# Patient Record
Sex: Male | Born: 1940 | Race: White | Hispanic: No | Marital: Married | Smoking: Former smoker
Health system: Southern US, Community
[De-identification: ages and names within clinical notes are randomized; demographics above are authoritative.]

## PROBLEM LIST (undated history)

## (undated) DIAGNOSIS — I10 Essential (primary) hypertension: Secondary | ICD-10-CM

## (undated) DIAGNOSIS — I2510 Atherosclerotic heart disease of native coronary artery without angina pectoris: Secondary | ICD-10-CM

## (undated) DIAGNOSIS — D649 Anemia, unspecified: Secondary | ICD-10-CM

## (undated) DIAGNOSIS — E039 Hypothyroidism, unspecified: Secondary | ICD-10-CM

## (undated) DIAGNOSIS — D689 Coagulation defect, unspecified: Secondary | ICD-10-CM

## (undated) DIAGNOSIS — G4733 Obstructive sleep apnea (adult) (pediatric): Secondary | ICD-10-CM

## (undated) DIAGNOSIS — E785 Hyperlipidemia, unspecified: Secondary | ICD-10-CM

## (undated) DIAGNOSIS — Z800 Family history of malignant neoplasm of digestive organs: Secondary | ICD-10-CM

## (undated) DIAGNOSIS — L409 Psoriasis, unspecified: Secondary | ICD-10-CM

## (undated) DIAGNOSIS — T7840XA Allergy, unspecified, initial encounter: Secondary | ICD-10-CM

## (undated) DIAGNOSIS — H269 Unspecified cataract: Secondary | ICD-10-CM

## (undated) DIAGNOSIS — K635 Polyp of colon: Secondary | ICD-10-CM

## (undated) DIAGNOSIS — H6120 Impacted cerumen, unspecified ear: Secondary | ICD-10-CM

## (undated) DIAGNOSIS — I219 Acute myocardial infarction, unspecified: Secondary | ICD-10-CM

## (undated) DIAGNOSIS — M1990 Unspecified osteoarthritis, unspecified site: Secondary | ICD-10-CM

## (undated) DIAGNOSIS — K5790 Diverticulosis of intestine, part unspecified, without perforation or abscess without bleeding: Secondary | ICD-10-CM

## (undated) DIAGNOSIS — G4730 Sleep apnea, unspecified: Secondary | ICD-10-CM

## (undated) HISTORY — DX: Anemia, unspecified: D64.9

## (undated) HISTORY — PX: POLYPECTOMY: SHX149

## (undated) HISTORY — DX: Unspecified cataract: H26.9

## (undated) HISTORY — DX: Coagulation defect, unspecified: D68.9

## (undated) HISTORY — DX: Sleep apnea, unspecified: G47.30

## (undated) HISTORY — DX: Impacted cerumen, unspecified ear: H61.20

## (undated) HISTORY — DX: Family history of malignant neoplasm of digestive organs: Z80.0

## (undated) HISTORY — DX: Hypothyroidism, unspecified: E03.9

## (undated) HISTORY — DX: Psoriasis, unspecified: L40.9

## (undated) HISTORY — PX: COLONOSCOPY: SHX174

## (undated) HISTORY — DX: Acute myocardial infarction, unspecified: I21.9

## (undated) HISTORY — PX: TONSILLECTOMY: SUR1361

## (undated) HISTORY — DX: Diverticulosis of intestine, part unspecified, without perforation or abscess without bleeding: K57.90

## (undated) HISTORY — DX: Unspecified osteoarthritis, unspecified site: M19.90

## (undated) HISTORY — PX: PTCA: SHX146

## (undated) HISTORY — DX: Obstructive sleep apnea (adult) (pediatric): G47.33

## (undated) HISTORY — DX: Atherosclerotic heart disease of native coronary artery without angina pectoris: I25.10

## (undated) HISTORY — DX: Polyp of colon: K63.5

## (undated) HISTORY — DX: Allergy, unspecified, initial encounter: T78.40XA

## (undated) HISTORY — DX: Hyperlipidemia, unspecified: E78.5

## (undated) HISTORY — DX: Essential (primary) hypertension: I10

---

## 1991-04-27 DIAGNOSIS — I219 Acute myocardial infarction, unspecified: Secondary | ICD-10-CM

## 1991-04-27 HISTORY — PX: CARDIAC CATHETERIZATION: SHX172

## 1991-04-27 HISTORY — DX: Acute myocardial infarction, unspecified: I21.9

## 2000-07-05 ENCOUNTER — Ambulatory Visit (HOSPITAL_BASED_OUTPATIENT_CLINIC_OR_DEPARTMENT_OTHER): Admission: RE | Admit: 2000-07-05 | Discharge: 2000-07-05 | Payer: Self-pay | Admitting: Pulmonary Disease

## 2001-02-27 ENCOUNTER — Ambulatory Visit (HOSPITAL_COMMUNITY): Admission: RE | Admit: 2001-02-27 | Discharge: 2001-02-27 | Payer: Self-pay | Admitting: Internal Medicine

## 2001-02-27 ENCOUNTER — Encounter: Payer: Self-pay | Admitting: Internal Medicine

## 2004-03-30 ENCOUNTER — Ambulatory Visit: Payer: Self-pay | Admitting: Internal Medicine

## 2004-04-07 ENCOUNTER — Ambulatory Visit: Payer: Self-pay | Admitting: Internal Medicine

## 2005-02-09 ENCOUNTER — Ambulatory Visit: Payer: Self-pay

## 2005-03-30 ENCOUNTER — Ambulatory Visit: Payer: Self-pay | Admitting: Internal Medicine

## 2005-04-08 ENCOUNTER — Ambulatory Visit: Payer: Self-pay | Admitting: Internal Medicine

## 2005-04-14 ENCOUNTER — Ambulatory Visit: Payer: Self-pay | Admitting: Cardiology

## 2005-04-16 ENCOUNTER — Ambulatory Visit (HOSPITAL_COMMUNITY): Admission: RE | Admit: 2005-04-16 | Discharge: 2005-04-16 | Payer: Self-pay | Admitting: Internal Medicine

## 2005-07-26 ENCOUNTER — Ambulatory Visit: Payer: Self-pay | Admitting: Gastroenterology

## 2005-08-11 ENCOUNTER — Ambulatory Visit: Payer: Self-pay | Admitting: Gastroenterology

## 2005-09-28 ENCOUNTER — Ambulatory Visit: Payer: Self-pay | Admitting: Cardiology

## 2005-09-28 ENCOUNTER — Inpatient Hospital Stay (HOSPITAL_COMMUNITY): Admission: EM | Admit: 2005-09-28 | Discharge: 2005-09-30 | Payer: Self-pay | Admitting: Emergency Medicine

## 2005-10-15 ENCOUNTER — Ambulatory Visit: Payer: Self-pay | Admitting: Cardiology

## 2005-10-20 ENCOUNTER — Ambulatory Visit: Payer: Self-pay | Admitting: Cardiology

## 2005-10-21 ENCOUNTER — Encounter (HOSPITAL_COMMUNITY): Admission: RE | Admit: 2005-10-21 | Discharge: 2006-01-19 | Payer: Self-pay | Admitting: Cardiology

## 2005-12-09 ENCOUNTER — Ambulatory Visit: Payer: Self-pay | Admitting: Cardiology

## 2005-12-15 ENCOUNTER — Ambulatory Visit: Payer: Self-pay | Admitting: Cardiology

## 2006-01-20 ENCOUNTER — Encounter (HOSPITAL_COMMUNITY): Admission: RE | Admit: 2006-01-20 | Discharge: 2006-01-24 | Payer: Self-pay | Admitting: Cardiology

## 2006-01-25 ENCOUNTER — Encounter (HOSPITAL_COMMUNITY): Admission: RE | Admit: 2006-01-25 | Discharge: 2006-04-25 | Payer: Self-pay | Admitting: Cardiology

## 2006-02-03 ENCOUNTER — Ambulatory Visit: Payer: Self-pay | Admitting: Cardiology

## 2006-02-14 ENCOUNTER — Ambulatory Visit: Payer: Self-pay | Admitting: Internal Medicine

## 2006-02-14 LAB — CONVERTED CEMR LAB
ALT: 31 units/L (ref 0–40)
AST: 30 units/L (ref 0–37)
Albumin: 3.8 g/dL (ref 3.5–5.2)
Alkaline Phosphatase: 48 units/L (ref 39–117)
BUN: 8 mg/dL (ref 6–23)
Basophils Absolute: 0 10*3/uL (ref 0.0–0.1)
Basophils Relative: 0.7 % (ref 0.0–1.0)
Bilirubin Urine: NEGATIVE
CO2: 31 meq/L (ref 19–32)
Calcium: 9.4 mg/dL (ref 8.4–10.5)
Chloride: 108 meq/L (ref 96–112)
Chol/HDL Ratio, serum: 3.1
Cholesterol: 102 mg/dL (ref 0–200)
Creatinine, Ser: 1.1 mg/dL (ref 0.4–1.5)
Eosinophil percent: 2 % (ref 0.0–5.0)
GFR calc non Af Amer: 72 mL/min
Glomerular Filtration Rate, Af Am: 87 mL/min/{1.73_m2}
Glucose, Bld: 91 mg/dL (ref 70–99)
HCT: 43.2 % (ref 39.0–52.0)
HDL: 32.8 mg/dL — ABNORMAL LOW (ref >39.0)
Hemoglobin: 14.3 g/dL (ref 13.0–17.0)
Ketones, ur: NEGATIVE mg/dL
LDL Cholesterol: 55 mg/dL (ref 0–99)
Leukocytes, UA: NEGATIVE
Lymphocytes Relative: 32 % (ref 12.0–46.0)
MCHC: 33 g/dL (ref 30.0–36.0)
MCV: 88.2 fL (ref 78.0–100.0)
Monocytes Absolute: 0.5 10*3/uL (ref 0.2–0.7)
Monocytes Relative: 9.8 % (ref 3.0–11.0)
Neutro Abs: 2.7 10*3/uL (ref 1.4–7.7)
Neutrophils Relative %: 55.5 % (ref 43.0–77.0)
Nitrite: NEGATIVE
PSA: 0.57 ng/mL (ref 0.10–4.00)
Platelets: 231 10*3/uL (ref 150–400)
Potassium: 4.7 meq/L (ref 3.5–5.1)
RBC: 4.9 M/uL (ref 4.22–5.81)
RDW: 13 % (ref 11.5–14.6)
Sodium: 143 meq/L (ref 135–145)
Specific Gravity, Urine: 1.015 (ref 1.000–1.03)
TSH: 0.35 microintl units/mL (ref 0.35–5.50)
Total Bilirubin: 1 mg/dL (ref 0.3–1.2)
Total Protein: 6.5 g/dL (ref 6.0–8.3)
Triglyceride fasting, serum: 72 mg/dL (ref 0–149)
Urine Glucose: NEGATIVE mg/dL
Urobilinogen, UA: 0.2 (ref 0.0–1.0)
VLDL: 14 mg/dL (ref 0–40)
WBC: 4.8 10*3/uL (ref 4.5–10.5)
pH: 7 (ref 5.0–8.0)

## 2006-02-16 ENCOUNTER — Ambulatory Visit: Payer: Self-pay | Admitting: Internal Medicine

## 2006-03-21 ENCOUNTER — Ambulatory Visit: Payer: Self-pay | Admitting: Cardiology

## 2006-03-21 LAB — CONVERTED CEMR LAB
ALT: 31 units/L (ref 0–40)
AST: 30 units/L (ref 0–37)
Albumin: 4 g/dL (ref 3.5–5.2)
Alkaline Phosphatase: 48 units/L (ref 39–117)
Bilirubin, Direct: 0.3 mg/dL (ref 0.0–0.3)
Chol/HDL Ratio, serum: 2.8
Cholesterol: 104 mg/dL (ref 0–200)
HDL: 37.4 mg/dL — ABNORMAL LOW (ref >39.0)
LDL Cholesterol: 56 mg/dL (ref 0–99)
Total Bilirubin: 1.4 mg/dL — ABNORMAL HIGH (ref 0.3–1.2)
Total Protein: 6.7 g/dL (ref 6.0–8.3)
Triglyceride fasting, serum: 52 mg/dL (ref 0–149)
VLDL: 10 mg/dL (ref 0–40)

## 2006-03-24 ENCOUNTER — Ambulatory Visit: Payer: Self-pay | Admitting: Cardiology

## 2006-04-13 ENCOUNTER — Ambulatory Visit: Payer: Self-pay

## 2006-04-14 ENCOUNTER — Ambulatory Visit: Payer: Self-pay | Admitting: Cardiology

## 2006-04-26 ENCOUNTER — Encounter (HOSPITAL_COMMUNITY): Admission: RE | Admit: 2006-04-26 | Discharge: 2006-07-25 | Payer: Self-pay | Admitting: Cardiology

## 2006-05-03 ENCOUNTER — Ambulatory Visit (HOSPITAL_COMMUNITY): Admission: RE | Admit: 2006-05-03 | Discharge: 2006-05-03 | Payer: Self-pay | Admitting: Cardiology

## 2006-05-03 ENCOUNTER — Ambulatory Visit: Payer: Self-pay | Admitting: Cardiology

## 2006-05-13 ENCOUNTER — Ambulatory Visit: Payer: Self-pay | Admitting: Cardiology

## 2006-06-16 ENCOUNTER — Ambulatory Visit: Payer: Self-pay | Admitting: Cardiology

## 2006-06-16 LAB — CONVERTED CEMR LAB
ALT: 30 units/L (ref 0–40)
AST: 29 units/L (ref 0–37)
Albumin: 4 g/dL (ref 3.5–5.2)
Alkaline Phosphatase: 52 units/L (ref 39–117)
Bilirubin, Direct: 0.2 mg/dL (ref 0.0–0.3)
Cholesterol: 106 mg/dL (ref 0–200)
HDL: 37.9 mg/dL — ABNORMAL LOW (ref >39.0)
LDL Cholesterol: 51 mg/dL (ref 0–99)
Total Bilirubin: 1.1 mg/dL (ref 0.3–1.2)
Total CHOL/HDL Ratio: 2.8
Total Protein: 7.1 g/dL (ref 6.0–8.3)
Triglycerides: 88 mg/dL (ref 0–149)
VLDL: 18 mg/dL (ref 0–40)

## 2006-06-23 ENCOUNTER — Ambulatory Visit: Payer: Self-pay | Admitting: Cardiology

## 2006-07-26 ENCOUNTER — Encounter (HOSPITAL_COMMUNITY): Admission: RE | Admit: 2006-07-26 | Discharge: 2006-10-24 | Payer: Self-pay | Admitting: Cardiology

## 2006-08-25 ENCOUNTER — Ambulatory Visit: Payer: Self-pay | Admitting: Cardiology

## 2006-08-25 LAB — CONVERTED CEMR LAB
ALT: 37 units/L (ref 0–40)
AST: 34 units/L (ref 0–37)
Albumin: 3.8 g/dL (ref 3.5–5.2)
Alkaline Phosphatase: 51 units/L (ref 39–117)
Bilirubin, Direct: 0.2 mg/dL (ref 0.0–0.3)
Cholesterol: 113 mg/dL (ref 0–200)
HDL: 40.2 mg/dL (ref >39.0)
LDL Cholesterol: 56 mg/dL (ref 0–99)
Total Bilirubin: 1.1 mg/dL (ref 0.3–1.2)
Total CHOL/HDL Ratio: 2.8
Total Protein: 6.5 g/dL (ref 6.0–8.3)
Triglycerides: 84 mg/dL (ref 0–149)
VLDL: 17 mg/dL (ref 0–40)

## 2006-09-22 ENCOUNTER — Ambulatory Visit: Payer: Self-pay | Admitting: Cardiology

## 2006-10-25 ENCOUNTER — Encounter (HOSPITAL_COMMUNITY): Admission: RE | Admit: 2006-10-25 | Discharge: 2006-11-24 | Payer: Self-pay | Admitting: Cardiology

## 2006-11-25 ENCOUNTER — Ambulatory Visit: Payer: Self-pay | Admitting: Cardiology

## 2006-11-25 LAB — CONVERTED CEMR LAB
ALT: 34 units/L (ref 0–53)
AST: 36 units/L (ref 0–37)
Albumin: 3.9 g/dL (ref 3.5–5.2)
Alkaline Phosphatase: 42 units/L (ref 39–117)
Bilirubin, Direct: 0.2 mg/dL (ref 0.0–0.3)
Cholesterol: 103 mg/dL (ref 0–200)
HDL: 37.5 mg/dL — ABNORMAL LOW (ref >39.0)
LDL Cholesterol: 52 mg/dL (ref 0–99)
Total Bilirubin: 1.7 mg/dL — ABNORMAL HIGH (ref 0.3–1.2)
Total CHOL/HDL Ratio: 2.7
Total Protein: 6.6 g/dL (ref 6.0–8.3)
Triglycerides: 66 mg/dL (ref 0–149)
VLDL: 13 mg/dL (ref 0–40)

## 2006-12-01 ENCOUNTER — Ambulatory Visit: Payer: Self-pay | Admitting: Internal Medicine

## 2007-02-02 ENCOUNTER — Encounter: Payer: Self-pay | Admitting: Internal Medicine

## 2007-02-02 DIAGNOSIS — I252 Old myocardial infarction: Secondary | ICD-10-CM

## 2007-02-02 DIAGNOSIS — I2510 Atherosclerotic heart disease of native coronary artery without angina pectoris: Secondary | ICD-10-CM

## 2007-02-20 ENCOUNTER — Ambulatory Visit: Payer: Self-pay | Admitting: Cardiology

## 2007-02-20 LAB — CONVERTED CEMR LAB
ALT: 28 units/L (ref 0–53)
Alkaline Phosphatase: 42 units/L (ref 39–117)
Cholesterol: 143 mg/dL (ref 0–200)
LDL Cholesterol: 81 mg/dL (ref 0–99)
Total Protein: 6.9 g/dL (ref 6.0–8.3)
VLDL: 17 mg/dL (ref 0–40)

## 2007-03-02 ENCOUNTER — Ambulatory Visit: Payer: Self-pay | Admitting: Cardiology

## 2007-03-09 ENCOUNTER — Ambulatory Visit: Payer: Self-pay

## 2007-03-28 ENCOUNTER — Ambulatory Visit: Payer: Self-pay | Admitting: Cardiology

## 2007-04-07 ENCOUNTER — Ambulatory Visit: Payer: Self-pay | Admitting: Internal Medicine

## 2007-04-09 LAB — CONVERTED CEMR LAB
ALT: 25 units/L (ref 0–53)
Albumin: 4 g/dL (ref 3.5–5.2)
Alkaline Phosphatase: 46 units/L (ref 39–117)
BUN: 8 mg/dL (ref 6–23)
Basophils Absolute: 0 10*3/uL (ref 0.0–0.1)
Basophils Relative: 0.1 % (ref 0.0–1.0)
Bilirubin Urine: NEGATIVE
CO2: 33 meq/L — ABNORMAL HIGH (ref 19–32)
Calcium: 9.5 mg/dL (ref 8.4–10.5)
Creatinine, Ser: 0.9 mg/dL (ref 0.4–1.5)
GFR calc Af Amer: 109 mL/min
Ketones, ur: NEGATIVE mg/dL
LDL Cholesterol: 55 mg/dL (ref 0–99)
Monocytes Relative: 8.1 % (ref 3.0–11.0)
Platelets: 193 10*3/uL (ref 150–400)
RDW: 12.6 % (ref 11.5–14.6)
Specific Gravity, Urine: 1.015 (ref 1.000–1.03)
Total Bilirubin: 1.2 mg/dL (ref 0.3–1.2)
Total CHOL/HDL Ratio: 2.6
Total Protein, Urine: NEGATIVE mg/dL
Total Protein: 6.7 g/dL (ref 6.0–8.3)
Triglycerides: 46 mg/dL (ref 0–149)
Urine Glucose: NEGATIVE mg/dL
VLDL: 9 mg/dL (ref 0–40)
pH: 6.5 (ref 5.0–8.0)

## 2007-04-14 ENCOUNTER — Ambulatory Visit: Payer: Self-pay | Admitting: Internal Medicine

## 2007-04-14 DIAGNOSIS — R361 Hematospermia: Secondary | ICD-10-CM

## 2007-04-14 DIAGNOSIS — E039 Hypothyroidism, unspecified: Secondary | ICD-10-CM | POA: Insufficient documentation

## 2007-04-14 DIAGNOSIS — J019 Acute sinusitis, unspecified: Secondary | ICD-10-CM

## 2007-04-14 DIAGNOSIS — E785 Hyperlipidemia, unspecified: Secondary | ICD-10-CM

## 2007-06-06 ENCOUNTER — Ambulatory Visit: Payer: Self-pay | Admitting: Internal Medicine

## 2007-06-06 LAB — CONVERTED CEMR LAB
Alkaline Phosphatase: 44 units/L (ref 39–117)
Bilirubin, Direct: 0.2 mg/dL (ref 0.0–0.3)
Cholesterol: 136 mg/dL (ref 0–200)
LDL Cholesterol: 74 mg/dL (ref 0–99)
Total CHOL/HDL Ratio: 2.9
Total Protein: 7 g/dL (ref 6.0–8.3)
Triglycerides: 74 mg/dL (ref 0–149)

## 2007-06-08 ENCOUNTER — Ambulatory Visit: Payer: Self-pay | Admitting: Cardiology

## 2007-08-18 ENCOUNTER — Telehealth: Payer: Self-pay | Admitting: Internal Medicine

## 2007-08-23 ENCOUNTER — Telehealth: Payer: Self-pay | Admitting: Internal Medicine

## 2007-10-03 ENCOUNTER — Ambulatory Visit: Payer: Self-pay | Admitting: Cardiology

## 2007-10-03 LAB — CONVERTED CEMR LAB
ALT: 27 units/L (ref 0–53)
AST: 26 units/L (ref 0–37)
Albumin: 3.9 g/dL (ref 3.5–5.2)
Alkaline Phosphatase: 44 units/L (ref 39–117)
Cholesterol: 128 mg/dL (ref 0–200)
HDL: 41.3 mg/dL (ref >39.0)
Total CHOL/HDL Ratio: 3.1
Total Protein: 6.8 g/dL (ref 6.0–8.3)
Triglycerides: 91 mg/dL (ref 0–149)

## 2007-10-05 ENCOUNTER — Ambulatory Visit: Payer: Self-pay | Admitting: Cardiology

## 2007-12-08 ENCOUNTER — Ambulatory Visit: Payer: Self-pay | Admitting: Internal Medicine

## 2007-12-08 DIAGNOSIS — L84 Corns and callosities: Secondary | ICD-10-CM

## 2007-12-08 DIAGNOSIS — J309 Allergic rhinitis, unspecified: Secondary | ICD-10-CM | POA: Insufficient documentation

## 2007-12-13 ENCOUNTER — Encounter (INDEPENDENT_AMBULATORY_CARE_PROVIDER_SITE_OTHER): Payer: Self-pay | Admitting: *Deleted

## 2007-12-20 ENCOUNTER — Encounter: Payer: Self-pay | Admitting: Internal Medicine

## 2008-03-25 ENCOUNTER — Ambulatory Visit: Payer: Self-pay | Admitting: Cardiology

## 2008-03-25 LAB — CONVERTED CEMR LAB
ALT: 33 units/L (ref 0–53)
AST: 33 units/L (ref 0–37)
Alkaline Phosphatase: 44 units/L (ref 39–117)
Bilirubin, Direct: 0.2 mg/dL (ref 0.0–0.3)
Cholesterol: 124 mg/dL (ref 0–200)
Total Bilirubin: 1 mg/dL (ref 0.3–1.2)
Total Protein: 6.9 g/dL (ref 6.0–8.3)

## 2008-03-28 ENCOUNTER — Ambulatory Visit: Payer: Self-pay | Admitting: Internal Medicine

## 2008-04-05 ENCOUNTER — Ambulatory Visit: Payer: Self-pay

## 2008-04-11 ENCOUNTER — Ambulatory Visit: Payer: Self-pay | Admitting: Cardiology

## 2008-05-28 ENCOUNTER — Ambulatory Visit: Payer: Self-pay | Admitting: Internal Medicine

## 2008-05-28 DIAGNOSIS — L408 Other psoriasis: Secondary | ICD-10-CM

## 2008-05-28 DIAGNOSIS — R7309 Other abnormal glucose: Secondary | ICD-10-CM

## 2008-05-28 DIAGNOSIS — K921 Melena: Secondary | ICD-10-CM | POA: Insufficient documentation

## 2008-06-07 ENCOUNTER — Telehealth: Payer: Self-pay | Admitting: Internal Medicine

## 2008-06-14 ENCOUNTER — Ambulatory Visit: Payer: Self-pay | Admitting: Internal Medicine

## 2008-06-17 LAB — CONVERTED CEMR LAB
BUN: 12 mg/dL (ref 6–23)
Bilirubin Urine: NEGATIVE
Calcium: 9.5 mg/dL (ref 8.4–10.5)
Eosinophils Absolute: 0.2 10*3/uL (ref 0.0–0.7)
Eosinophils Relative: 2.9 % (ref 0.0–5.0)
GFR calc Af Amer: 124 mL/min
GFR calc non Af Amer: 102 mL/min
Glucose, Bld: 109 mg/dL — ABNORMAL HIGH (ref 70–99)
HCT: 43.3 % (ref 39.0–52.0)
Hemoglobin, Urine: NEGATIVE
Hemoglobin: 14.9 g/dL (ref 13.0–17.0)
MCV: 91.6 fL (ref 78.0–100.0)
Monocytes Absolute: 0.6 10*3/uL (ref 0.1–1.0)
Monocytes Relative: 9.5 % (ref 3.0–12.0)
Neutro Abs: 3.7 10*3/uL (ref 1.4–7.7)
Nitrite: NEGATIVE
Platelets: 177 10*3/uL (ref 150–400)
Potassium: 4 meq/L (ref 3.5–5.1)
RDW: 12.6 % (ref 11.5–14.6)
Sodium: 142 meq/L (ref 135–145)
Total Protein, Urine: NEGATIVE mg/dL
Urine Glucose: NEGATIVE mg/dL
pH: 6.5 (ref 5.0–8.0)

## 2008-06-22 ENCOUNTER — Ambulatory Visit: Payer: Self-pay | Admitting: Family Medicine

## 2008-08-16 ENCOUNTER — Telehealth: Payer: Self-pay | Admitting: Internal Medicine

## 2008-09-25 ENCOUNTER — Ambulatory Visit: Payer: Self-pay | Admitting: Cardiology

## 2008-09-25 LAB — CONVERTED CEMR LAB
Albumin: 3.8 g/dL (ref 3.5–5.2)
HDL: 42.8 mg/dL (ref >39.00)
LDL Cholesterol: 65 mg/dL (ref 0–99)
Total Bilirubin: 1.2 mg/dL (ref 0.3–1.2)
Total CHOL/HDL Ratio: 3
Triglycerides: 98 mg/dL (ref 0.0–149.0)

## 2008-09-30 ENCOUNTER — Ambulatory Visit: Payer: Self-pay | Admitting: Cardiovascular Disease

## 2008-10-30 ENCOUNTER — Encounter: Payer: Self-pay | Admitting: Cardiology

## 2008-11-04 ENCOUNTER — Ambulatory Visit: Payer: Self-pay | Admitting: Internal Medicine

## 2009-01-22 ENCOUNTER — Encounter (INDEPENDENT_AMBULATORY_CARE_PROVIDER_SITE_OTHER): Payer: Self-pay | Admitting: *Deleted

## 2009-03-14 ENCOUNTER — Telehealth: Payer: Self-pay | Admitting: Cardiology

## 2009-04-01 ENCOUNTER — Ambulatory Visit: Payer: Self-pay | Admitting: Cardiology

## 2009-04-04 LAB — CONVERTED CEMR LAB
ALT: 31 units/L (ref 0–53)
Albumin: 3.9 g/dL (ref 3.5–5.2)
Bilirubin, Direct: 0.1 mg/dL (ref 0.0–0.3)
Cholesterol: 123 mg/dL (ref 0–200)
HDL: 41.1 mg/dL (ref >39.00)
LDL Cholesterol: 59 mg/dL (ref 0–99)
Total Protein: 6.9 g/dL (ref 6.0–8.3)
Triglycerides: 114 mg/dL (ref 0.0–149.0)
VLDL: 22.8 mg/dL (ref 0.0–40.0)

## 2009-04-07 ENCOUNTER — Ambulatory Visit: Payer: Self-pay | Admitting: Cardiovascular Disease

## 2009-05-02 ENCOUNTER — Telehealth: Payer: Self-pay | Admitting: Internal Medicine

## 2009-06-04 ENCOUNTER — Ambulatory Visit: Payer: Self-pay | Admitting: Internal Medicine

## 2009-06-04 DIAGNOSIS — Z87891 Personal history of nicotine dependence: Secondary | ICD-10-CM

## 2009-06-04 DIAGNOSIS — H6120 Impacted cerumen, unspecified ear: Secondary | ICD-10-CM

## 2009-06-04 DIAGNOSIS — H60399 Other infective otitis externa, unspecified ear: Secondary | ICD-10-CM | POA: Insufficient documentation

## 2009-06-04 HISTORY — DX: Impacted cerumen, unspecified ear: H61.20

## 2009-06-05 LAB — CONVERTED CEMR LAB
Albumin: 4.1 g/dL (ref 3.5–5.2)
BUN: 13 mg/dL (ref 6–23)
Basophils Relative: 0.3 % (ref 0.0–3.0)
Bilirubin, Direct: 0.2 mg/dL (ref 0.0–0.3)
Eosinophils Relative: 4.1 % (ref 0.0–5.0)
GFR calc non Af Amer: 78.94 mL/min (ref >60)
HCT: 45.6 % (ref 39.0–52.0)
HDL: 51.1 mg/dL (ref >39.00)
Monocytes Relative: 8.1 % (ref 3.0–12.0)
Platelets: 225 10*3/uL (ref 150.0–400.0)
Potassium: 3.9 meq/L (ref 3.5–5.1)
RBC: 4.92 M/uL (ref 4.22–5.81)
Sodium: 140 meq/L (ref 135–145)
Specific Gravity, Urine: 1.015 (ref 1.000–1.030)
Total CHOL/HDL Ratio: 3
Total Protein, Urine: NEGATIVE mg/dL
Total Protein: 7.7 g/dL (ref 6.0–8.3)
Triglycerides: 110 mg/dL (ref 0.0–149.0)
Urine Glucose: NEGATIVE mg/dL
WBC: 8.9 10*3/uL (ref 4.5–10.5)

## 2009-09-24 ENCOUNTER — Telehealth: Payer: Self-pay | Admitting: Internal Medicine

## 2009-10-06 ENCOUNTER — Telehealth: Payer: Self-pay | Admitting: Internal Medicine

## 2009-10-07 ENCOUNTER — Telehealth: Payer: Self-pay | Admitting: Cardiology

## 2010-01-19 ENCOUNTER — Telehealth: Payer: Self-pay | Admitting: Cardiology

## 2010-02-10 ENCOUNTER — Ambulatory Visit: Payer: Self-pay | Admitting: Cardiology

## 2010-02-27 ENCOUNTER — Ambulatory Visit: Payer: Self-pay | Admitting: Cardiology

## 2010-02-27 ENCOUNTER — Encounter (INDEPENDENT_AMBULATORY_CARE_PROVIDER_SITE_OTHER): Payer: Self-pay | Admitting: *Deleted

## 2010-02-27 ENCOUNTER — Ambulatory Visit: Payer: Self-pay

## 2010-02-27 DIAGNOSIS — I10 Essential (primary) hypertension: Secondary | ICD-10-CM

## 2010-03-02 ENCOUNTER — Telehealth: Payer: Self-pay | Admitting: Cardiology

## 2010-03-02 ENCOUNTER — Encounter: Payer: Self-pay | Admitting: Cardiology

## 2010-03-06 ENCOUNTER — Ambulatory Visit: Payer: Self-pay | Admitting: Cardiology

## 2010-03-06 LAB — CONVERTED CEMR LAB
BUN: 12 mg/dL (ref 6–23)
Basophils Relative: 0.4 % (ref 0.0–3.0)
Calcium: 9.6 mg/dL (ref 8.4–10.5)
Creatinine, Ser: 0.9 mg/dL (ref 0.4–1.5)
Eosinophils Absolute: 0.5 10*3/uL (ref 0.0–0.7)
Eosinophils Relative: 7 % — ABNORMAL HIGH (ref 0.0–5.0)
INR: 1 (ref 0.8–1.0)
Lymphocytes Relative: 28.1 % (ref 12.0–46.0)
Neutrophils Relative %: 55.1 % (ref 43.0–77.0)
Platelets: 192 10*3/uL (ref 150.0–400.0)
Prothrombin Time: 10.6 s (ref 9.7–11.8)
RBC: 4.73 M/uL (ref 4.22–5.81)
WBC: 7.5 10*3/uL (ref 4.5–10.5)
aPTT: 26.3 s (ref 21.7–28.8)

## 2010-03-10 ENCOUNTER — Ambulatory Visit: Payer: Self-pay | Admitting: Cardiology

## 2010-03-10 ENCOUNTER — Inpatient Hospital Stay (HOSPITAL_BASED_OUTPATIENT_CLINIC_OR_DEPARTMENT_OTHER): Admission: RE | Admit: 2010-03-10 | Discharge: 2010-03-10 | Payer: Self-pay | Admitting: Cardiology

## 2010-03-27 ENCOUNTER — Ambulatory Visit: Payer: Self-pay | Admitting: Cardiology

## 2010-05-01 ENCOUNTER — Telehealth: Payer: Self-pay | Admitting: Cardiology

## 2010-05-28 NOTE — Assessment & Plan Note (Signed)
Summary: rov   Visit Type:  Follow-up Primary Sheranda Seabrooks:  Tresa Garter MD   History of Present Illness: Mr. Kearse is 70 years old and return for management of CAD. He has long-standing chronic total occlusion of the right coronary artery. Several years ago he had cutting balloon and plasty of the diagonal branch LAD which later occluded. His last catheterization was in December of 2010 at which time he had 70-80% stenosis in the LAD total occlusion of diagonal branch of the LAD and total occlusion the right coronary. We considered surgery but elected to continue medical therapy.  He has done very well since that time. He said no chest pain and no palpitations. He's working out on a treadmill regularly for 30-45 minutes walking and jogging.  His other problems include hypertension and hyperlipidemia. His HDL is good his LDL is down to 60 on Simcor.  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  He knows Dr. Eden Emms and we will plan to transfer his care to Dr. Eden Emms when he follows up next year.  Current Medications (verified): 1)  Synthroid 100 Mcg  Tabs (Levothyroxine Sodium) .... Once Daily 2)  Toprol Xl 25 Mg  Tb24 (Metoprolol Succinate) .... Once Daily 3)  Centrum Silver   Tabs (Multiple Vitamins-Minerals) .... Once Daily 4)  Simcor 1000-20 Mg  Tb24 (Niacin-Simvastatin) .... 2 By Mouth Qhs 5)  Aspir-Low 81 Mg Tbec (Aspirin) .[name].. 1 Once Daily After Meal 6)  Plavix 75 Mg Tabs (Clopidogrel Bisulfate) .... Take 1 Tab By Mouth Daily 7)  Fish Oil   Oil (Fish Oil) .... 2 By Mouth Qd 8)  Elocon 0.1 %  Crea (Mometasone Furoate) .... Use Bid 9)  Vitamin D3 1000 Unit  Tabs (Cholecalciferol) .[name].. 1 By Mouth Daily 10)  Mometasone Furoate 0.1 % Crea (Mometasone Furoate) .... Use Bid 11)  Halobetasol Propionate 0.05 % Crea (Halobetasol Propionate) .... Use Two Times A Day Prn  Allergies (verified): No Known Drug Allergies  Past  History:  Past Medical History: Reviewed history from 05/28/2008 and no changes required. Coronary artery disease - 70% LAD, 100% RCA Dr Juanda Chance Myocardial infarction, hx of Hyperlipidemia Hypothyroidism Allergic rhinitis ? OSA Psoriasis Dr Deirdre Evener h/o colon ca Dr Jarold Motto  Review of Systems       ROS is negative except as outlined in HPI.   Vital Signs:  Patient profile:   70 year old male Height:      73 inches Weight:      216 pounds BMI:     28.60 Pulse rate:   58 / minute BP sitting:   140 / 74  (left arm) Cuff size:   large  Vitals Entered By: Burnett Kanaris, CNA (February 10, 2010 9:58 AM)  Physical Exam  Additional Exam:  Gen. Well-nourished, in no distress   Neck: No JVD, thyroid not enlarged, no carotid bruits Lungs: No tachypnea, clear without rales, rhonchi or wheezes Cardiovascular: Rhythm regular, PMI not displaced,  heart sounds  normal, no murmurs or gallops, no peripheral edema, pulses normal in all 4 extremities. Abdomen: BS normal, abdomen soft and non-tender without masses or organomegaly, no hepatosplenomegaly. MS: No deformities, no cyanosis or clubbing   Neuro:  No focal sns   Skin:  no lesions    Impression & Recommendations:  Problem # 1:  CORONARY ARTERY DISEASE (ICD-414.00) He has CAD as described in the history of present illness. He has not  had any symptoms and appears stable. However he may have silent ischemia and we will plan to about him with a stress test in the next few weeks. His updated medication list for this problem includes:    Toprol Xl 25 Mg Tb24 (Metoprolol succinate) ..... Once daily    Aspir-low 81 Mg Tbec (Aspirin) .[name]... 1 once daily after meal    Plavix 75 Mg Tabs (Clopidogrel bisulfate) .[name]... Take 1 tab by mouth daily  Orders: EKG w/ Interpretation (93000) Treadmill (Treadmill)  Problem # 2:  HYPERLIPIDEMIA (ICD-272.4) His last lipid panel was very good with an LDL of 60. He is scheduled to have this repeated with  his primary care physician in the near future. His updated medication list for this problem includes:    Simcor 1000-20 Mg Tb24 (Niacin-simvastatin) .[name]... 2 by mouth qhs  Patient Instructions: 1)  Your physician has requested that you have an exercise tolerance test.  For further information please visit [email or web address]  Please also follow instruction sheet, as given. 2)  Your physician recommends that you continue on your current medications as directed. Please refer to the Current Medication list given to you today.

## 2010-05-28 NOTE — Progress Notes (Signed)
Summary: Med refill  Phone Note Refill Request Message from:  Fax from Pharmacy on September 24, 2009 2:20 PM  Refills Requested: Medication #1:  SYNTHROID 100 MCG  TABS once daily  Medication #2:  TOPROL XL 25 MG  TB24 once daily Initial call taken by: Lucious Groves,  September 24, 2009 2:20 PM    Prescriptions: SYNTHROID 100 MCG  TABS (LEVOTHYROXINE SODIUM) once daily  #90 x 3   Entered by:   Lucious Groves   Authorized by:   Tresa Garter MD   Signed by:   Lucious Groves on 09/24/2009   Method used:   Faxed to ...       [facility] (mail-order)             ,          Ph: [phone]       Fax: [phone]   RxID:   [id number]

## 2010-05-28 NOTE — Cardiovascular Report (Signed)
Summary: Pre Cath Orders   Pre Cath Orders   Imported By: Roderic Ovens 03/16/2010 16:40:02  _____________________________________________________________________  External Attachment:    Type:   Image     Comment:   External Document

## 2010-05-28 NOTE — Letter (Signed)
Summary: Cardiac Catheterization Instructions- [facility]  [facility], Main Office  [address]   [place], [place] [address]   Phone: [phone]  Fax: [phone]     02/27/2010 MRN: [id number]  JIOVANY SCHEFFEL [address] [place], [place]  [address]  Dear Mr. JORGENSEN,   You are scheduled for a Cardiac Catheterization on _________ with Dr. Juanda Chance  Please arrive to the 1st floor of the Heart and Vascular Center at [facility] at _____ am / pm on the day of your procedure. Please do not arrive before 6:30 a.m. Call the Heart and Vascular Center at [phone] if you are unable to make your appointmnet. The Code to get into the parking garage under the building is 2000. Take the elevators to the 1st floor. You must have someone to drive you home. Someone must be with you for the first 24 hours after you arrive home. Please wear clothes that are easy to get on and off and wear slip-on shoes. Do not eat or drink after midnight except water with your medications that morning. Bring all your medications and current insurance cards with you.  ___ DO NOT take these medications before your procedure: ________________________________________________________________  _x__ Make sure you take your aspirin.  _x__ You may take ALL of your medications with water that morning. ________________________________________________________________________________________________________________________________  Bonita Quin will need to have labwork and a chest x-ray done prior to your heart cath. Labwork will need to be between 03/03/10- 03/06/10. You may have this done at our office or the [place] office on [facility]. We are no longer doing chest x-rays in our office. You have been given an order for this and may have this done at the [place] office on [facility]. or at [facility].  The usual length of stay after your procedure is 2 to 3 hours. This can vary.  If you have any  questions, please call the office at the number listed above.   Sherri Rad, RN, BSN

## 2010-05-28 NOTE — Progress Notes (Signed)
Summary: rx refill  Phone Note Refill Request Message from:  Patient on May 01, 2010 10:27 AM  Refills Requested: Medication #1:  SIMCOR 1000-20 MG  TB24 2 by mouth qhs [facility]# [phone]    Method Requested: Telephone to Pharmacy Initial call taken by: Roe Coombs,  May 01, 2010 10:28 AM    Prescriptions: [facility] 1000-20 MG  TB24 (NIACIN-SIMVASTATIN) 2 by mouth qhs  #180 x 3   Entered by:   Burnett Kanaris, CNA   Authorized by:   Colon Branch, MD, [facility]   Signed by:   Burnett Kanaris, CNA on 05/01/2010   Method used:   Faxed to ...       [facility] (mail-order)             , [place]         Ph: [phone]       Fax: [phone]   RxID:   [id number]

## 2010-05-28 NOTE — Progress Notes (Signed)
Summary: PLAVIX DONE DAJ  Phone Note Refill Request Call back at Home Phone [phone] Message from:  Patient on October 07, 2009 4:58 PM  Refills Requested: Medication #1:  PLAVIX 75 MG TABS Take 1 tab by mouth daily FAX TO [facility] @1 [phone] REQ 3 MOS AT A TIME FOR 1 YEAR   Method Requested: Fax to Local Pharmacy Initial call taken by: Glynda Jaeger,  October 07, 2009 4:59 PM Caller: Patient    Prescriptions: PLAVIX 75 MG TABS (CLOPIDOGREL BISULFATE) Take 1 tab by mouth daily  #90 x 3   Entered by:   Burnett Kanaris, CNA   Authorized by:   Lenoria Farrier, MD, [facility]   Signed by:   Burnett Kanaris, CNA on 10/08/2009   Method used:   Faxed to ...       [facility] (mail-order)             , [place]         Ph: [phone]       Fax: [phone]   RxID:   [id number]

## 2010-05-28 NOTE — Progress Notes (Signed)
  Phone Note Refill Request Message from:  Fax from Pharmacy on October 06, 2009 10:11 AM  Refills Requested: Medication #1:  TOPROL XL 25 MG  TB24 once daily Initial call taken by: Ami Bullins CMA,  October 06, 2009 10:11 AM    Prescriptions: TOPROL XL 25 MG  TB24 (METOPROLOL SUCCINATE) once daily  #90 x 3   Entered by:   Ami Bullins CMA   Authorized by:   Tresa Garter MD   Signed by:   Bill Salinas CMA on 10/06/2009   Method used:   Electronically to        [facility]* (retail)             ,          Ph: [phone]       Fax: [phone]   RxID:   [id number]

## 2010-05-28 NOTE — Assessment & Plan Note (Signed)
Summary: [facility]   Primary Provider:  Georgina Quint Plotnikov MD   History of Present Illness: [name] [name] is 29 years and returned to her visit and treadmill today. He has documented coronary disease and has chronic occlusion of the right coronary had cutting balloon and passed to the diagonal branch of the LAD. At last catheterization in 2008 he had total right coronary artery, total occlusion and abdomen to be intubated sent during in the LAD. We consulted surgery but he would try medical therapy. He's been very on the treadmill and taken to protect himself. He's had no chest pain.  On treadmill testing today he did fairly well and completed one minute of stage IV [name] protocol but there was significant ST depression of 2 mm in the anterolateral leads which was slightly worse than his previous tracing.  Allergies: No Known Drug Allergies  Past History:  Past Medical History: Reviewed history from 05/28/2008 and no changes required. Coronary artery disease - 70% LAD, 100% RCA Dr Juanda Chance Myocardial infarction, hx of Hyperlipidemia Hypothyroidism Allergic rhinitis ? OSA Psoriasis Dr Deirdre Evener h/o colon ca Dr Jarold Motto  Review of Systems       ROS is negative except as outlined in HPI.   Physical Exam  Additional Exam:  Gen. Well-nourished, in no distress   Neck: No JVD, thyroid not enlarged, no carotid bruits Lungs: No tachypnea, clear without rales, rhonchi or wheezes Cardiovascular: Rhythm regular, PMI not displaced,  heart sounds  normal, no murmurs or gallops, no peripheral edema, pulses normal in all 4 extremities. Abdomen: BS normal, abdomen soft and non-tender without masses or organomegaly, no hepatosplenomegaly. MS: No deformities, no cyanosis or clubbing   Neuro:  No focal sns   Skin:  no lesions    Impression & Recommendations:  Problem # 1:  CORONARY ARTERY DISEASE (ICD-414.00) He has CAD as described in history of present illness. His exercise test  shows some white matter ST depression than his exercise test 2 years ago at which time he had a scan. He has a rash back and then I'm concerned about the risk of sudden cardiac event. I think the best way to stratify him as was a repeat cath and we'll plan to set this up in the JVD. I likely options would be continued medical therapy or bypass surgery. His updated medication list for this problem includes:    Toprol Xl 25 Mg Tb24 (Metoprolol succinate) ..... Once daily    Aspir-low 81 Mg Tbec (Aspirin) .[name]... 1 once daily after meal    Plavix 75 Mg Tabs (Clopidogrel bisulfate) .[name]... Take 1 tab by mouth daily  Problem # 2:  HYPERLIPIDEMIA (ICD-272.4) This is managed with syncope His updated medication list for this problem includes:    Simcor 1000-20 Mg Tb24 (Niacin-simvastatin) .[name]... 2 by mouth qhs  Problem # 3:  HYPERTENSION, BENIGN (ICD-401.1) This is well controlled on current medications. His updated medication list for this problem includes:    Toprol Xl 25 Mg Tb24 (Metoprolol succinate) ..... Once daily    Aspir-low 81 Mg Tbec (Aspirin) .[name]... 1 once daily after meal

## 2010-05-28 NOTE — Progress Notes (Signed)
Summary: halobetasol  Phone Note Other Incoming Call back at fax   Caller: [facility] Details for Reason: refill on halobetasol Details of Action Taken: ok #90  with no refills/vg Initial call taken by: Tora Perches,  May 02, 2009 11:52 AM    Prescriptions: HALOBETASOL PROPIONATE 0.05 % CREA (HALOBETASOL PROPIONATE) use two times a day prn  #45 g x 0   Entered by:   Tora Perches   Authorized by:   Tresa Garter MD   Signed by:   Tora Perches on 05/02/2009   Method used:   Faxed to ...       [facility] (mail-order)             ,          Ph: [phone]       Fax: [phone]   RxID:   [id number]

## 2010-05-28 NOTE — Assessment & Plan Note (Signed)
Summary: f53m   Visit Type:  1 mo f/u Primary Provider:  Tresa Garter MD  CC:  pt offers no cardiac complaints today.  History of Present Illness: [name] [name] is 70 years and returned to her visit after his recent catheter procedure.. He has documented coronary disease and has chronic occlusion of the right coronary had cutting balloon and passed to the diagonal branch of the LAD. We recently did a treadmill test which showed slightly more ST depression so he brought him in for catheterization. At catheterization his anatomy had not changed over the past several years. A total occlusion of the right coronary artery and a 70-70% heavily calcified lesion LAD. We elected to continue medical therapy.  He is not a great job of taking care of himself. He exercises regularly and he can follow him at the clinic and has his lipids at target.  Current Medications (verified): 1)  Synthroid 100 Mcg  Tabs (Levothyroxine Sodium) .... Once Daily 2)  Toprol Xl 25 Mg  Tb24 (Metoprolol Succinate) .... Once Daily 3)  Centrum Silver   Tabs (Multiple Vitamins-Minerals) .... Once Daily 4)  Simcor 1000-20 Mg  Tb24 (Niacin-Simvastatin) .... 2 By Mouth Qhs 5)  Aspir-Low 81 Mg Tbec (Aspirin) .[name].. 1 Once Daily After Meal 6)  Plavix 75 Mg Tabs (Clopidogrel Bisulfate) .... Take 1 Tab By Mouth Daily 7)  Fish Oil 1200 Mg Caps (Omega-3 Fatty Acids) .[name].. 1 Cap Two Times A Day 8)  Elocon 0.1 %  Crea (Mometasone Furoate) .... Use Two Times A Day As Needed 9)  Vitamin D3 1000 Unit  Tabs (Cholecalciferol) .[name].. 1 By Mouth Daily  Allergies (verified): No Known Drug Allergies  Past History:  Past Medical History: Reviewed history from 05/28/2008 and no changes required. Coronary artery disease - 70% LAD, 100% RCA Dr Juanda Chance Myocardial infarction, hx of Hyperlipidemia Hypothyroidism Allergic rhinitis ? OSA Psoriasis Dr Deirdre Evener h/o colon ca Dr Jarold Motto  Review of Systems       ROS is negative except as  outlined in HPI.   Vital Signs:  Patient profile:   70 year old male Height:      73 inches Weight:      220.50 pounds BMI:     29.20 Pulse rate:   60 / minute Pulse rhythm:   regular BP sitting:   134 / 66  (left arm) Cuff size:   large  Vitals Entered By: Danielle Rankin, CMA (March 27, 2010 9:59 AM)  Physical Exam  Additional Exam:  Gen. Well-nourished, in no distress   Neck: No JVD, thyroid not enlarged, no carotid bruits Lungs: No tachypnea, clear without rales, rhonchi or wheezes Cardiovascular: Rhythm regular, PMI not displaced,  heart sounds  normal, no murmurs or gallops, no peripheral edema, pulses normal in all 4 extremities. Abdomen: BS normal, abdomen soft and non-tender without masses or organomegaly, no hepatosplenomegaly. MS: No deformities, no cyanosis or clubbing   Neuro:  No focal sns   Skin:  no lesions    Impression & Recommendations:  Problem # 1:  CORONARY ARTERY DISEASE (ICD-414.00) His recent catheterization showed no progression of disease. Has a total right and a 70-80% proximal to mid LAD. His ejection fraction is 60%. He's not had any anginal symptoms. We'll plan to continue medical therapy. He's been a great job with his therapy with exercising regularly and his lipids have been in target.  We will plan followup with Dr. Eden Emms in one year.  His updated medication  list for this problem includes:    Toprol Xl 25 Mg Tb24 (Metoprolol succinate) ..... Once daily    Aspir-low 81 Mg Tbec (Aspirin) .[name]... 1 once daily after meal    Plavix 75 Mg Tabs (Clopidogrel bisulfate) .[name]... Take 1 tab by mouth daily  Problem # 2:  HYPERLIPIDEMIA (ICD-272.4) His last lipid profile was at target. He is scheduled for repeat lipid profile with his primary care physician in January. His updated medication list for this problem includes:    Simcor 1000-20 Mg Tb24 (Niacin-simvastatin) .[name]... 2 by mouth qhs  Patient Instructions: 1)  Your physician recommends that you  continue on your current medications as directed. Please refer to the Current Medication list given to you today. 2)  Your physician wants you to follow-up in: 1 year with Dr. Eden Emms.  You will receive a reminder letter in the mail two months in advance. If you don't receive a letter, please call our office to schedule the follow-up appointment.

## 2010-05-28 NOTE — Progress Notes (Signed)
Summary: WOULD LIKE TO SCHEDULE STRESS TEST   Phone Note Call from Patient Call back at Work Phone [phone]   Caller: PT Reason for Call: Talk to Nurse Summary of Call: PT WOULD LIKE TO GET HIS STRESS TEST DONE THAT WAS ORDERED FOR 03/2009. Initial call taken by: Faythe Ghee,  January 19, 2010 1:50 PM  Follow-up for Phone Call        I left Charmaine a message to check on precert. Sherri Rad, RN, BSN  January 19, 2010 2:27 PM  Per Suan Halter- no precert required, but it may be that Medicare will still not pay the total cost without an office visit or without the pt. being seen first. I have left a message for the pt to call. Sherri Rad, RN, BSN  January 20, 2010 10:16 AM   I spoke with the pt and made him aware of the above. He will come  in for an office visit with Dr. Juanda Chance first. He is scheduled for an office visit on 02/10/10. Follow-up by: Sherri Rad, RN, BSN,  January 20, 2010 12:22 PM

## 2010-05-28 NOTE — Progress Notes (Signed)
Summary: Cath date & time   Phone Note Outgoing Call   Call placed by: Sherri Rad, RN, BSN,  March 02, 2010 9:41 AM Call placed to: Patient Summary of Call: [facility] regarding cath date and time. Sherri Rad, RN, BSN  March 02, 2010 9:42 AM  I spoke with the pt. Initial call taken by: Sherri Rad, RN, BSN,  March 02, 2010 3:32 PM

## 2010-05-28 NOTE — Assessment & Plan Note (Signed)
Summary: YEARLY FU/  MEDICARE / LABS SAME DAY /NWS #   Vital Signs:  Patient profile:   70 year old male Weight:      222 pounds Temp:     98.2 degrees F oral Pulse rate:   62 / minute BP sitting:   126 / 76  (left arm)  Vitals Entered By: Tora Perches (June 04, 2009 8:24 AM) CC: cpx Is Patient Diabetic? No   Primary Care Provider:  Tresa Garter MD  CC:  cpx.  History of Present Illness: C/o sinus congestion x 1 wk - took Ceftin 10 d - now L cheek and ear pain and cough The patient presents for a wellness examination   Preventive Screening-Counseling & Management  Alcohol-Tobacco     Smoking Status: quit  Current Medications (verified): 1)  Synthroid 100 Mcg  Tabs (Levothyroxine Sodium) .... Once Daily 2)  Toprol Xl 25 Mg  Tb24 (Metoprolol Succinate) .... Once Daily 3)  Centrum Silver   Tabs (Multiple Vitamins-Minerals) .... Once Daily 4)  Simcor 1000-20 Mg  Tb24 (Niacin-Simvastatin) .... 2 By Mouth Qhs 5)  Aspir-Low 81 Mg Tbec (Aspirin) .[name].. 1 Once Daily After Meal 6)  Plavix 75 Mg Tabs (Clopidogrel Bisulfate) .... Take 1 Tab By Mouth Daily 7)  Fish Oil   Oil (Fish Oil) .... 2 By Mouth Qd 8)  Elocon 0.1 %  Crea (Mometasone Furoate) .... Use Bid 9)  Vitamin D3 1000 Unit  Tabs (Cholecalciferol) .[name].. 1 By Mouth Daily 10)  Mometasone Furoate 0.1 % Crea (Mometasone Furoate) .... Use Bid 11)  Halobetasol Propionate 0.05 % Crea (Halobetasol Propionate) .... Use Two Times A Day Prn  Allergies (verified): No Known Drug Allergies  Past History:  Past Medical History: Last updated: 05/28/2008 Coronary artery disease - 70% LAD, 100% RCA Dr Juanda Chance Myocardial infarction, hx of Hyperlipidemia Hypothyroidism Allergic rhinitis ? 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         ST  Physical Exam  General:  alert, well-developed, well-nourished, well-hydrated, cooperative to examination, and good hygiene.   Head:  Normocephalic and atraumatic without obvious abnormalities. No apparent alopecia or balding. Eyes:  No corneal or conjunctival inflammation noted. EOMI. Perrla Ears:  wax L>>R swelling in L ear canal  Nose:  WNL Mouth:  WNL Neck:  No deformities, masses, or tenderness noted. Lungs:  Normal respiratory effort, chest expands symmetrically. Lungs are clear to auscultation, no crackles or wheezes. Heart:  Normal rate and regular rhythm. S1 and S2 normal without gallop, murmur, click, rub or other extra sounds. Abdomen:  Bowel sounds positive,abdomen soft and non-tender without masses, organomegaly or hernias noted. Rectal:  No external abnormalities noted. Normal sphincter tone. No rectal masses or tenderness. Genitalia:  Testes bilaterally descended without nodularity, tenderness or masses. No scrotal masses or lesions. No penis lesions or urethral discharge. Prostate:  1+ enlarged.   Msk:  No deformity or scoliosis noted of thoracic or lumbar spine.   Pulses:  R and L carotid,radial,femoral,dorsalis  pedis and posterior tibial pulses are full and equal bilaterally Extremities:  No clubbing, cyanosis, edema, or deformity noted with normal full range of motion of all joints.   Neurologic:  No cranial nerve deficits noted. Station and gait are normal. Plantar reflexes are down-going bilaterally. DTRs are symmetrical throughout. Sensory, motor and coordinative functions appear intact. Skin:  R shin Cervical Nodes:  No lymphadenopathy noted Inguinal Nodes:  No significant adenopathy Psych:  Cognition and judgment appear intact. Alert and cooperative with normal attention span and concentration. No apparent delusions, illusions, hallucinations   Impression & Recommendations:  Problem # 1:  WELL ADULT EXAM (ICD-V70.0) Assessment New The labs were reviewed with the patient.  Health and age related issues were discussed. Available screening tests and vaccinations were discussed as well. Healthy life style including good diet and execise was discussed.  Orders: Gastroenterology Referral (GI) TLB-BMP (Basic Metabolic Panel-BMET) (80048-METABOL) TLB-CBC Platelet - w/Differential (85025-CBCD) TLB-Hepatic/Liver Function Pnl (80076-HEPATIC) TLB-Lipid Panel (80061-LIPID) TLB-TSH (Thyroid Stimulating Hormone) (84443-TSH) TLB-PSA (Prostate Specific Antigen) (84153-PSA) TLB-Udip ONLY (81003-UDIP)  Problem # 2:  CERUMEN IMPACTION (ICD-380.4) L>>R Assessment: Deteriorated removed  Problem # 3:  OTITIS EXTERNA (ICD-380.10) L Assessment: New  His updated medication list for this problem includes:    Cortisporin 3.5-10000-1 Soln (Neomycin-polymyxin-hc) .Marland KitchenMarland KitchenMarland KitchenMarland Kitchen 3 gtt in l ear tid  Problem # 4:  PSORIASIS NEC (ICD-696.1) Assessment: New Rx given  Complete Medication List: 1)  Synthroid 100 Mcg Tabs (Levothyroxine sodium) .... Once daily 2)  Toprol Xl 25 Mg Tb24 (Metoprolol succinate) .... Once daily 3)  Centrum Silver Tabs (Multiple vitamins-minerals) .... Once daily 4)  Simcor 1000-20 Mg Tb24  (Niacin-simvastatin) .... 2 by mouth qhs 5)  Aspir-low 81 Mg Tbec (Aspirin) .[name].. 1 once daily after meal 6)  Plavix 75 Mg Tabs (Clopidogrel bisulfate) .... Take 1 tab by mouth daily 7)  Fish Oil Oil (Fish oil) .... 2 by mouth qd 8)  Elocon 0.1 % Crea (Mometasone furoate) .... Use bid 9)  Vitamin D3 1000 Unit Tabs (Cholecalciferol) .[name].. 1 by mouth daily 10)  Mometasone Furoate 0.1 % Crea (Mometasone furoate) .... Use bid 11)  Halobetasol Propionate 0.05 % Crea (Halobetasol propionate) .... Use two times a day prn  Other Orders: Pneumococcal Vaccine (16109) Admin 1st Vaccine (60454) Admin 1st Vaccine [facility]) [phone])  Patient Instructions: 1)  Please schedule a follow-up appointment in 1 year well w/labs. Prescriptions: CORTISPORIN 3.5-10000-1 SOLN (NEOMYCIN-POLYMYXIN-HC) 3 gtt in L ear tid  #1 x 1   Entered and Authorized by:   Tresa Garter MD   Signed by:   Tresa Garter MD on 06/04/2009   Method used:   Electronically to        [id number] (retail)       [address]       [place], [place]  [address]       Ph: [phone] or [phone]       Fax: [phone]   RxID:   [id number]    Pneumovax Vaccine    Vaccine Type: Pneumovax    Site: left deltoid    Mfr: Merck    Dose: 0.5 ml    Route: IM    Given by: Tora Perches  Exp. Date: 04/23/2011    Lot #: [id number]    VIS given: 11/22/95 version given June 04, 2009.

## 2010-06-05 ENCOUNTER — Encounter: Payer: Self-pay | Admitting: Internal Medicine

## 2010-06-05 ENCOUNTER — Encounter (INDEPENDENT_AMBULATORY_CARE_PROVIDER_SITE_OTHER): Payer: Medicare Other | Admitting: Internal Medicine

## 2010-06-05 DIAGNOSIS — Z0000 Encounter for general adult medical examination without abnormal findings: Secondary | ICD-10-CM

## 2010-06-05 DIAGNOSIS — Z2911 Encounter for prophylactic immunotherapy for respiratory syncytial virus (RSV): Secondary | ICD-10-CM

## 2010-06-05 DIAGNOSIS — Z23 Encounter for immunization: Secondary | ICD-10-CM

## 2010-06-08 ENCOUNTER — Other Ambulatory Visit: Payer: Self-pay | Admitting: Internal Medicine

## 2010-06-08 ENCOUNTER — Encounter (INDEPENDENT_AMBULATORY_CARE_PROVIDER_SITE_OTHER): Payer: Self-pay | Admitting: *Deleted

## 2010-06-08 ENCOUNTER — Other Ambulatory Visit: Payer: Medicare Other

## 2010-06-08 DIAGNOSIS — Z0389 Encounter for observation for other suspected diseases and conditions ruled out: Secondary | ICD-10-CM

## 2010-06-08 DIAGNOSIS — R7309 Other abnormal glucose: Secondary | ICD-10-CM

## 2010-06-08 DIAGNOSIS — Z0000 Encounter for general adult medical examination without abnormal findings: Secondary | ICD-10-CM

## 2010-06-08 LAB — URINALYSIS
Bilirubin Urine: NEGATIVE
Hgb urine dipstick: NEGATIVE
Ketones, ur: NEGATIVE
Leukocytes, UA: NEGATIVE
pH: 7 (ref 5.0–8.0)

## 2010-06-08 LAB — CBC WITH DIFFERENTIAL/PLATELET
Basophils Absolute: 0 10*3/uL (ref 0.0–0.1)
HCT: 42.1 % (ref 39.0–52.0)
Hemoglobin: 14.6 g/dL (ref 13.0–17.0)
Lymphs Abs: 1.8 10*3/uL (ref 0.7–4.0)
MCHC: 34.6 g/dL (ref 30.0–36.0)
MCV: 91.3 fl (ref 78.0–100.0)
Monocytes Absolute: 0.6 10*3/uL (ref 0.1–1.0)
Monocytes Relative: 9.7 % (ref 3.0–12.0)
Neutro Abs: 3.3 10*3/uL (ref 1.4–7.7)
Platelets: 179 10*3/uL (ref 150.0–400.0)
RDW: 14 % (ref 11.5–14.6)

## 2010-06-08 LAB — LIPID PANEL
Cholesterol: 123 mg/dL (ref 0–200)
HDL: 36.9 mg/dL — ABNORMAL LOW (ref >39.00)
Triglycerides: 89 mg/dL (ref 0.0–149.0)
VLDL: 17.8 mg/dL (ref 0.0–40.0)

## 2010-06-08 LAB — HEPATIC FUNCTION PANEL
ALT: 31 U/L (ref 0–53)
Bilirubin, Direct: 0.2 mg/dL (ref 0.0–0.3)
Total Bilirubin: 0.9 mg/dL (ref 0.3–1.2)

## 2010-06-08 LAB — BASIC METABOLIC PANEL
BUN: 13 mg/dL (ref 6–23)
Chloride: 99 mEq/L (ref 96–112)
Creatinine, Ser: 0.9 mg/dL (ref 0.4–1.5)
GFR: 86.66 mL/min (ref >60.00)

## 2010-06-08 LAB — TSH: TSH: 4.28 u[IU]/mL (ref 0.35–5.50)

## 2010-06-17 NOTE — Assessment & Plan Note (Signed)
Summary: YEARLY MEDICARE AETNA/#/CD   Vital Signs:  Patient profile:   70 year old male Height:      73 inches Weight:      219 pounds BMI:     29.00 Temp:     98.4 degrees F oral Pulse rate:   80 / minute Pulse rhythm:   regular Resp:     16 per minute BP sitting:   120 / 60  (left arm) Cuff size:   regular  Vitals Entered By: Lanier Prude, Beverly Gust) (June 05, 2010 2:06 PM) CC: MWV Is Patient Diabetic? No   Primary Care Provider:  Tresa Garter MD  CC:  MWV.  History of Present Illness: The patient presents for a preventive health  Patient past medical history, social history, and family history reviewed in detail no significant changes.  Patient is physically active. Depression is negative and mood is good. Hearing is normal, and able to perform activities of daily living. Risk of falling is negligible and home safety has been reviewed and is appropriate. Patient has normal height, he is a little overweight, and visual acuity is good. Patient has been counseled on age-appropriate routine health concerns for screening and prevention. Education, counseling done. Cognition is nl  Preventive Screening-Counseling & Management  Alcohol-Tobacco     Alcohol drinks/day: 1-2     Alcohol type: beer     Smoking Status: quit     Year Quit: 1980  Caffeine-Diet-Exercise     Caffeine use/day: 5     Does Patient Exercise: yes     Type of exercise: walking, stationary bike     Times/week: 3-4  Hep-HIV-STD-Contraception     Dental Visit-last 6 months yes     TSE monthly: no     Sun Exposure-Excessive: no  Safety-Violence-Falls     Seat Belt Use: yes     Helmet Use: n/a     Firearms in the Home: firearms in the home     Smoke Detectors: yes     Violence in the Home: no risk noted     Sexual Abuse: no     Fall Risk: none  Current Medications (verified): 1)  Synthroid 100 Mcg  Tabs (Levothyroxine Sodium) .... Once Daily 2)  Toprol Xl 25 Mg  Tb24 (Metoprolol  Succinate) .... Once Daily 3)  Centrum Silver   Tabs (Multiple Vitamins-Minerals) .... Once Daily 4)  Simcor 1000-20 Mg  Tb24 (Niacin-Simvastatin) .... 2 By Mouth Qhs 5)  Aspir-Low 81 Mg Tbec (Aspirin) .[name].. 1 Once Daily After Meal 6)  Plavix 75 Mg Tabs (Clopidogrel Bisulfate) .... Take 1 Tab By Mouth Daily 7)  Fish Oil 1200 Mg Caps (Omega-3 Fatty Acids) .[name].. 1 Cap Two Times A Day 8)  Elocon 0.1 %  Crea (Mometasone Furoate) .... Use Two Times A Day As Needed 9)  Vitamin D3 1000 Unit  Tabs (Cholecalciferol) .[name].. 1 By Mouth Daily 10)  Halobetasol Propionate 0.05 % Crea (Halobetasol Propionate) .... As Directed  Allergies (verified): No Known Drug Allergies  Past History:  Past Medical History: Last updated: 05/28/2008 Coronary artery disease - 70% LAD, 100% RCA Dr Juanda Chance Myocardial infarction, hx of Hyperlipidemia Hypothyroidism Allergic rhinitis ? 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, Associate Professor  Married Former Smoker 2 sons Alcohol use-yes  Past Surgical History: s/p PTCA to LAD Tonsillectomy Cath 2011 Colon due 2012 Dr Jarold Motto  Social History: Caffeine use/day:  5 Does Patient Exercise:  yes Dental Care w/in 6 mos.:  yes Sun Exposure-Excessive:  no Seat Belt Use:  yes Fall Risk:  none  Review of Systems  The patient denies anorexia, fever, weight loss, weight gain, vision loss, decreased hearing, hoarseness, chest pain, syncope, dyspnea on exertion, peripheral edema, prolonged cough, headaches, hemoptysis, abdominal pain, melena, hematochezia, severe indigestion/heartburn, hematuria, incontinence, genital sores, muscle weakness, suspicious skin lesions, transient blindness, difficulty  walking, depression, unusual weight change, abnormal bleeding, enlarged lymph nodes, angioedema, and testicular masses.    Physical Exam  General:  alert, well-developed, well-nourished, well-hydrated, cooperative to examination, and good hygiene.   Head:  Normocephalic and atraumatic without obvious abnormalities. No apparent alopecia or balding. Eyes:  No corneal or conjunctival inflammation noted. EOMI. Perrla Ears:  wax L>>R swelling in L ear canal  Nose:  WNL Mouth:  WNL Neck:  No deformities, masses, or tenderness noted. Lungs:  Normal respiratory effort, chest expands symmetrically. Lungs are clear to auscultation, no crackles or wheezes. Heart:  Normal rate and regular rhythm. S1 and S2 normal without gallop, murmur, click, rub or other extra sounds. Abdomen:  Bowel sounds positive,abdomen soft and non-tender without masses, organomegaly or hernias noted. Rectal:  per GI Prostate:  per GI Msk:  No deformity or scoliosis noted of thoracic or lumbar spine.   Pulses:  R and L carotid,radial,femoral,dorsalis pedis and posterior tibial pulses are full and equal bilaterally Neurologic:  No cranial nerve deficits noted. Station and gait are normal. Plantar reflexes are down-going bilaterally. DTRs are symmetrical throughout. Sensory, motor and coordinative functions appear intact. Skin:  R shin Cervical Nodes:  No lymphadenopathy noted Psych:  Cognition and judgment appear intact. Alert and cooperative with normal attention span and concentration. No apparent delusions, illusions, hallucinations   Impression & Recommendations:  Problem # 1:  HEALTH MAINTENANCE EXAM (ICD-V70.0) Overall doing well, age appropriate education and counseling updated and referral for appropriate preventive services done unless declined, immunizations up to date or declined, diet counseling done if overweight, urged to quit smoking if smokes, most recent labs reviewed and current ordered if appropriate, ecg  reviewed or declined (interpretation per ECG scanned in the EMR if done); information regarding Medicare Preventation requirements given if appropriate.  The labs were ordered Colon is pending   Orders: Gastroenterology Referral (GI) Medicare -1st Annual Wellness Visit [phone])  Problem # 2:  NEOPLASM, MALIGNANT, COLON, FAMILY HX, SIBLING (ICD-V16.0) Assessment: Comment Only Colon is pending   Problem # 3:  ABNORMAL GLUCOSE NEC (ICD-790.29) Assessment: Comment Only Watching Labs Reviewed: Creat: 0.9 (03/06/2010)     Problem # 4:  HYPERTENSION, BENIGN (ICD-401.1) Assessment: Improved  His updated medication list for this problem includes:    Toprol Xl 25 Mg Tb24 (Metoprolol succinate) ..... Once daily  BP today: 120/60 Prior BP: 134/66 (03/27/2010)  Prior 10 Yr Risk Heart Disease: N/A (02/27/2010)  Labs Reviewed: K+: 4.1 (03/06/2010) Creat: : 0.9 (03/06/2010)   Chol: 133 (06/04/2009)   HDL: 51.10 (06/04/2009)   LDL: 60 (06/04/2009)   TG: 110.0 (06/04/2009)  Problem # 5:  CORONARY ARTERY DISEASE (ICD-414.00) Assessment: Unchanged S/p recent work up His updated medication list for this problem includes:    Toprol Xl 25 Mg Tb24 (Metoprolol succinate) ..... Once daily    Aspir-low 81 Mg Tbec (Aspirin) .[name]... 1 once  daily after meal    Plavix 75 Mg Tabs (Clopidogrel bisulfate) .[name]... Take 1 tab by mouth daily  Complete Medication List: 1)  Synthroid 100 Mcg Tabs (Levothyroxine sodium) .... Once daily 2)  Toprol Xl 25 Mg Tb24 (Metoprolol succinate) .... Once daily 3)  Centrum Silver Tabs (Multiple vitamins-minerals) .... Once daily 4)  Simcor 1000-20 Mg Tb24 (Niacin-simvastatin) .... 2 by mouth qhs 5)  Aspir-low 81 Mg Tbec (Aspirin) .[name].. 1 once daily after meal 6)  Plavix 75 Mg Tabs (Clopidogrel bisulfate) .... Take 1 tab by mouth daily 7)  Fish Oil 1200 Mg Caps (Omega-3 fatty acids) .[name].. 1 cap two times a day 8)  Elocon 0.1 % Crea (Mometasone furoate) .... Use two times a day  as needed 9)  Vitamin D3 1000 Unit Tabs (Cholecalciferol) .[name].. 1 by mouth daily 10)  Halobetasol Propionate 0.05 % Crea (Halobetasol propionate) .... As directed  Other Orders: Zoster (Shingles) Vaccine Live [phone]) Admin 1st Vaccine (98119) Administration Flu vaccine - MCR (J4782)  Patient Instructions: 1)  Labs next wk 2)  BMP prior to visit, ICD-9:v70.0 3)  Hepatic Panel prior to visit, ICD-9: 4)  Lipid Panel prior to visit, ICD-9: 5)  TSH prior to visit, ICD-9: 6)  CBC w/ Diff prior to visit, ICD-9: 7)  Urine-dip prior to visit, ICD-9: 8)  PSA prior to visit, ICD-9: 9)  HbgA1C prior to visit, ICD-9:790.29 10)  Please schedule a follow-up appointment in 1 year well w/labs.   Orders Added: 1)  Gastroenterology Referral [GI] 2)  Zoster (Shingles) Vaccine Live [90736] 3)  Admin 1st Vaccine [90471] 4)  Administration Flu vaccine - MCR [G0008] 5)  Medicare -1st Annual Wellness Visit [G0438]   Immunizations Administered:  Influenza Vaccine:    Vaccine Type: Fluvax MCR    Site: left deltoid    Mfr: Sanofi Pasteur    Dose: 0.5 ml    Route: IM    Given by: Lanier Prude, CMA(AAMA)    Exp. Date: 10/24/2010    Lot #: [id number]    VIS given: 11/18/09 version given June 05, 2010.  Zostavax # 1:    Vaccine Type: Zostavax    Site: right deltoid    Mfr: Merck    Dose: 0.65    Route: [place]    Given by: Lanier Prude, CMA(AAMA)    Exp. Date: 12/12/2010    Lot #: [id number]    VIS given: 02/05/05 given June 05, 2010.   Immunizations Administered:  Influenza Vaccine:    Vaccine Type: Fluvax MCR    Site: left deltoid    Mfr: Sanofi Pasteur    Dose: 0.5 ml    Route: IM    Given by: Lanier Prude, CMA(AAMA)    Exp. Date: 10/24/2010    Lot #: [id number]    VIS given: 11/18/09 version given June 05, 2010.  Zostavax # 1:    Vaccine Type: Zostavax    Site: right deltoid    Mfr: Merck    Dose: 0.65    Route: [place]    Given by: Lanier Prude, CMA(AAMA)    Exp. Date:  12/12/2010    Lot #: [id number]    VIS given: 02/05/05 given June 05, 2010.

## 2010-06-22 ENCOUNTER — Encounter (INDEPENDENT_AMBULATORY_CARE_PROVIDER_SITE_OTHER): Payer: Self-pay | Admitting: *Deleted

## 2010-07-02 NOTE — Letter (Signed)
Summary: New Patient letter  [facility]  [address] [place], [place] [address]   Phone: [phone]  Fax: [phone]       06/22/2010 MRN: [id number]  [name] [name] [address] [place], [place]  [address]  [place]  Dear Mr. HJORT,  Welcome to the Gastroenterology Division at [facility].    You are scheduled to see Dr.  Jarold Motto on 07-28-10 at 2:45P.M. on the 3rd floor at [facility], [address]  We ask that you try to arrive at our office 15 minutes prior to your appointment time to allow for check-in.  We would like you to complete the enclosed self-administered evaluation form prior to your visit and bring it with you on the day of your appointment.  We will review it with you.  Also, please bring a complete list of all your medications or, if you prefer, bring the medication bottles and we will list them.  Please bring your insurance card so that we may make a copy of it.  If your insurance requires a referral to see a specialist, please bring your referral form from your primary care physician.  Co-payments are due at the time of your visit and may be paid by cash, check or credit card.     Your office visit will consist of a consult with your physician (includes a physical exam), any laboratory testing he/she may order, scheduling of any necessary diagnostic testing (e.g. x-ray, ultrasound, CT-scan), and scheduling of a procedure (e.g. Endoscopy, Colonoscopy) if required.  Please allow enough time on your schedule to allow for any/all of these possibilities.    If you cannot keep your appointment, please call [phone] to cancel or reschedule prior to your appointment date.  This allows [place] the opportunity to schedule an appointment for another patient in need of care.  If you do not cancel or reschedule by 5 p.m. the business day prior to your appointment date, you will be charged a $50.00 late cancellation/no-show fee.    Thank you for  choosing [facility] for your medical needs.  We appreciate the opportunity to care for you.  Please visit [place] at our website  to learn more about our practice.                     Sincerely,                                                             The [facility] Division

## 2010-07-28 ENCOUNTER — Encounter: Payer: Self-pay | Admitting: Gastroenterology

## 2010-07-28 ENCOUNTER — Ambulatory Visit (INDEPENDENT_AMBULATORY_CARE_PROVIDER_SITE_OTHER): Payer: Medicare Other | Admitting: Gastroenterology

## 2010-07-28 VITALS — BP 148/66 | HR 72 | Ht 73.0 in | Wt 222.4 lb

## 2010-07-28 DIAGNOSIS — I2510 Atherosclerotic heart disease of native coronary artery without angina pectoris: Secondary | ICD-10-CM

## 2010-07-28 DIAGNOSIS — Z800 Family history of malignant neoplasm of digestive organs: Secondary | ICD-10-CM

## 2010-07-28 MED ORDER — PEG-KCL-NACL-NASULF-NA ASC-C 100 G PO SOLR: 1.0000 | Freq: Once | ORAL | Status: AC

## 2010-07-28 NOTE — Patient Instructions (Signed)
Your procedure has been scheduled for 08/26/2010, please follow the seperate instructions.  Please hold Plavix 5 days prior to your colonoscopy per Dr Jarold Motto. Your prescription(s) have been sent to you pharmacy. ,

## 2010-07-28 NOTE — Assessment & Plan Note (Signed)
He currently is asymptomatic from a cardiovascular standpoint, and I advised him to continue all of his cardiac medications. We'll hold his Plavix 5 days before his colonoscopy was otherwise advised.

## 2010-07-28 NOTE — Progress Notes (Signed)
History of Present Illness:  This is a 70 year old Caucasian male with a family history of colon cancer in his brother at age 89, and two out of three sisters with colon polyps. He had a colonoscopy performed 5 years ago that was unremarkable. He denies any current gastrointestinal symptoms, specifically rectal bleeding, melena, abdominal pain, anorexia or weight loss. Past medical system review complicated by coronary artery disease with a myocardial infarction some 10 years ago with balloon angioplasty. He is followed currently by Dr. Charlton Haws and is on aspirin and Plavix. There is no history of upper gastrointestinal or hepatobiliary complaints. He denies any specific food intolerances.  I have reviewed this patient's present history, medical and surgical past history, allergies and medications.     ROS: The remainder of the 10 point ROS is negative. The patient jogs regularly and denies angina or any other cardiovascular or pulmonary symptomatology. He does have hyperlipidemia, chronic thyroid dysfunction, and is a former smoker.  Past Medical History  Diagnosis Date  . CAD (coronary artery disease)   . MI (myocardial infarction)   . Hyperlipemia   . Hypothyroidism   . Allergic rhinitis   . OSA (obstructive sleep apnea)   . Psoriasis   . Family hx of colon cancer     brother   Past Surgical History  Procedure Date  . Ptca   . Tonsillectomy   . Cardiac catheterization     reports that he has quit smoking. He has never used smokeless tobacco. He reports that he drinks alcohol. He reports that he does not use illicit drugs. family history includes COPD in his father; Colon cancer in his brother, maternal aunts, maternal uncle, paternal aunt, paternal grandfather, and unspecified family member; Colon polyps in his sister; Coronary artery disease in his mother; and Hypertension in an unspecified family member. No Known Allergies      Physical Exam: General well developed well  nourished patient in no acute distress, appearing their stated age Eyes PERRLA, no icterus, fundoscopic exam per opthamologist Skin no lesions noted Neck supple, no adenopathy, no thyroid enlargement, no tenderness Chest clear to percussion and auscultation Heart no significant murmurs, gallops or rubs noted Abdomen no hepatosplenomegaly masses or tenderness, BS normal. . Extremities no acute joint lesions, edema, phlebitis or evidence of cellulitis. Neurologic patient oriented x 3, cranial nerves intact, no focal neurologic deficits noted. Psychological mental status normal and normal affect.  Assessment and plan: 70 year old patient of Plavix and aspirin with a strong family history of colon carcinoma polyposis. Have scheduled him for followup colonoscopy with a balanced electrolyte preparation at all prep. We will hold his Plavix 5 days before this procedure unless otherwise advised by cardiology. Review of his previous exam shows a difficult exam with sedation problems, have scheduled his procedure with propofol sedation. Please copy her primary care physician, referring physician, and pertinent subspecialists.  No diagnosis found.

## 2010-08-04 ENCOUNTER — Other Ambulatory Visit: Payer: Self-pay | Admitting: Internal Medicine

## 2010-08-21 ENCOUNTER — Other Ambulatory Visit: Payer: Self-pay | Admitting: Internal Medicine

## 2010-08-25 ENCOUNTER — Encounter: Payer: Self-pay | Admitting: Gastroenterology

## 2010-08-26 ENCOUNTER — Ambulatory Visit (AMBULATORY_SURGERY_CENTER): Payer: Medicare Other | Admitting: Gastroenterology

## 2010-08-26 ENCOUNTER — Encounter: Payer: Self-pay | Admitting: Gastroenterology

## 2010-08-26 VITALS — BP 153/71 | HR 57 | Temp 97.8°F | Resp 18 | Ht 73.0 in | Wt 215.0 lb

## 2010-08-26 DIAGNOSIS — D126 Benign neoplasm of colon, unspecified: Secondary | ICD-10-CM | POA: Insufficient documentation

## 2010-08-26 DIAGNOSIS — K5730 Diverticulosis of large intestine without perforation or abscess without bleeding: Secondary | ICD-10-CM | POA: Insufficient documentation

## 2010-08-26 DIAGNOSIS — Z1211 Encounter for screening for malignant neoplasm of colon: Secondary | ICD-10-CM | POA: Insufficient documentation

## 2010-08-26 DIAGNOSIS — K921 Melena: Secondary | ICD-10-CM

## 2010-08-26 MED ORDER — SODIUM CHLORIDE 0.9 % IV SOLN: 500.0000 mL | INTRAVENOUS | Status: DC

## 2010-08-26 NOTE — Patient Instructions (Signed)
Findings:  Diverticulosis, Small Polyp  Recommendations:  High Fiber Diet                                   Repeat colonoscopy in five years.

## 2010-08-27 ENCOUNTER — Telehealth: Payer: Self-pay | Admitting: *Deleted

## 2010-08-27 NOTE — Telephone Encounter (Signed)

## 2010-09-08 NOTE — Assessment & Plan Note (Signed)
[facility]                               LIPID CLINIC NOTE   NAME:JOHNSONHassaan, Crite                     MRN:          [id number]  DATE:03/28/2008                            DOB:          1940-06-28    Mr. Mccollum comes in today for followup of his hyperlipidemia therapy,  which includes Simcor 20/1000.  He takes two of those nightly.  He also  takes fish oil 1000 mg twice daily.  He has been compliant with both of  these therapies.  He has been tolerating them just fine except for  slight flushing reaction occasionally.   OTHER MEDICATIONS:  1. Metoprolol.  2. Levothyroxine.  3. Multivitamin.  4. Plavix.  5. Aspirin 81 mg.   PHYSICAL EXAMINATION:  His weight is 216 pounds, blood pressure is  120/65, heart rate 64.   LABORATORY DATA:  Total cholesterol 124, triglycerides 58, HDL 43.9, LDL  69.  Liver function tests are within normal limits.   ASSESSMENT:  Mr. Affeldt numbers look great.  Triglycerides and LDL  are at goal.  HDL is not quite at the goal greater than 45.  He admits  to not exercising much, but he does try to follow heart-healthy diet.  He is pleased to hear about his numbers.   PLAN:  We are going to continue with the same medications for now.  I  encouraged him to increase exercise and get that HDL to goal, but  otherwise do not change what he has been doing.  Followup with him will  be in 6 months with repeat lipid and liver panel.  He was instructed to  call us with any questions or concerns in the meantime.      Charolotte Eke, PharmD  Electronically Signed      Rollene Rotunda, MD, [facility]  Electronically Signed   TP/MedQ  DD: 03/28/2008  DT: 03/29/2008  Job #: [id number]

## 2010-09-08 NOTE — Assessment & Plan Note (Signed)
[facility]                               LIPID CLINIC NOTE   NAME:[name], [name]                     MRN:          [id number]  DATE:10/05/2007                            DOB:          [birth year]    PAST MEDICAL HISTORY:  1. Hyperlipidemia.  2. Coronary artery disease with angioplasty to his LAD.  3. Hypothyroidism.   MEDICATIONS:  1. Metoprolol 25 mg daily.  2. Synthroid 100 mcg daily.  3. Beta carotene daily.  4. Multivitamin daily.  5. Plavix 75 mg daily.  6. Fish oil 1 gram 3x daily.  7. Aspirin 81 mg daily.  8. Symbicort 20 mg/1000 mg 2 at bedtime.   VITAL SIGNS:  Weight 209 pounds.  Blood pressure 122/74.  Heart rate 68.   LABORATORY DATA:  Total cholesterol 128, triglycerides 91, HDL 41, LDL  69.  LFTs within normal limits.   ASSESSMENT:  [name] [name] is a very pleasant, 70 year old gentleman who  is very conscientious of his diet and exercise.  He follows a very low  fat, low carbohydrate diet.  He normally exercises 3 to 4 times a week  for approximately 30 to 45 minutes  However, he is an Airline pilot, and so  during tax season, he does very little exercise and a lot of work.  Then  he takes about a month off to recuperate from his busy time and restarts  his exercise regimen in June, which he has restarted several weeks ago.  He is feeling much better about himself and his health.  His total  cholesterol is at goal of less than 200.  Triglycerides at goal of less  than 150, HDL is slightly still above goal of greater than 40, however,  decreased since last visit, but not surprising given his decrease in  exercise over recent months.  His LDL is at goal of less than 70.   PLAN:  1. Continue current medication regimen.  2. Resume exercise regimen.  3. Continue low fat diet.  4. Followup visit in six months for lipid panel and LFTs.  We will      follow up and make adjustments at that time.      Leota Sauers, PharmD  Electronically Signed      Jesse Sans. Daleen Squibb, MD, [facility]  Electronically Signed   LC/MedQ  DD: 10/05/2007  DT: 10/05/2007  Job #: [id number]

## 2010-09-08 NOTE — Assessment & Plan Note (Signed)
[facility]                            CARDIOLOGY OFFICE NOTE   NAME:[name], [name] MERLOS                     MRN:          [id number]  DATE:04/11/2008                            DOB:          May 22, 1940    PRIMARY CARE PHYSICIAN:  Georgina Quint. Plotnikov, MD   CLINICAL HISTORY:  [name] is 70 years old and returned for a  followup management with his coronary heart disease.  He has had a  chronic total occlusion of the right coronary artery and had previous  cutting balloon angioplasty of the diagonal branch of the LAD, which  subsequently occluded.  He has residual 70-80% narrowing in the mid LAD.  We considered surgery, but finally decided on continued medical therapy.   He has done well since that time.  He has had no angina, shortness of  breath, or chest pain.  He has gained about 20 pounds.  He has been  followed in the Lipid Clinic and his liver profile has had been good on  Symbicort.   PAST MEDICAL HISTORY:  Significant for hypertension and hyperlipidemia.   CURRENT MEDICATIONS:  1. Metoprolol 25 mg daily.  2. Levothyroxine 100 mcg daily.  3. Plavix 75 mg daily.  4. Fish oil.  5. Aspirin 81 mg daily.  6. Symbicort 20/1000 two at bedtime.   SOCIAL HISTORY:  [name] is married and is retired from Audiological [employer].  He still does some part-time accounting work.  He has a son who had  served in the [employer] and has a brain tumor.  He does not smoke.   PHYSICAL EXAMINATION:  VITAL SIGNS:  Blood pressure is 146/78 and the  pulse is 62 and regular.  NECK:  There was no venous distension.  The carotid pulses were full  without bruits.  CHEST:  Clear.  HEART:  Rhythm was regular.  I can hear no murmurs or gallops.  ABDOMEN:  Soft with normal bowel sounds.  There is no  hepatosplenomegaly.  EXTREMITIES:  Peripheral pulses are full and no peripheral edema.   His stress test showed good exercise tolerance.  He went to stage IV  with the  [name] protocol without chest pain.  He had had some inferior  scar with mild periinfarct ischemia and he also had mild ischemia in the  basal anterior wall and disposition of the diagonal branch.   IMPRESSION:  1. Coronary artery disease status post prior percutaneous coronary      intervention with coronary anatomy as described above.  2. Good left ventricular function.  3. Hyperlipidemia.  4. Hypertension   RECOMMENDATIONS:  I think [name] [name] is doing well from a standpoint of his  heart and his liver profile.  His blood pressure is borderline and his  weight is up.  We emphasized the importance of this and see he has  anatomy that is close to requiring bypass surgery.  He plans to join the  gym and will work to get his weight down initially to 200 and then to  185 pounds.  We will plan to see him back in a year.  We will do a  exercise Myoview scan prior to that visit.     [name] Elvera Lennox Juanda Chance, MD, [facility]  Electronically Signed    BRB/MedQ  DD: 04/11/2008  DT: 04/11/2008  Job #: [id number]

## 2010-09-08 NOTE — Assessment & Plan Note (Signed)
[facility]                               LIPID CLINIC NOTE   NAME:[name] [name], [name] [name]                     MRN:          [id number]  DATE:12/01/2006                            DOB:          03-Feb-1941    [name] [name] is seen back in the lipid clinic for further evaluation and  medication titration associated with his hyperlipidemia.  He has been  feeling and doing well since his last visit.  He continues to exercise  and has worked to increase the duration of his exercise activity 5 to 6  days each week.  He has been following a low-fat, low-cholesterol diet  and has been paying attention particularly to snacks throughout the day.   PAST MEDICAL HISTORY:  Pertinent for documented coronary artery disease.  He experienced total occlusion of his RCA in June 2007 and underwent  cutting balloon angioplasty of an osteal lesion to the diagonal branch  of the LAD with residual 7 to 8 cm in the mid LAD.  A follow up scan in  December revealed lateral ischemia, at which time he was found to have a  totally occluded diagonal branch of the LAD at the PTCA site.  Medical  therapy was selected.  The patient has hyperlipidemia, documented  coronary artery disease.   CURRENT MEDICATIONS:  1. Metoprolol 25 mg daily.  2. Vytorin 10/20 one tablet daily.  3. Levothyroxine 100 mcg daily.  4. Betacarotene daily.  5. Multivitamin daily.  6. Niaspan 1500 mg daily at bedtime.  7. Fish oil 1 tablet twice daily.  8. Baby aspirin daily.   REVIEW OF SYSTEMS:  As stated in the HPI and otherwise negative.   PHYSICAL EXAM:  Blood pressure today is 126/68, heart rate is 60,  respirations 16.  Labs reveal LDL of 59.  Other labs have been appended  to the chart.   IMPRESSION:  The patient has documented coronary disease status post  percutaneous coronary intervention of the diagonal branch of the left  anterior descending with subsequent reocclusion and hyperlipidemia.  He  has  been working to increase his exercise activity, which I  congratulated him on.  As we discussed in the past, we would like to  undertake modification of his lipid-lowering therapy regimen and  discontinue Vytorin, and change him to Simcor, which is a combination of  simvastatin and Niaspan at a dose of 20/1000 one tablet daily at  bedtime.  This will improve compliance, as well as reduce tablet burden.  The patient agrees to give this a try.  He will call with muscle aches,  pains, weakness, fatigue, or other problems.  We have discussed that we  may lose some control of LDL in the next several weeks as we discontinue  the Cetamide portion of his combination drug therapy, but I am hopeful  that he will be able to tolerate this with an eventual up-titration to 2  of the 20/1000 tablets, providing a total of 40 mg of Zocor and 2 g  Niaspan therapy.  It is of note that the patient has tolerated his  up-  titration to  1500 mg of niacin without problems.  I have given the patient a  prescription to facilitate compliance.  He will follow up in 6 to 12  weeks.      Shelby Dubin, PharmD, BCPS, CPP  Electronically Signed      Veverly Fells. Excell Seltzer, MD  Electronically Signed   MP/MedQ  DD: 12/01/2006  DT: 12/02/2006  Job #: [id number]   cc:   Everardo Beals. Juanda Chance, MD, [facility]

## 2010-09-08 NOTE — Assessment & Plan Note (Signed)
[facility]                            CARDIOLOGY OFFICE NOTE   NAME:[name] [name], [name] [name]                     MRN:          [id number]  DATE:03/28/2007                            DOB:          [birth year]    PRIMARY CARE PHYSICIAN:  Sonda Primes, MD.   CLINICAL HISTORY:  [name] [name] is a 70 years old and returned for  management of his coronary heart disease.  He has chronic total  occlusion of the right coronary artery and in June of 2007, had a  cutting balloon angioplasty of the diagonal branch in the LAD after his  scan suggested anterolateral ischemia.  He was restudied in January of  2008 and the diagonal branch had occluded and there was 70-80% narrowing  in the mid LAD.  We debated regarding surgery versus medical therapy and  decided on medical therapy.   He has done quite well since then.  He has had no chest pain, shortness  of breath or palpitations.  He is exercising regularly on a treadmill  test.  We did a Myoview scan prior to this visit and he had inferior  ischemia.  It was not read on the scan, but I thought there was some  mild ischemia in the anterolateral area which was what he had on his  previous scans from the diagonal occlusion.  This scan was not felt to  have changed.   PAST MEDICAL HISTORY:  Significant for hyperlipidemia.   CURRENT MEDICATIONS:  1. Metoprolol.  2. Levothyroxine.  3. Plavix.  4. Aspirin.  5. Fish Oil.  6. Simcor 20/1000 two daily.   EXAMINATION:  The blood pressure is 131/76 and pulse 62 and regular.  There was no venous distention.  Carotid pulses were full without  bruits.  CHEST:  Clear.  CARDIAC RHYTHM:  Regular.  I could hear no murmurs or gallops.  ABDOMEN:  Soft, normal bowel sounds.  There is no hepatosplenomegaly.  Peripheral pulses are full, there is no peripheral edema.   IMPRESSION:  1. Coronary artery disease status post prior percutaneous coronary      intervention with coronary  anatomy as described above, now stable.  2. Normal left ventricular function.  3. Hyperlipidemia.   RECOMMENDATIONS:  I think [name] [name] is doing well at present.  He has a lot  of plaque in his arteries and I think aggressive secondary prevention is  very important.  I agree with the simvastatin with the Simcor.  He is  scheduled for followup lipid studies in February.  If his LDL is  not at target, I would be in favor of adding Zetia.  I will plan to see  him back in followup in a year and will plan an exercise rest stress  Myoview scan prior to that visit.     [name] Elvera Lennox Juanda Chance, MD, [facility]  Electronically Signed    BRB/MedQ  DD: 03/28/2007  DT: 03/28/2007  Job #: [id number]

## 2010-09-08 NOTE — Assessment & Plan Note (Signed)
[facility]                               LIPID CLINIC NOTE   NAME:JOHNSONAndrell, Bergeson                     MRN:          [id number]  DATE:03/02/2007                            DOB:          December 09, 1940    Mr. Traweek comes in today for followup of his hyperlipidemia therapy.  He was recently switched from Vytorin to [employer] 500/20 one daily.  He  has been tolerating this just fine with no muscle aches, pains, or  flushing.  He has been compliant, and has not missed any doses.   Other medications have not changed.  They include Synthroid, Toprol XL,  enteric coated aspirin, multivitamin, and beta carotene supplement.   PHYSICAL EXAMINATION:  His weight is 193 pounds.  Blood pressure is  120/70.  Heart rate is 56.   LABORATORY DATA:  Includes total cholesterol 143, triglycerides 83, HDL  45.8, LDL 81.  Liver function tests are within normal limits.   ASSESSMENT:  Mr. Dacy is tolerating Symcor at a low dose.  His LDL,  however, has risen from 65 to 81, and is now not at the goal of less  than 70.  Increase in LDL is not to be unexpected, as the Zetia portion  of Vytorin has been discontinued.  His HDL is much improved from 33 to  45.8, and is not at goal of greater than 40.  He continues with his  heart-healthy diet, and exercises as tolerated.   PLAN:  As discussed with him at the previous visit, we will now increase  his Symcor to 2 tablets at bedtime.  This will be a total of 1 mg of  Niacin and 40 mg of Simvastatin daily.  I encouraged him to continue  with his diet and exercise plans.  We will follow up in 3 months with a  lipid and liver panel.  This patient was seen along with Fredonia Highland, PharmD resident.      Charolotte Eke, PharmD  Electronically Signed      Rollene Rotunda, MD, [facility]  Electronically Signed   TP/MedQ  DD: 03/02/2007  DT: 03/02/2007  Job #: [id number]

## 2010-09-08 NOTE — Assessment & Plan Note (Signed)
[facility]                            CARDIOLOGY OFFICE NOTE   NAME:[name] [name], [name] [name]                     MRN:          [id number]  DATE:09/22/2006                            DOB:          1940/09/13    PRIMARY CARE PHYSICIAN:  Dr. Trinna Post Plotnikov.   CLINICAL HISTORY:  Mr. Fralix is 70 years old and has had a known  chronic total occlusion of the right coronary artery and had a cutting  balloon angioplasty to the diagonal branch of the LAD in 2007.  A  followup scan suggested new ischemia in that region.  He underwent  catheterization and was found to have total occlusion of the diagonal  branch.  We debated on surgery versus PTCA of a borderline lesion in the  LAD versus medical therapy and decided on medical therapy.  He has done  quite well since that time.  He has had no symptoms.  He has been very  faithful in his exercise programs and exercising regularly.  He has also  been on Niaspan, saw Shelby Dubin and increased the dosage.  His lipid  panel improved and he met target on his HDL for the first time.   PAST MEDICAL HISTORY:  Hyperlipidemia.   CURRENT MEDICATIONS:  Metoprolol, Vytorin, levothyroxine, multivitamins,  Plavix, Niaspan, fish oil, aspirin.   EXAMINATION:  Blood pressure is 138/62 and the pulse 53 and regular.  There was no venous distention.  The carotid pulses were full without  bruits.  CHEST:  Clear.  CARDIAC:  Rhythm was regular.  I heard no murmurs or gallops.  ABDOMEN:  Soft, no organomegaly.  Peripheral pulses are full and there is no peripheral edema.   IMPRESSION:  1. Coronary artery disease, status post prior percutaneous coronary      interventions as described above, now stable.  2. Hyperlipidemia with low HDL, now improved.  3. Normal left ventricular function.   RECOMMENDATIONS:  I think Mr. Sanzo is doing well.  We will plan to  continue his medical therapy.  We will plan to see him back in 6 months  and  we will do a rest stress Myoview scan prior to that visit.     [name] Elvera Lennox Juanda Chance, MD, [facility]  Electronically Signed   BRB/MedQ  DD: 09/22/2006  DT: 09/22/2006  Job #: [id number]   cc:   Georgina Quint. Plotnikov, MD

## 2010-09-11 NOTE — H&P (Signed)
NAMEMarland [name]  CADEL, STAIRS NO.:  [id number]   MEDICAL RECORD NO.:  [id number]          PATIENT TYPE:  INP   LOCATION:  1846                         FACILITY:  [facility]   PHYSICIAN:  Jonelle Sidle, M.D. LHCDATE OF BIRTH:  Jan 20, 1941   DATE OF ADMISSION:  09/28/2005  DATE OF DISCHARGE:                                HISTORY & PHYSICAL   PRIMARY CARDIOLOGIST:  Charlies Constable, M.D.   PRIMARY CARE PHYSICIAN:  Georgina Quint. Plotnikov, M.D.   REASON FOR ADMISSION:  Recent onset, chest discomfort, and known history of  coronary artery disease.   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  His most recent ischemic evaluation was  via an exercise Myoview done in October, 2006, which showed no diagnostic  electrocardiographic changes and evidence of a small prior inferior wall  infarct with mild peri-infarct ischemia with an ejection fraction of 72% and  inferior wall hypokinesis.  He presented to the emergency department today,  describing two separate episodes of intense indigestion.  He states that  yesterday, he was involved in a stressful situation, having to let go a  client.  Following this, experienced intense indigestion that lasted for  approximately 45 minutes.  He had no recurrence until today.  Again,  following an episode of emotional upset but also a bowl of tomato soup where  he felt similar symptoms that lasted for approximately two hours and  resolved without any other specific intervention.  An electrocardiogram at  this point in the emergency department shows sinus rhythm with right bundle  branch block and left anterior fascicular block.  This particular time,  there  is no old tracing for comparison.  His point-of-care cardiac markers  are normal, and he is not having any active symptoms.  He states that these  symptoms are reminiscent of prior pain that he had back in the 1990s with  his myocardial infarction.  Otherwise, he is typically active and exercises  by walking on a regular basis.  He has no typical exertional symptoms.   ALLERGIES:  No known drug allergies.   MEDICATIONS:  1.  Aspirin 325 mg p.o. daily.  2.  Synthroid 100 mcg p.o. daily.  3.  Vytorin 10/20 mg p.o. daily.  4.  Toprol XL 12.5 mg p.o. daily.   PAST MEDICAL HISTORY:  1.  Coronary artery disease, as outlined above.  2.  Hyperlipidemia.  3.  Hypothyroidism.   SOCIAL HISTORY:  The patient is married.  Lives in [place].  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  He drinks 1-2  alcoholic beverages a day.  He quit tobacco use back in the 1980s.  He  exercises 2-3 times a week by walking/jogging on a treadmill, up to an hour  at a time.   FAMILY HISTORY:  Significant for myocardial infarction in the patient's  mother, who died at age 16.   REVIEW OF SYSTEMS:  As described in the history of present illness.  He does  not report any typical indigestion symptoms.  He has had no bleeding  problems.  Systems are otherwise negative.   PHYSICAL EXAMINATION:  VITAL SIGNS:  Blood pressure 140/75, heart rate 63,  respirations 20, oxygen saturation 99% on 2 liters nasal cannula.  GENERAL:  This is a well-developed male in no acute distress.  Not in any  active chest pain.  NECK:  No elevated jugular venous pressure or loud carotid bruits.  No  thyromegaly.  LUNGS:  Clear to auscultation bilaterally.  CARDIAC:  Regular rate and rhythm without rub, murmur, or gallops.  No  pericardial rub.  ABDOMEN:  Soft without hepatomegaly or bruits.  EXTREMITIES:  No significant clubbing, cyanosis or edema.   Chest x-ray is reported as showing no active disease  with borderline  cardiomegaly.   INITIAL LABORATORY DATA:  WBC 6.1, hemoglobin 14.4, platelets 235.  Sodium  139, potassium 3.7, BUN 9, creatinine 1, glucose 134.  Initial point-of-care  troponin I level is less than 0.05.  INR is 1.   IMPRESSION:  1.  Recent onset, episodes of intense indigestion following episodes of      emotional upset.  Symptoms are reminiscent of prior presentations with      coronary disease.  He denies any typical indigestion symptoms on any      regular basis.  His electrocardiogram at this point is abnormal, as      outlined.  We await an old tracing for comparison.  His point-of-care      cardiac markers at this point are normal.  He is not having any active      symptoms.  His last noninvasive stress test was in October, as outlined      above.  He has been followed closely by Dr. Juanda Chance.  2.  Hyperlipidemia:  On Vytorin.   PLAN:  1.  I discussed the situation with the patient, his wife, and son.  He will      be admitted to telemetry for observation.  We will plan to cycle cardiac      markers and will keep him n.p.o. after midnight.  His medications will      be continued with the addition of Lovenox.  Dr. Juanda Chance plans to evaluate      him in the morning and can make      further assessment regarding diagnostic cardiac testing.  2.  In the meanwhile, we will obtain another electrocardiogram for      comparison.  3.  Further plans to follow.      Jonelle Sidle, M.D. [facility]  Electronically Signed     SGM/MEDQ  D:  09/28/2005  T:  09/28/2005  Job:  [id number]   cc:   Charlies Constable, M.D. [facility]  [address]  [address]  [place]  [place] [address]   Georgina Quint. Plotnikov, M.D. [facility]  [address]  [place]  [place] [address]

## 2010-09-11 NOTE — Assessment & Plan Note (Signed)
[facility]                              CARDIOLOGY OFFICE NOTE   NAME:[name] [name], [name] [name]                     MRN:          [id number]  DATE:12/15/2005                            DOB:          [birth year]   CLINICAL HISTORY:  [name] is 70 years old and has known coronary  disease with a chronic total occluded right coronary artery.  He was  recently admitted with a non-ST myocardial infarction and underwent cutting  balloon angioplasty of an ostial lesion of a moderately large diagonal  branch with a good result.  He had residual 70-80% narrowing in the LAD.   He has done quite well since that time.  He has been involved in the rehab  program and not had any recurrent chest pain, shortness of breath or  palpitations.   PAST MEDICAL HISTORY:  Significant for hyperlipidemia __________ .   CURRENT MEDICATIONS:  Include metoprolol, Vytorin, levothyroxine, aspirin,  beta carotene, vitamins and Plavix.   PHYSICAL EXAMINATION:  VITAL SIGNS:  The blood pressure is 120/64 and the  pulse 68 and regular.  NECK:  There is no vein distention.  The carotid pulses were full without  bruits.  CHEST:  Clear.  CARDIAC:  Rhythm is regular.  No murmurs or gallops.  ABDOMEN:  Soft without organomegaly.  EXTREMITIES:  Peripheral pulses were full and there was no peripheral edema.   IMPRESSION:  1. Coronary artery disease, status post recent PCI of a diagonal branch at      LAD with residual chronic total occlusion of the right coronary artery      and 70% stenosis of the LAD, now stable.  2. Good left ventricular function.  3. Hyperlipidemia with low HDL.   RECOMMENDATIONS:  I think [name] [name] is doing very well.  His HDL is quite low and  will plan to start him on niacin 500 mg a day and get a lipid profile in one  month and have him seen by [name] [name] in the Lipid Clinic.  Will go ahead  and approve his increased target heart rate at the rehab program.  I will  plan to see him back in December and we will do an exercise stress test  and Myoview scan prior to that visit to assess him for restenosis and also  to assess the severity of the LAD lesions.                               [name] Elvera Lennox Juanda Chance, MD, [facility]   BRB/MedQ  DD:  12/15/2005 DT:  12/16/2005 Job #:  [id number]   cc:   Sonda Primes, MD

## 2010-09-11 NOTE — Discharge Summary (Signed)
[name] [name], [name] [name]              ACCOUNT NO.:  [id number]   MEDICAL RECORD NO.:  [id number]          PATIENT TYPE:  INP   LOCATION:  6525                         FACILITY:  [facility]   PHYSICIAN:  Jonelle Sidle, M.D. LHCDATE OF BIRTH:  1941/03/20   DATE OF ADMISSION:  09/28/2005  DATE OF DISCHARGE:  09/30/2005                           DISCHARGE SUMMARY - REFERRING   DISCHARGE DIAGNOSIS:  1.  Non-ST segment elevated myocardial infarction.  2.  Coronary artery disease.  3.  Status post stenting to the left anterior descending.  4.  Hyperlipidemia.  5.  History as previously.   Prior to discharge cardiac rehabilitation assisted with education and  ambulation.   PROCEDURES PERFORMED:  Cardiac catheterization on September 29, 2005 with PCI to  the left anterior descending.   ADMITTING PHYSICIAN:  Dr. Diona Browner.   DISCHARGING PHYSICIAN:  Dr. Juanda Chance.   SUMMARY OF HISTORY:  [name] [name] is a 70 year old male with known coronary  artery disease.  He presented to the emergency room on the day of admission  describing two episodes of intense indigestion.  This was preceded by a  stressful situation and lasted approximately 45 minutes.  He denies any  further recurrences until the day of admission.  Symptoms lasted  approximately two hours and were also preceded by a stressful situation.  An  EKG did not show any acute changes in the emergency room and he was admitted  for further observation.   His medical history is notable for hyperlipidemia, hypothyroidism, known  coronary artery disease with a total RCA and moderate disease of the LAD in  the early 1990s.  Exercise Myoview in October2006 showed an EF of 72%, small  inferior scar with mild peri-infarct ischemia, remote tobacco use, alcohol  use.   LABORATORY DATA:  Chest x-ray on the 5th showed borderline cardiomegaly and  without acute pulmonary disease.  Admission H&H 14.4 and 42.7, normal  indices, platelets 235, WBCs 6.1.   Subsequent hematology prior to discharge  showed an H&H of 13.3 and 38.2, normal indices, platelets 213, WBCs 7.4.  PT  13.3, INR 1 on admission.  It is noted on review of the chart PT/INR was  drawn on the 6th for uncertain reasons as it was not ordered and the patient  is not on Coumadin and a PT was 43.17 and an INR 4.7 (we will look into this  prior to his discharge).  Admission sodium was 139, potassium 3.7, BUN 9,  creatinine 1, glucose 134.  Normal LFTs.  Prior to discharge sodium was 139,  potassium 4.1, BUN 11, creatinine 1.1, total bilirubin was 1.7 and AST was  38.  CK-MBs x5 were negative for myocardial infarction.  Troponins were  slightly elevated at 0.14, 0.42, 0.32, 0.19, and 0.3.  Fasting lipids showed  a total cholesterol 106, triglycerides 102, HDL 25, LDL of 57.  TSH was  2.178.  EKGs on admission showed normal sinus rhythm, right bundle branch  block, left axis deviation, left anterior fascicular block.  Subsequent EKGs  were essentially the same.   HOSPITAL COURSE:  [name] [name] was admitted to  [facility] and placed  on Lovenox as well as his home medications.  Overnight he did not have any  further chest discomfort but with positive troponins Dr. Juanda Chance felt he had  a non-ST elevated myocardial infarction and it was felt that he should  undergo cardiac catheterization.  He was screened for the cangrelor study  for the research team.  On September 29, 2005 Dr. Juanda Chance performed cardiac  catheterization.  This revealed an ejection fraction of 60%, 70-80% mid LAD,  95% diagonal 1, 50% ramus, 30% proximal circumflex, total RCA.  Dr. Juanda Chance  performed stenting to the diagonal reducing this from 95% to less than 10%  without difficulty.  By June 7 it was felt that he was doing well and Dr.  Juanda Chance felt that he could be discharged home.   DISPOSITION:  Patient is discharged home.  Asked to maintain low salt, fat,  cholesterol diet.  His activity and wound care are per  supplemental  discharge sheet; however, activities are restricted for approximately one  week.  He was asked to bring all medications to all follow-up appointments.  His new medications including Plavix 75 mg daily, nitroglycerin 0.4 as  needed.  He was asked to continue aspirin 325 mg daily, Synthroid 100 mcg  daily, Toprol XL 12.5 mg daily, and Vytorin 10/20 q.h.s.  He will follow up  with Dr. Charlton Haws. on October 20, 2005 at 9:45 as Dr. Juanda Chance does not have  any appointments available.  Just prior to this appointment blood work will  be obtained for fasting lipids and LFTs on October 15, 2005 at 8:30 and will be  reviewed at the time of follow-up.  Discharge time less than 30 minutes.      Joellyn Rued, P.A. [facility]      Jonelle Sidle, M.D. [facility]  Electronically Signed    EW/MEDQ  D:  09/30/2005  T:  09/30/2005  Job:  [id number]   cc:   Georgina Quint. Plotnikov, M.D. [facility]  [address]  [place]  [place] [address]   Charlies Constable, M.D. [facility]  [address]  [address]  [place]  [place] [address]

## 2010-09-11 NOTE — Assessment & Plan Note (Signed)
 [facility]                            CARDIOLOGY OFFICE NOTE   NAME:Wannamaker, JAYLENE SCHROM                     MRN:          [id number]  DATE:04/14/2006                            DOB:          1940-06-07    PRIMARY CARE PHYSICIAN:  Georgina Quint. Plotnikov, MD   CLINICAL HISTORY:  Mr. Pasch is 70 years old and has known coronary  artery disease, and has chronic total occlusion of the right coronary  artery, and had a percutaneous coronary intervention of the LAD in June  2007, at which time he had cutting balloon angioplasty of a tight lesion  in the diagonal branch of the LAD.  There was residual 70% to 80%  stenosis in the mid LAD, which we elected to treat medically.   He has done well since that time, and had a Myoview scan done prior to  this visit.  He had fairly good exercise tolerance and exercised 11  minutes; had a heart rate of 134, but in addition to inferior scarring,  he had new anterior ischemia.  He had no chest pain on the treadmill  test.   PAST MEDICAL HISTORY:  Significant for hyperlipidemia.   CURRENT MEDICATIONS:  Metoprolol, Vytorin, levothyroxine, aspirin,  Plavix, Niaspan and fish oil.   EXAMINATION:  Blood pressure 149/77 with a pulse of 56 and regular.  There was no venous distention.  The carotid pulses were full without  bruits.  CHEST:  Clear.  CARDIAC:  Rhythm was regular.  I could hear no murmurs or gallops.  ABDOMEN:  Soft with normal bowel sounds.  There was no  hepatosplenomegaly.  Peripheral pulses were full.  There was no peripheral edema.   IMPRESSION:  1. Coronary artery disease with chronic total occlusion of the right      coronary artery and percutaneous coronary intervention of a      diagonal branch of the left anterior descending June 2007 with      positive Myoview scan suggesting anterior ischemia.  2. Hyperlipidemia.   RECOMMENDATIONS:  Mr. Penton has new ischemia on Cardiolite in the  region of  his previous intervention.  I am not sure if this is  restenosis in the diagonal branch of the LAD or if this represents  progression of disease in the LAD itself.  I think he should be  evaluated further with angiography.  His clinical situation is quite  stable and he prefers to wait until after Christmas, so we will set this  up for May 03, 2006.  I told him that there is a possibility he  might require bypass surgery if his lesion is not favorable for a  percutaneous coronary intervention.  We will leave his medicines the  same except for cutting back his aspirin to 81 mg.     [name] Elvera Lennox Juanda Chance, MD, [facility]  Electronically Signed    BRB/MedQ  DD: 04/14/2006  DT: 04/14/2006  Job #: [id number]

## 2010-09-11 NOTE — Assessment & Plan Note (Signed)
[facility]                               LIPID CLINIC NOTE   NAME:JOHNSONNichollas, Perusse                     MRN:          [id number]  DATE:03/24/2006                            DOB:          October 25, 1940    First office visit for lipid clinic.   PAST MEDICAL HISTORY:  Hyperlipidemia, coronary artery disease status  post _CABG in 1993 with total occlusion of his RCA with collateral,  status post PCI in June 2007 with angioplasty to his diagonal.  He had  also hypothyroidism.   MEDICATIONS:  1. Metoprolol 25 mg once daily.  2. Vytorin 10/20 mg daily.  3. Synthroid 100 mcg daily.  4. Aspirin 325 mg daily.  5. Betacarotene daily.  6. Multivitamin daily.  7. Plavix 75 mg daily.  8. Niaspan 1 g at bedtime.   LABORATORY DATA:  Total cholesterol 104, triglyceride 52, HDL 37, LDL  56.  LFTs within normal limits.   VITAL SIGNS:  Weight 190 pounds, blood pressure 142/78, heart rate 72.   ASSESSMENT:  Mr. Droke is a pleasant gentleman who comes to the lipid  clinic today, referred by cardiologist, Dr. Juanda Chance, for control of his  hyperlipidemia.  He exercises 4 to 5 times a week.  He goes to cardiac  rehab in the maintenance program 3 times a week, and then the other 2  days he walks or runs on his treadmill from anywhere between 40 minutes  to 60 minutes.  He has adopted a low-fat diet.  He states that, after  his myocardial infarction in 1993 that he was very compliant with low-  fat diet and exercise regimen.  However, in the late 90s, he regressed  slightly, decreasing his exercise and cheating on his low-fat diet.  Again, he has become more diligent with low-fat diet and his exercise  regimen, after his myocardial infarction at the end of 2007.  He is  compliant with current medication regimen.  He has no chest pain, no  shortness of breath, no muscle aches or pains, and is really proud to  state all of the progress he has made on his diet and exercise  program.  He has lost 10 pounds since his last visit in October, and his lipid  panel is at goal, total cholesterol less than 200, triglycerides less  than 150.  LDL less than 70 and HDL although below goal, of greater than  40, significantly increased from 33 up 37 now.   PLAN:  1. Continue current medication regimen.  2. Continue exercise and diet changes.  3. Will follow up visit in 3 months to encourage continued progress      and, at that time, will      possibly go out to every 6 month visits if he maintains his life-      style modification and medication compliance.      Leota Sauers, PharmD  Electronically Signed      Jesse Sans. Daleen Squibb, MD, [facility]  Electronically Signed   LC/MedQ  DD: 03/24/2006  DT: 03/24/2006  Job #: [id number]

## 2010-09-11 NOTE — Assessment & Plan Note (Signed)
[facility]                             PRIMARY CARE OFFICE NOTE   NAME:[name], [name] [name] MEISTER                     MRN:          [id number]  DATE:02/16/2006                            DOB:          1940/05/04    The patient is a 70 year old male who presents for well examination.  Past  medical history, family history, and social history as per April 08, 2005  note.  He had a small myocardial infarction and angioplasty in June 2007.   ALLERGIES:  None.   CURRENT MEDICATIONS:  Reviewed with the patient.   REVIEW OF SYSTEMS:  He is exercising 4 days a week at cardiac rehab.  No  chest pain or shortness of breath.  No indigestion.  No blood in the stool.  Lost weight on a diet.  The rest is negative.   PHYSICAL EXAMINATION:  GENERAL:  He looks well.  VITAL SIGNS:  Blood pressure 126/67, pulse 58, temperature 98.9, weight 200.  HEENT:  With moist mucosa.  NECK:  Supple.  No thyromegaly or bruit.  LUNGS:  Clear.  No wheezes or rales.  HEART:  S1, S2, no murmur, no gallop.  ABDOMEN:  Soft, nontender.  No organomegaly or masses felt.  LOWER EXTREMITIES:  Without edema.  Calves nontender.  NEUROLOGIC:  He is alert, oriented, cooperative.  Denies being depressed.  RECTAL:  Reveals normal prostate.  Stool guaiac negative.  No masses or  nodules.  SKIN:  With aging changes and small angiomas.   LABORATORIES:  On February 14, 2006:  CBC normal.  CMET normal.  Cholesterol  102, HDL 55.  Other tests are normal.  Colonoscopy done in 2007.  EKG today  without acute changes.   ASSESSMENT AND PLAN:  1. Well examination.  Age/health-related issues discussed. Healthy      lifestyle discussed.  He refused flu and pneumonia shot today.  I gave      him information about Zostavax.  He will continue with his current      medications.  I asked him to take fish oil instead of beta carotene.      Repeat exam in 12 months.  2. Coronary artery disease, status post recent  myocardial infarction.  He      has a stress test      scheduled for November 2007.  Follow up with Dr. Juanda [name].  He will      continue with cardiac rehab/lipid clinic.            ______________________________  Georgina Quint. Plotnikov, MD      AVP/MedQ  DD:  02/16/2006  DT:  02/17/2006  Job #:  [id number]   cc:   [name] [name] [name]. [name] Chance, MD, [facility]

## 2010-09-11 NOTE — Assessment & Plan Note (Signed)
[facility]                            CARDIOLOGY OFFICE NOTE   NAME:[name] [name], [name] [name]                     MRN:          [id number]  DATE:05/13/2006                            DOB:          [birth year]    CLINICAL HISTORY:  [name] [name] is 70 years old and has documented  coronary disease of long standing. Total occlusion of the right coronary  artery. In June of last year he had cutting balloon angioplasty of an  ostial lesion in the diagonal branch of the LAD with residual 7-8 cm in  the mid LAD. He had a follow-up scan at 6 months which showed lateral  ischemia and was brought back in for catheterization and found to have  total occlusion in the diagonal branch of the LAD at the PTCA site. His  other anatomy was unchanged. We debated regarding surgery, medical  therapy or PCI of the mid LAD lesion and finally decided on medical  therapy.   He has done well since his procedure. He has not had any chest pain.   PAST MEDICAL HISTORY:  Significant for hyperlipidemia with a low HDL.   CURRENT MEDICATIONS:  Metoprolol, Vytorin, levothyroxine, beta carotene,  Plavix, Niaspan, fish oil and aspirin.   PHYSICAL EXAMINATION:  VITAL SIGNS: Blood pressure was 129/73 and the  pulse 52 and regular. His weight was 185 at home which he thinks  probably is his ideal weight.  He is 6 feet, 2 inches.  HEENT:  No venous distention. The carotid pulses were full without  bruits.  CHEST: Clear.  CARDIAC EXAM: Regular. I could hear no murmurs rule out gallops.  ABDOMEN: Soft, no organomegaly.  EXTREMITIES: Peripheral pulses were full and there was no peripheral  edema.   IMPRESSION:  1.Coronary artery disease status post percutaneous coronary  intervention of the diagonal branch of the LAD with subsequent re-  occlusion and coronary anatomy as described above.  1. Normal left ventricular function.  2. Hyperlipidemia.   RECOMMENDATIONS:  I think [name] [name] is doing well.  He does have a lot of  plaque in his arteries and a lot of disease and we are hopeful we can  still manage him medically. His risk factors are under pretty good  control with the exception of his HDL. He is on a gram of Niaspan and is  scheduled to return to the liver clinic next month for titration of that  in hopes that we can get him up to target on his HDL. I will not make  any other changes in his medications and will see him back in 4 months.    [name] Elvera Lennox Juanda Chance, MD, [facility]  Electronically Signed   BRB/MedQ  DD: 05/13/2006  DT: 05/13/2006  Job #: [id number]

## 2010-09-11 NOTE — Cardiovascular Report (Signed)
NAMEMarland Kitchen  [name] [name], [name] [name]   MEDICAL RECORD NO.:  [id number]          PATIENT TYPE:  INP   LOCATION:  6525                         FACILITY:  [facility]   PHYSICIAN:  Charlies Constable, M.D. [facility] DATE OF BIRTH:  August 18, 1940   DATE OF PROCEDURE:  09/29/2005  DATE OF DISCHARGE:  09/30/2005                              CARDIAC CATHETERIZATION   CLINICAL HISTORY:  [name] [name] is 89 years and has known coronary disease  with a total occlusion in the right coronary artery.  He was recently  admitted with new onset chest pain and had positive troponins consistent  with a non-ST-elevation myocardial infarction.  He is scheduled for  evaluation angiography.   PROCEDURE:  The procedure was performed via the right femoral artery with an  arterial sheath and 6 French preformed coronary catheters.  __________Omnipaque contrast was used.  After completion of the diagnostic  study, we made the decision to proceed with intervention on the lesion in  the diagonal branch of LAD.   The patient was enrolled in the champion trial and was randomized to Plavix  load versus champion study drug.  He had also been started on Angiomax and  was given and an additional bolus of 0.5 mg/kg in the lab prior to the  intervention.  We used Q-4 6-French guiding catheter with side holes and a  Prowater wire.  We crossed the lesion in the proximal portion in the  diagonal branch with the wire with some difficulty.  We initially went in  with a 2.0 x 15-mm Maverick and performed 1 inflation up to 8 atmospheres  for 30 seconds.  We then went in with a 2.25 x 15 mm cutting balloon and  performed 3 inflations up 10 atmospheres for about 30 seconds.  We then  elected to go in with a larger cutting balloon until the 2.5 x 15 mm cutting  balloon and performed 2 inflations up to 8 atmospheres for 30 seconds.  Final diagnostic studies were then performed through the guiding catheter.   We elected to use a  cutting balloon rather than stent because of the concern  about compromising the LAD with placing of the stent in the ostium of the  diagonal branch.  There was some plaque, although it was not severe in the  LAD at that location.   The patient tolerated the procedure well and left the laboratory in  satisfactory condition.   RESULTS:  The left main coronary artery:  The left main coronary artery was  free of significant disease.   The left anterior descending artery:  The left anterior descending artery  gave rise to a large diagonal branch, a large septal perforator and then a  small septal perforator.  The artery was diffusely irregular, and there was  70-80% stenosis in the mid vessel.  There was 95% stenosis right near the  ostium of the diagonal branch with some disease extending to the ostium.   The circumflex artery gave rise to a ramus branch and an A-V branch which  terminated into a marginal branch and a posterolateral branch.  There was  50% stenosis in the ramus branch.  There was 30% stenosis in the proximal  circumflex artery.   The right coronary artery.  The right coronary artery is completely occluded  in its mid portion after a right ventricular branch.  This right coronary  artery consisted of a posterior descending and 3 posterior lateral branches.  These filled via collaterals from the right coronary and the circumflex  artery.   The left ventriculogram.  The left ventriculogram performed in the RAO  projection showed good wall motion with no areas of hypokinesis.  The  estimated ejection fraction was 60%.   Following PTCA of the lesion in the diagonal branch of the LAD, this  stenosis improved from 95% to 10%.   CONCLUSION:  1.  Coronary artery disease with 70-80% stenosis in the mid LAD, 95%      stenosis in the first large diagonal branch, 50% stenosis in the ramus      branch of the circumflex artery and 30% narrowing in the proximal      circumflex  artery and chronic total occlusion of the right coronary      artery with normal left ventricular function.  2.  Percutaneous coronary intervention of the lesion in the diagonal branch      of the left anterior descending using a cutting balloon with improvement      in sentinel narrowing from 95% to 10%.   DISPOSITION:  The patient returned today for further observation.   ADDENDUM:  Compared to the study performed in 1998, the lesion in the mid  LAD was worse, and the diagonal lesion was worse.           ______________________________  Charlies Constable, M.D. [facility]     BB/MEDQ  D:  09/29/2005  T:  09/30/2005  Job:  [id number]   cc:   Georgina Quint. Plotnikov, M.D. [facility]  [address]  [place]  [place] [address]   Cardiopulmonary lab

## 2010-09-11 NOTE — Assessment & Plan Note (Signed)
[facility]                              CARDIOLOGY OFFICE NOTE   NAME:[name] [name], [name] [name]                     MRN:          [id number]  DATE:02/03/2006                            DOB:          1940/12/27    [name] [name] comes in to see [place] here at the [facility] for the first time  today.  He was referred to [place] by Dr. Charlies Constable due to documented coronary  artery disease and low HDL.   PAST MEDICAL HISTORY:  1. Coronary artery disease.  2. Hyperlipidemia.  3. Hypothyroidism.   CURRENT MEDICATIONS:  For cholesterol including,  1. Vytorin 10/20, one daily.  2. Niaspan 500 mg daily.  3. He also takes flax seed oil.  Other medications include:  1. Multivitamin.  2. Synthroid 100 mcg daily.  3. Toprol XL 25 mg daily.  4. Enteric coated aspirin 325 mg daily.  5. Betacarotene supplement.   HE HAS NO KNOWN DRUG ALLERGIES.   SOCIAL HISTORY:  He does not use any tobacco products.  His diet and  exercise seem pretty good.  He has been attending the [facility] program and has been exercising quite regularly and implementing the  dietary changes that were suggested by the nutritionists there.   PHYSICAL EXAMINATION:  VITAL SIGNS:  Weight is 198 pounds, blood pressure is  142/76, heart rate is 72.   LABORATORY DATA:  Total cholesterol 115, triglycerides 87, HDL 33, LDL 65.  Liver function tests are within normal limits.   ASSESSMENT:  [name] [name] is tolerating his Vytorin and Niaspan.  He takes  his Niaspan at bedtime and has not experienced any flushing reactions or any  other problems with it.  His triglycerides and LDL are at goal.  His HDL has  improved from 29 to 33, after starting Niaspan but is not yet at goal of  greater than 40.   PLAN:  Continue with his diet and exercise programs.  Continue the Vytorin  10/20 but we are going to increase the Niaspan to 1 gram daily at bedtime.  We are going to follow up with him in  6  weeks with a lipid and liver panel and make any changes that are  necessary.  He was encouraged to call us if any problems or concerns in the  meantime.      ______________________________  Charolotte Eke, PharmD    ______________________________  Rollene Rotunda, MD, [facility]    TP/MedQ  DD:  02/03/2006  DT:  02/04/2006  Job #:  [id number]

## 2010-09-11 NOTE — Assessment & Plan Note (Signed)
[facility]                               LIPID CLINIC NOTE   NAME:[name] [name]                     MRN:          [id number]  DATE:06/20/2007                            DOB:          1941-02-26    Mr. [name] is seen back in lipid clinic further evaluation medication  titration associated with hyperlipidemia in the setting of documented  coronary disease.  He is a patient Dr. Juanda Chance.  He has a chronic total  occlusion of the RCA and in June 2007 underwent cutting balloon  angioplasty to the diagonal branch of the LAD.  His restaging January  2008 at which time the diagonal branch had occluded 70-80% narrowing in  the mid LAD.  Medical therapy was the determined course of therapy.  Mr.  [name] has been compliant with his medications.  He has been feeling  and doing well overall.  He has had problems following low cholesterol  diet.  He is working to do well.  He is working to improve overall.   PAST MEDICAL HISTORY:  As noted above.  Current medications include  metoprolol 25 mg daily, levothyroxine 100 mcg daily.  Beta-carotene was  discontinued, multivitamin daily, Plavix 75 mg daily, fish oil, omega 3  one twice daily, aspirin a 1 mg daily and Zocor 20/1000 two tablets  daily.   REVIEW OF SYSTEMS:  As stated HPI, otherwise negative.   PHYSICAL EXAM:  Weight today in the office is 198 pounds, blood pressure  is 128/72, heart rate is 60 and regular.   Labs from June 06, 2007 reveal normal LFTs.  Total cholesterol 136,  triglycerides 74, HDL 47.1, LDL 74.   ASSESSMENT:  The patient has primary disease and is seen for secondary  risk reduction.  I am thrilled with his improvement in his overall  cholesterol panel.  He will continue on Simcor 2 tablets daily at  bedtime.  Will call questions or problems in the meantime.  I appreciate  opportunity to participate in this kind gentleman's care.  Will  see  back in 4 months for further follow  up and lab assessment.      Shelby Dubin, PharmD, BCPS, CPP  Electronically Signed      Arturo Morton. Riley Kill, MD, [facility]  Electronically Signed   MP/MedQ  DD: 06/20/2007  DT: 06/20/2007  Job #: [id number]   cc:   Georgina Quint. Plotnikov, MD  Everardo Beals. Juanda Chance, MD, [facility]

## 2010-09-11 NOTE — Assessment & Plan Note (Signed)
[facility]                          CHRONIC HEART FAILURE NOTE   NAME:Godsil, CABOT CROMARTIE                     MRN:          [id number]  DATE:06/23/2006                            DOB:          [birth year]    Mr. Patel is seen back in the lipid clinic for further evaluation and  medication titration associated with his hyperlipidemia in the setting  of documented coronary disease.   The patient is currently prescribed to be taking:  1. Vytorin 10/20 one table tablet.  2. Niaspan 1 gm daily at bedtime.  3. Fish oil twice daily.   The patient has not been compliant with his therapy.  Significantly, he  did not use tobacco products, but does have 1 to 2 alcoholic drinks,  usually beer, each day.  The patient's diet review includes oatmeal and  a piece of toast or bagel for breakfast.  Lunch:  That is sandwich or  soup and whole grain bread with almonds and flax seed, and suppers with  salmon, fish twice a week.  No fried foods.  No vegetables.  Exercise  includes cardiac rehab on Mondays, Tuesdays and Thursdays.  Other days  he exercises at home using his treadmill and rowing for at least 30  minutes.   REVIEW OF SYSTEMS:  Pertinent for those items noted above, otherwise  negative.   PHYSICAL EXAMINATION:  Patient's weight today is 188 pounds.  Blood  pressure is 129/73 in the left arm, and 130/72 in the right arm.  Heart  rate is 52 to 54.  LABS:  Obtained June 03, 2006 revealed total cholesterol of 106,  triglycerides 88, HDL 37.8, LDL 51.  LFTs are within normal limits.   ASSESSMENT:  The patient has a well-controlled diet, is exercising  daily, and his labs at goal.  His HDL is still not at goal with a goal  of 40.   PLAN:  Is for the patient to increase his Niaspan to 1500 mg daily at  bedtime.  He will continue with current diet and continue with current  exercise.  He will followup in 6 months.  We will check labs in 6 weeks  to verify  compliance with this therapy, and no adverse affects.      Shelby Dubin, PharmD, BCPS, CPP  Electronically Signed      Rollene Rotunda, MD, [facility]  Electronically Signed   MP/MedQ  DD: 07/01/2006  DT: 07/02/2006  Job #: [id number]   cc:   Everardo Beals. Juanda Chance, MD, [facility]

## 2010-09-11 NOTE — Cardiovascular Report (Signed)
[name] [name], [name] [name]              ACCOUNT NO.:  [id number]   MEDICAL RECORD NO.:  [id number]          PATIENT TYPE:  OIB   LOCATION:  2899                         FACILITY:  [facility]   PHYSICIAN:  Everardo Beals. Juanda Chance, MD, FACCDATE OF BIRTH:  10/30/1940   DATE OF PROCEDURE:  05/03/2006  DATE OF DISCHARGE:                            CARDIAC CATHETERIZATION   CLINICAL HISTORY:  Mr. Chatterjee is 70 years old and has known coronary  disease with longstanding total occlusion of right coronary.  In June  2007, we performed cutting balloon angioplasty of a near ostial lesion  of a diagonal branch of the LAD.  He had residual 70-80% narrowing in  the mid LAD.  He has done well since that time and recently had a  Myoview scan which showed anterior as well as inferior ischemia which  was a new finding.  For this reason, we brought him in for evaluation  with angiography.   PROCEDURE:  The procedure was performed via the right femoral arteries  and arterial sheath and 6 French preformed coronary catheters.  A front  wall arterial puncture was performed and Omnipaque contrast was used.  Distal aortogram was performed to rule out abdominal aortic aneurysm.  A  subclavian injection was performed to assess the internal mammary artery  for its suitability for bypass grafting.  The right femoral artery was  closed with Angioseal at the end of the procedure.  The patient  tolerated the procedure well and left the laboratory in satisfactory  condition.   RESULTS:  The aortic pressure is 135/67 with mean of 96 and left  ventricle pressure was 135/12.   Left main coronary artery:  The left main coronary artery was free of  significant disease.   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  There was a diagonal branch at the PTA  site which had a flush occlusion and filled via collaterals.  There was  40% narrowing in the proximal LAD near the  takeoff of the diagonal  branch.  There was 70-80% narrowing in the mid LAD.   Circumflex artery:  The circumflex artery gave rise to a ramus branch  and atrial branch and two posterolateral branches.  There was 40%  narrowing in the proximal portion of the ramus branch.  There was 40%  narrowing in the mid circumflex artery.   Right coronary artery:  The right coronary artery was completed occluded  proximally after the right ventricular branch.  Distal right coronary  artery consists of a posterior descending and two posterolateral  branches which filled via collaterals from primarily the circumflex  artery but also from septal perforators of the LAD and from the right  ventricular branch of the proximal right coronary artery.   Left ventriculogram:  The left ventriculogram was performed in the RAO  projection showed good wall motion with no areas of hypokinesis.  Estimated ejection fraction was 60%.   Distal aortogram:  A distal aortogram was performed which showed patent  renal arteries and no significant aortoiliac obstruction.   CONCLUSION:  Coronary  artery disease status post prior percutaneous  coronary intervention as described above with total occlusion of the  diagonal branch of the left anterior descending at the percutaneous  transluminal coronary angioplasty site, 40% proximal and 70-80% stenosis  in the mid left anterior descending, 40% narrowing in the ramus branch  and 40% narrowing in the proximal portion of the circumflex artery and  total occlusion of right femoral was normal left ventricular function.   RECOMMENDATIONS:  The patient has occluded the diagonal branch of the  PTCA site.  He is currently having no symptoms but has ischemia in the  anterior wall.  I would like to review the scan to see if the ischemia  is restricted to the diagonal branch.  There is no mention of the apex  being involved so I suspect this is the case.  Options would include  bypass  surgery, medical therapy or PCI of the mid LAD.  I will get  opinions from my colleagues and review the scan before I making a final  decision.      [name] Elvera Lennox Juanda Chance, MD, [facility]  Electronically Signed     BRB/MEDQ  D:  05/03/2006  T:  05/03/2006  Job:  [id number]   cc:   Cardiopulmonary Lab

## 2010-10-16 ENCOUNTER — Telehealth: Payer: Self-pay | Admitting: *Deleted

## 2010-10-16 ENCOUNTER — Telehealth: Payer: Self-pay | Admitting: Cardiovascular Disease

## 2010-10-16 DIAGNOSIS — I2510 Atherosclerotic heart disease of native coronary artery without angina pectoris: Secondary | ICD-10-CM

## 2010-10-16 MED ORDER — CLOPIDOGREL BISULFATE 75 MG PO TABS: 75.0000 mg | ORAL_TABLET | Freq: Every day | ORAL | Status: DC

## 2010-10-16 NOTE — Telephone Encounter (Signed)
Pt's wife calling about plavix refill--called [facility] and they stated they had received RX for plavix and would be sending out 6/29--called # given by wife and got pt's office#--LM on identified voice mail that RX will be sent from [facility] on 6/29--nt

## 2010-10-16 NOTE — Telephone Encounter (Signed)
Pt needs a refill auth sent to [facility] for generic plavix per [facility] they have sent several request and we have not responded. Member ID [id number]

## 2011-01-07 ENCOUNTER — Encounter: Payer: Self-pay | Admitting: Internal Medicine

## 2011-01-07 ENCOUNTER — Ambulatory Visit (INDEPENDENT_AMBULATORY_CARE_PROVIDER_SITE_OTHER): Payer: Medicare Other | Admitting: Internal Medicine

## 2011-01-07 VITALS — BP 138/72 | HR 58 | Temp 98.3°F | Resp 16 | Wt 220.0 lb

## 2011-01-07 DIAGNOSIS — J019 Acute sinusitis, unspecified: Secondary | ICD-10-CM

## 2011-01-07 MED ORDER — MOXIFLOXACIN HCL 400 MG PO TABS: 400.0000 mg | ORAL_TABLET | Freq: Every day | ORAL | Status: AC

## 2011-01-07 NOTE — Patient Instructions (Signed)

## 2011-01-07 NOTE — Progress Notes (Signed)
Subjective:    Patient ID: [name] [name], male    DOB: [birth year], 70 y.o.   MRN: [id number]  Sinusitis This is a recurrent problem. The current episode started in the past 7 days. The problem is unchanged. There has been no fever. His pain is at a severity of 0/10. He is experiencing no pain. Associated symptoms include congestion (thick yellow nasal phlegm), ear pain and sinus pressure. Pertinent negatives include no chills, coughing, diaphoresis, headaches, hoarse voice, neck pain, shortness of breath, sneezing, sore throat or swollen glands. Past treatments include spray decongestants. The treatment provided no relief.      Review of Systems  Constitutional: Negative for fever, chills, diaphoresis, activity change, appetite change, fatigue and unexpected weight change.  HENT: Positive for ear pain, nosebleeds, congestion (thick yellow nasal phlegm), rhinorrhea, postnasal drip and sinus pressure. Negative for sore throat, hoarse voice, facial swelling, sneezing, drooling, mouth sores, trouble swallowing, neck pain, neck stiffness, dental problem, voice change, tinnitus and ear discharge.   Eyes: Negative for photophobia, pain, discharge, redness, itching and visual disturbance.  Respiratory: Negative for apnea, cough, choking, chest tightness, shortness of breath, wheezing and stridor.   Cardiovascular: Negative.   Gastrointestinal: Negative.   Genitourinary: Negative.   Musculoskeletal: Negative.   Skin: Negative.   Neurological: Negative.  Negative for headaches.  Hematological: Negative for adenopathy. Does not bruise/bleed easily.  Psychiatric/Behavioral: Negative.        Objective:   Physical Exam  Vitals reviewed. Constitutional: He is oriented to person, place, and time. He appears well-developed and well-nourished. No distress.  HENT:  Head: No trismus in the jaw.  Right Ear: Hearing, tympanic membrane, external ear and ear canal normal.  Left Ear: Hearing, tympanic  membrane, external ear and ear canal normal.  Nose: Mucosal edema and rhinorrhea present. No nose lacerations, sinus tenderness, nasal deformity, septal deviation or nasal septal hematoma. No epistaxis.  No foreign bodies. Right sinus exhibits maxillary sinus tenderness. Right sinus exhibits no frontal sinus tenderness. Left sinus exhibits maxillary sinus tenderness. Left sinus exhibits no frontal sinus tenderness.  Mouth/Throat: Oropharynx is clear and moist and mucous membranes are normal. Mucous membranes are not pale, not dry and not cyanotic. No oral lesions. No uvula swelling. No oropharyngeal exudate, posterior oropharyngeal edema, posterior oropharyngeal erythema or tonsillar abscesses.  Eyes: Conjunctivae are normal. Right eye exhibits no discharge. Left eye exhibits no discharge. No scleral icterus.  Neck: Normal range of motion. Neck supple. No JVD present. No tracheal deviation present. No thyromegaly present.  Cardiovascular: Normal rate, regular rhythm, normal heart sounds and intact distal pulses.  Exam reveals no gallop and no friction rub.   No murmur heard. Pulmonary/Chest: Effort normal and breath sounds normal. No stridor. No respiratory distress. He has no wheezes. He has no rales. He exhibits no tenderness.  Abdominal: Soft. Bowel sounds are normal. He exhibits no distension and no mass. There is no tenderness. There is no rebound and no guarding.  Musculoskeletal: Normal range of motion. He exhibits no edema and no tenderness.  Lymphadenopathy:    He has no cervical adenopathy.  Neurological: He is oriented to person, place, and time. He displays normal reflexes. He exhibits normal muscle tone. Coordination normal.  Skin: Skin is warm and dry. No rash noted. He is not diaphoretic. No erythema. No pallor.  Psychiatric: He has a normal mood and affect. His behavior is normal. Judgment and thought content normal.          Assessment & Plan:

## 2011-01-07 NOTE — Assessment & Plan Note (Signed)
Start avelox 

## 2011-03-15 ENCOUNTER — Ambulatory Visit (INDEPENDENT_AMBULATORY_CARE_PROVIDER_SITE_OTHER): Payer: Medicare Other | Admitting: Internal Medicine

## 2011-03-15 ENCOUNTER — Encounter: Payer: Self-pay | Admitting: Internal Medicine

## 2011-03-15 VITALS — BP 152/80 | HR 64 | Temp 97.9°F | Wt 224.0 lb

## 2011-03-15 DIAGNOSIS — J019 Acute sinusitis, unspecified: Secondary | ICD-10-CM

## 2011-03-15 MED ORDER — PROMETHAZINE-CODEINE 6.25-10 MG/5ML PO SYRP: 5.0000 mL | ORAL_SOLUTION | ORAL | Status: AC | PRN

## 2011-03-15 MED ORDER — MOXIFLOXACIN HCL 400 MG PO TABS: 400.0000 mg | ORAL_TABLET | Freq: Every day | ORAL | Status: AC

## 2011-03-15 NOTE — Assessment & Plan Note (Signed)
Avelox Prom-cod

## 2011-03-15 NOTE — Progress Notes (Signed)
  Subjective:    Patient ID: [name] [name], male    DOB: [birth year], 70 y.o.   MRN: [id number]  HPI   HPI  C/o URI sx's x   5 days. C/o ST, sinusitis. Not better with OTC medicines and Amoxicillin from UC. Actually, the patient is getting worse.   Review of Systems  Constitutional: Positive for fever, chills and fatigue.  HENT: Positive for congestion, rhinorrhea, sneezing and postnasal drip - green.   Eyes: Positive for photophobia and pain. Negative for discharge and visual disturbance.  Respiratory: Positive for cough  Gastrointestinal: Negative for vomiting, abdominal pain, diarrhea and abdominal distention.  Genitourinary: Negative for dysuria and difficulty urinating.  Skin: Negative for rash.  Neurological: Positive for dizziness, weakness and light-headedness.      Review of Systems     Objective:   Physical Exam  Constitutional: He appears well-developed and well-nourished. No distress.  HENT:       L TM red eryth throat  Neck: No tracheal deviation present.  Pulmonary/Chest: He has no wheezes. He has no rales.  Neurological: No cranial nerve deficit.  Skin: He is not diaphoretic.          Assessment & Plan:

## 2011-03-30 ENCOUNTER — Ambulatory Visit (INDEPENDENT_AMBULATORY_CARE_PROVIDER_SITE_OTHER): Payer: Medicare Other | Admitting: Cardiovascular Disease

## 2011-03-30 ENCOUNTER — Encounter: Payer: Self-pay | Admitting: Cardiovascular Disease

## 2011-03-30 DIAGNOSIS — E785 Hyperlipidemia, unspecified: Secondary | ICD-10-CM

## 2011-03-30 DIAGNOSIS — I2510 Atherosclerotic heart disease of native coronary artery without angina pectoris: Secondary | ICD-10-CM

## 2011-03-30 DIAGNOSIS — I10 Essential (primary) hypertension: Secondary | ICD-10-CM

## 2011-03-30 MED ORDER — NITROGLYCERIN 0.4 MG SL SUBL: 0.4000 mg | SUBLINGUAL_TABLET | SUBLINGUAL | Status: DC | PRN

## 2011-03-30 NOTE — Assessment & Plan Note (Signed)
Well controlled.  Continue current medications and low sodium Dash type diet.    

## 2011-03-30 NOTE — Patient Instructions (Signed)
Your physician wants you to follow-up in: 6  Months.  You will receive a reminder letter in the mail two months in advance. If you don't receive a letter, please call our office to schedule the follow-up appointment.  Your physician recommends that you continue on your current medications as directed. Please refer to the Current Medication list given to you today.   Have lab work drawn at [facility]

## 2011-03-30 NOTE — Assessment & Plan Note (Signed)
Cholesterol is at goal.  Continue current dose of statin and diet Rx.  No myalgias or side effects.  F/U  LFT's in 6 months. Lab Results  Component Value Date   LDLCALC 68 06/08/2010

## 2011-03-30 NOTE — Progress Notes (Signed)
Mr. Dimattia is 70 yo previously seen by Dr Juanda Chance. [name] He has documented coronary disease and has chronic occlusion of the right coronary had cutting balloon angioplasty to the diagonal branch of the LAD.  This occluded in 2008.  F/U cath for abnormal ETT 11/11 showed occluded RCA and D1 with 70% mid LAD that Dr Juanda Chance decided to manage medically.  ROS: Denies fever, malais, weight loss, blurry vision, decreased visual acuity, cough, sputum, SOB, hemoptysis, pleuritic pain, palpitaitons, heartburn, abdominal pain, melena, lower extremity edema, claudication, or rash.  All other systems reviewed and negative  General: Affect appropriate Healthy:  appears stated age HEENT: normal Neck supple with no adenopathy JVP normal no bruits no thyromegaly Lungs clear with no wheezing and good diaphragmatic motion Heart:  S1/S2 no murmur,rub, gallop or click PMI normal Abdomen: benighn, BS positve, no tenderness, no AAA no bruit.  No HSM or HJR Distal pulses intact with no bruits No edema Neuro non-focal Skin warm and dry No muscular weakness   Current Outpatient Prescriptions  Medication Sig Dispense Refill  . aspirin 81 MG tablet Take 81 mg by mouth daily.        . Cholecalciferol (VITAMIN D3) 1000 UNITS CAPS Take 1 capsule by mouth daily.        . clopidogrel (PLAVIX) 75 MG tablet Take 1 tablet (75 mg total) by mouth daily.  90 tablet  3  . halobetasol (ULTRAVATE) 0.05 % cream Use as directed       . levothyroxine (SYNTHROID, LEVOTHROID) 100 MCG tablet TAKE 1 TABLET DAILY  90 tablet  2  . metoprolol succinate (TOPROL-XL) 25 MG 24 hr tablet TAKE 1 TABLET DAILY  90 tablet  2  . mometasone (ELOCON) 0.1 % cream Apply topically 2 (two) times daily.        . Multiple Vitamins-Minerals (CENTRUM SILVER PO) Take 1 tablet by mouth daily.        . niacin-simvastatin (SIMCOR) 1000-20 MG 24 hr tablet Take 2 tablets by mouth at bedtime.        . Omega-3 Fatty Acids (FISH OIL) 1200 MG CAPS Take 1 capsule by  mouth 2 (two) times daily.         Current Facility-Administered Medications  Medication Dose Route Frequency Provider Last Rate Last Dose  . 0.9 %  sodium chloride infusion  500 mL Intravenous Continuous Sheryn Bison, MD        Allergies  Review of patient's allergies indicates no known allergies.  Electrocardiogram:  NSR rate 59  RBBB  No change from ECG 02/10/10  Assessment and Plan

## 2011-03-30 NOTE — Assessment & Plan Note (Signed)
NO angina with good activity level.  Check P2Y platlet inhibitor test.  Refill for nitro called in

## 2011-04-01 ENCOUNTER — Other Ambulatory Visit: Payer: Self-pay | Admitting: Cardiovascular Disease

## 2011-04-06 ENCOUNTER — Other Ambulatory Visit: Payer: Self-pay | Admitting: Cardiovascular Disease

## 2011-04-06 ENCOUNTER — Other Ambulatory Visit: Payer: Self-pay | Admitting: *Deleted

## 2011-04-06 DIAGNOSIS — Z79899 Other long term (current) drug therapy: Secondary | ICD-10-CM

## 2011-04-06 LAB — PLATELET INHIBITION P2Y12: Platelet Function  P2Y12: 185 [PRU] — ABNORMAL LOW (ref 194–418)

## 2011-04-08 ENCOUNTER — Telehealth: Payer: Self-pay | Admitting: *Deleted

## 2011-04-08 NOTE — Telephone Encounter (Signed)
PT AWARE OF P2Y RESULTS  PER DR NISHAN PLAVIX DOING GOOD JOB OF THINNING BLOOD./CY

## 2011-06-07 ENCOUNTER — Other Ambulatory Visit: Payer: Self-pay | Admitting: Internal Medicine

## 2011-07-18 ENCOUNTER — Other Ambulatory Visit: Payer: Self-pay | Admitting: Internal Medicine

## 2011-08-16 ENCOUNTER — Ambulatory Visit (INDEPENDENT_AMBULATORY_CARE_PROVIDER_SITE_OTHER): Payer: Medicare Other | Admitting: Internal Medicine

## 2011-08-16 ENCOUNTER — Encounter: Payer: Self-pay | Admitting: Internal Medicine

## 2011-08-16 VITALS — BP 148/84 | HR 80 | Temp 97.5°F | Resp 16 | Ht 73.0 in | Wt 226.0 lb

## 2011-08-16 DIAGNOSIS — Z0000 Encounter for general adult medical examination without abnormal findings: Secondary | ICD-10-CM | POA: Insufficient documentation

## 2011-08-16 DIAGNOSIS — I10 Essential (primary) hypertension: Secondary | ICD-10-CM | POA: Diagnosis not present

## 2011-08-16 DIAGNOSIS — Z23 Encounter for immunization: Secondary | ICD-10-CM | POA: Diagnosis not present

## 2011-08-16 DIAGNOSIS — R7309 Other abnormal glucose: Secondary | ICD-10-CM | POA: Diagnosis not present

## 2011-08-16 DIAGNOSIS — E785 Hyperlipidemia, unspecified: Secondary | ICD-10-CM

## 2011-08-16 DIAGNOSIS — E039 Hypothyroidism, unspecified: Secondary | ICD-10-CM | POA: Diagnosis not present

## 2011-08-16 DIAGNOSIS — I2510 Atherosclerotic heart disease of native coronary artery without angina pectoris: Secondary | ICD-10-CM

## 2011-08-16 DIAGNOSIS — E669 Obesity, unspecified: Secondary | ICD-10-CM

## 2011-08-16 DIAGNOSIS — L570 Actinic keratosis: Secondary | ICD-10-CM

## 2011-08-16 MED ORDER — LOSARTAN POTASSIUM 100 MG PO TABS: 100.0000 mg | ORAL_TABLET | Freq: Every day | ORAL | Status: DC

## 2011-08-16 MED ORDER — MUPIROCIN 2 % EX OINT: TOPICAL_OINTMENT | CUTANEOUS | Status: AC

## 2011-08-16 NOTE — Assessment & Plan Note (Signed)
Added Losartan 

## 2011-08-16 NOTE — Assessment & Plan Note (Signed)
Continue with current prescription therapy as reflected on the Med list.  

## 2011-08-16 NOTE — Progress Notes (Signed)
Subjective:    Patient ID: [name] [name], male    DOB: Jul 27, 1940, 71 y.o.   MRN: [id number]  HPI  The patient is here for a wellness exam. The patient has been doing well overall without major physical or psychological issues going on lately.  The patient presents for a follow-up of  chronic hypertension, chronic dyslipidemia, CAD controlled with medicines  BP Readings from Last 3 Encounters:  08/16/11 148/84  03/30/11 152/77  03/15/11 152/80   Wt Readings from Last 3 Encounters:  08/16/11 226 lb (102.513 kg)  03/30/11 222 lb (100.699 kg)  03/15/11 224 lb (101.606 kg)      Review of Systems  Constitutional: Negative for appetite change, fatigue and unexpected weight change.  HENT: Negative for nosebleeds, congestion, sore throat, sneezing, trouble swallowing and neck pain.   Eyes: Negative for itching and visual disturbance.  Respiratory: Negative for cough.   Cardiovascular: Negative for chest pain, palpitations and leg swelling.  Gastrointestinal: Negative for nausea, diarrhea, blood in stool and abdominal distention.  Genitourinary: Negative for frequency and hematuria.  Musculoskeletal: Negative for back pain, joint swelling and gait problem.  Skin: Negative for rash.  Neurological: Negative for dizziness, tremors, speech difficulty and weakness.  Psychiatric/Behavioral: Negative for sleep disturbance, dysphoric mood and agitation. The patient is not nervous/anxious.        Objective:   Physical Exam  Constitutional: He is oriented to person, place, and time. He appears well-developed and well-nourished. No distress.  HENT:  Head: Normocephalic and atraumatic.  Right Ear: External ear normal.  Left Ear: External ear normal.  Nose: Nose normal.  Mouth/Throat: Oropharynx is clear and moist. No oropharyngeal exudate.  Eyes: Conjunctivae and EOM are normal. Pupils are equal, round, and reactive to light. Right eye exhibits no discharge. Left eye exhibits no  discharge. No scleral icterus.  Neck: Normal range of motion. Neck supple. No JVD present. No tracheal deviation present. No thyromegaly present.  Cardiovascular: Normal rate, regular rhythm, normal heart sounds and intact distal pulses.  Exam reveals no gallop and no friction rub.   No murmur heard. Pulmonary/Chest: Effort normal and breath sounds normal. No stridor. No respiratory distress. He has no wheezes. He has no rales. He exhibits no tenderness.  Abdominal: Soft. Bowel sounds are normal. He exhibits no distension and no mass. There is no tenderness. There is no rebound and no guarding.  Genitourinary: Rectum normal, prostate normal and penis normal. Guaiac negative stool. No penile tenderness.  Musculoskeletal: Normal range of motion. He exhibits no edema and no tenderness.  Lymphadenopathy:    He has no cervical adenopathy.  Neurological: He is alert and oriented to person, place, and time. He has normal reflexes. No cranial nerve deficit. He exhibits normal muscle tone. Coordination normal.  Skin: Skin is warm and dry. No rash noted. He is not diaphoretic. No erythema. No pallor.  Psychiatric: He has a normal mood and affect. His behavior is normal. Judgment and thought content normal.    Lab Results  Component Value Date   WBC 6.0 06/08/2010   HGB 14.6 06/08/2010   HCT 42.1 06/08/2010   PLT 179.0 06/08/2010   GLUCOSE 91 06/08/2010   CHOL 123 06/08/2010   TRIG 89.0 06/08/2010   HDL 36.90* 06/08/2010   LDLCALC 68 06/08/2010   ALT 31 06/08/2010   AST 34 06/08/2010   NA 135 06/08/2010   K 4.4 06/08/2010   CL 99 06/08/2010   CREATININE 0.9 06/08/2010   BUN 13 06/08/2010  CO2 29 06/08/2010   TSH 4.28 06/08/2010   PSA 0.44 06/08/2010   INR 1.0 ratio 03/06/2010   HGBA1C 5.8 06/08/2010         Assessment & Plan:

## 2011-08-16 NOTE — Patient Instructions (Signed)
BP Readings from Last 3 Encounters:  08/16/11 148/84  03/30/11 152/77  03/15/11 152/80   Wt Readings from Last 3 Encounters:  08/16/11 226 lb (102.513 kg)  03/30/11 222 lb (100.699 kg)  03/15/11 224 lb (101.606 kg)

## 2011-08-16 NOTE — Assessment & Plan Note (Signed)
Continue with current prescription therapy as reflected on the Med list. No CP 

## 2011-08-16 NOTE — Assessment & Plan Note (Signed)
Loose wt A1c 

## 2011-08-18 ENCOUNTER — Other Ambulatory Visit: Payer: Self-pay | Admitting: *Deleted

## 2011-08-18 DIAGNOSIS — Z0000 Encounter for general adult medical examination without abnormal findings: Secondary | ICD-10-CM

## 2011-08-18 DIAGNOSIS — N320 Bladder-neck obstruction: Secondary | ICD-10-CM

## 2011-08-18 DIAGNOSIS — I10 Essential (primary) hypertension: Secondary | ICD-10-CM

## 2011-08-18 DIAGNOSIS — E039 Hypothyroidism, unspecified: Secondary | ICD-10-CM

## 2011-08-18 DIAGNOSIS — R7309 Other abnormal glucose: Secondary | ICD-10-CM

## 2011-08-18 DIAGNOSIS — E785 Hyperlipidemia, unspecified: Secondary | ICD-10-CM

## 2011-08-18 DIAGNOSIS — Z125 Encounter for screening for malignant neoplasm of prostate: Secondary | ICD-10-CM

## 2011-08-18 DIAGNOSIS — Z0389 Encounter for observation for other suspected diseases and conditions ruled out: Secondary | ICD-10-CM

## 2011-08-20 ENCOUNTER — Other Ambulatory Visit (INDEPENDENT_AMBULATORY_CARE_PROVIDER_SITE_OTHER): Payer: Medicare Other

## 2011-08-20 ENCOUNTER — Telehealth: Payer: Self-pay | Admitting: *Deleted

## 2011-08-20 DIAGNOSIS — E785 Hyperlipidemia, unspecified: Secondary | ICD-10-CM

## 2011-08-20 DIAGNOSIS — Z0000 Encounter for general adult medical examination without abnormal findings: Secondary | ICD-10-CM | POA: Diagnosis not present

## 2011-08-20 DIAGNOSIS — E039 Hypothyroidism, unspecified: Secondary | ICD-10-CM | POA: Diagnosis not present

## 2011-08-20 DIAGNOSIS — R7309 Other abnormal glucose: Secondary | ICD-10-CM

## 2011-08-20 DIAGNOSIS — I10 Essential (primary) hypertension: Secondary | ICD-10-CM | POA: Diagnosis not present

## 2011-08-20 DIAGNOSIS — N320 Bladder-neck obstruction: Secondary | ICD-10-CM

## 2011-08-20 LAB — HEPATIC FUNCTION PANEL
AST: 32 U/L (ref 0–37)
Bilirubin, Direct: 0.1 mg/dL (ref 0.0–0.3)
Total Bilirubin: 0.7 mg/dL (ref 0.3–1.2)

## 2011-08-20 LAB — URINALYSIS, ROUTINE W REFLEX MICROSCOPIC
Leukocytes, UA: NEGATIVE
Specific Gravity, Urine: 1.02 (ref 1.000–1.030)
Urobilinogen, UA: 0.2 (ref 0.0–1.0)
pH: 6 (ref 5.0–8.0)

## 2011-08-20 LAB — CBC WITH DIFFERENTIAL/PLATELET
Basophils Absolute: 0 10*3/uL (ref 0.0–0.1)
Eosinophils Relative: 5 % (ref 0.0–5.0)
HCT: 44.3 % (ref 39.0–52.0)
Hemoglobin: 14.7 g/dL (ref 13.0–17.0)
Lymphocytes Relative: 28 % (ref 12.0–46.0)
Monocytes Relative: 9 % (ref 3.0–12.0)
Platelets: 202 10*3/uL (ref 150.0–400.0)
RDW: 14 % (ref 11.5–14.6)
WBC: 7.8 10*3/uL (ref 4.5–10.5)

## 2011-08-20 LAB — PSA: PSA: 0.67 ng/mL (ref 0.10–4.00)

## 2011-08-20 LAB — BASIC METABOLIC PANEL
BUN: 11 mg/dL (ref 6–23)
Chloride: 105 mEq/L (ref 96–112)
GFR: 97.25 mL/min (ref >60.00)
Potassium: 4.8 mEq/L (ref 3.5–5.1)

## 2011-08-20 LAB — LIPID PANEL
HDL: 40.9 mg/dL (ref >39.00)
Triglycerides: 88 mg/dL (ref 0.0–149.0)
VLDL: 17.6 mg/dL (ref 0.0–40.0)

## 2011-08-20 LAB — HEMOGLOBIN A1C: Hgb A1c MFr Bld: 6 % (ref 4.6–6.5)

## 2011-08-20 LAB — TSH: TSH: 3.32 u[IU]/mL (ref 0.35–5.50)

## 2011-08-20 NOTE — Telephone Encounter (Signed)
Left detailed mess informing pt of below.  

## 2011-08-20 NOTE — Telephone Encounter (Signed)
Message copied by Merrilyn Puma on Fri Aug 20, 2011  4:20 PM ------      Message from: Janeal Holmes      Created: Fri Aug 20, 2011  1:59 PM       Misty Stanley, please, inform patient that all labs are OK      Thank you!

## 2011-08-22 NOTE — Assessment & Plan Note (Addendum)
Cryo w/der

## 2011-08-22 NOTE — Assessment & Plan Note (Signed)

## 2011-09-15 ENCOUNTER — Other Ambulatory Visit: Payer: Self-pay | Admitting: Cardiovascular Disease

## 2011-09-22 ENCOUNTER — Encounter: Payer: Medicare Other | Attending: Internal Medicine | Admitting: *Deleted

## 2011-09-22 ENCOUNTER — Encounter: Payer: Self-pay | Admitting: *Deleted

## 2011-09-22 VITALS — Ht 73.0 in | Wt 210.5 lb

## 2011-09-22 DIAGNOSIS — Z713 Dietary counseling and surveillance: Secondary | ICD-10-CM | POA: Diagnosis not present

## 2011-09-22 DIAGNOSIS — E785 Hyperlipidemia, unspecified: Secondary | ICD-10-CM | POA: Insufficient documentation

## 2011-09-22 NOTE — Progress Notes (Signed)
Medical Nutrition Therapy:  Appt start time: 0830 end time:  0930.  Assessment:  Primary concerns today: Hyperlipidemia and obesity. Patient has a history of heart cath x 3 (1993, 2007, 2011) with history of heart attack. He attended cardiac rehab in 2007 and lost 25 pounds over 6 months (to 185 pounds). His weight has been stable at 210 pounds for the last 2 years. Patient has recently started exercising on the treadmill walking and jogging. He is familiar with a heart healthy diet, but needs some guidance to make additional changes. His goal is to lost 25-30 pounds.   Weight: 210.5 pounds BMI: 27.8  MEDICATIONS: Metoprolol, plavix, levothyroxine, simcor, omega 3 fish oil, Centrum Silver, vitamin D   DIETARY INTAKE:   Usual eating pattern includes 3 meals and 1-2 snacks per day.  24-hr recall:  B ( AM): sometimes skips, coffee with 1 tsp sugar and fat-free half and half, oatmeal with toast and jelly, or egg biscuit 2x/week ([employer]), egg beaters Snk ( AM): None  L ( PM): Egg/tuna salad sandwich, or veggie subs ([employer]), Soup (vegetable,vegetable beef) Snk ( PM): Fruit and nut bars D ( PM): Hamburger steak, homemade baked fries/sweet potatoes with vegetables, veggie pizza Snk ( PM): ice cream, Cheetos, chocolate balls Beverages: coffee, 1 beer at night (pint [employer] Light), Gatorade low calories, water  Usual physical activity: Treadmill, 3 days/week walking and jogging, 10 min warm up, 30 min workout, 10 cool down  Estimated energy needs: 1800 calories 248 g carbohydrates 90 g protein 50 g fat  Progress Towards Goal(s):  In progress.   Nutritional Diagnosis:  [place]-3.3 Overweight/obesity As related to history of excessive energy intake.  As evidenced by BMI 27.8.    Intervention:  Nutrition counseling. Discussed a heart healthy diet with the patient. We discussed limiting fat intake, choosing healthy fats, reducing sodium intake, and increasing fruit, vegetable, and fiber intake. We  also discussed strategies for weight loss, including reading food labels, monitoring portions, and choosing healthier snacks.   Goals:  1. Reduce sodium intake to 2000 mg/day by reading food labels and limiting salt added to foods.  2. Increase vegetable intake to at least 3 servings daily.  3. Increase fiber intake by increasing intake of fruits,vegetables, and whole grains.  4. Limit evening snacking and choose healthier options.  5. Weight loss of 3-4 pounds over 1 month (long term goal weight 185 pounds).  6. Continue to exercise on treadmill for 50-60 min 3 days weekly.   Handouts given during visit include:  Heart Healthy Eating handout  Monitoring/Evaluation:  Dietary intake, exercise, cholesterol, and body weight in 1 month(s).

## 2011-09-22 NOTE — Patient Instructions (Signed)
Goals:  1. Reduce sodium intake to 2000 mg/day by reading food labels and limiting salt added to foods.  2. Increase vegetable intake to at least 3 servings daily.  3. Increase fiber intake by increasing intake of fruits,vegetables, and whole grains.  4. Limit evening snacking and choose healthier options.  5. Weight loss of 3-4 pounds over 1 month (long term goal weight 185 pounds).  6. Continue to exercise on treadmill for 50-60 min 3 days weekly.

## 2011-10-25 ENCOUNTER — Encounter: Payer: Self-pay | Admitting: *Deleted

## 2011-10-25 ENCOUNTER — Encounter: Payer: Medicare Other | Attending: Internal Medicine | Admitting: *Deleted

## 2011-10-25 VITALS — Wt 210.5 lb

## 2011-10-25 DIAGNOSIS — Z713 Dietary counseling and surveillance: Secondary | ICD-10-CM | POA: Diagnosis not present

## 2011-10-25 DIAGNOSIS — E785 Hyperlipidemia, unspecified: Secondary | ICD-10-CM | POA: Diagnosis not present

## 2011-10-25 NOTE — Patient Instructions (Signed)
Goals:  1. Continue to reduce sodium intake to 2000 mg/day by reading food labels and limiting salt added to foods.  2. Continue to monitor food intake on a regular basis.  3. Weight loss of 3-4 pounds over 1 month (long term goal weight 185 pounds).  4. Continue to exercise on treadmill for 50-60 min 3 days weekly.

## 2011-10-25 NOTE — Progress Notes (Signed)
Medical Nutrition Therapy:  Appt start time: 0800 end time:  0830.  Assessment:  Primary concerns today: Hyperlipidemia and obesity. The patient reports that he has made several healthy changes. He is tracking his diet daily and counting calories. He is also tracking his physical activity. This has assisted him in his weight loss by making him more aware of the choices he makes and allows him to control his snacking. He does report that reducing his sodium intake is still difficult for him.   Weight: 210.5 pounds  DIETARY INTAKE:   Usual eating pattern includes 3 meals and 1-2 snacks per day.                                                                                                                                            24-hr recall:  See patient's food records in media tab. Usual calorie intake 1300-1700 kcal.   Usual physical activity: Walking and running on the treadmill, 50-60 minutes 5 days weekly, see record in media tab.   Estimated energy needs: 1800 calories  248 g carbohydrates  90 g protein  50 g fat  Progress Towards Goal(s):  Some progress.   Nutritional Diagnosis:  [place]-3.3 Overweight/obesity As related to history of excessive energy intake. As evidenced by BMI 27.8    Intervention:  Nutrition counseling. Patient was encouraged to continue to monitor food intake by recording foods eaten. We discussed using an online program ([employer]) to make tracking diet easier. It will also allow him to monitor fat and sodium intake to promote heart health.   Goals:  1. Continue to reduce sodium intake to 2000 mg/day by reading food labels and limiting salt added to foods.  2. Continue to monitor food intake on a regular basis.  3. Weight loss of 3-4 pounds over 1 month (long term goal weight 185 pounds).  4. Continue to exercise on treadmill for 50-60 min 3 days weekly.   Monitoring/Evaluation:  Dietary intake, exercise, cholesterol, and body weight prn.

## 2011-11-10 ENCOUNTER — Encounter: Payer: Self-pay | Admitting: Internal Medicine

## 2011-11-10 ENCOUNTER — Ambulatory Visit (INDEPENDENT_AMBULATORY_CARE_PROVIDER_SITE_OTHER): Payer: Medicare Other | Admitting: Internal Medicine

## 2011-11-10 VITALS — BP 112/70 | HR 60 | Temp 97.1°F | Resp 16 | Wt 209.8 lb

## 2011-11-10 DIAGNOSIS — J020 Streptococcal pharyngitis: Secondary | ICD-10-CM | POA: Diagnosis not present

## 2011-11-10 MED ORDER — AMOXICILLIN 875 MG PO TABS: 875.0000 mg | ORAL_TABLET | Freq: Two times a day (BID) | ORAL | Status: AC

## 2011-11-10 NOTE — Patient Instructions (Signed)

## 2011-11-10 NOTE — Progress Notes (Signed)
Subjective:    Patient ID: [name] [name], male    DOB: Oct 14, 1940, 71 y.o.   MRN: [id number]  Sore Throat  This is a new problem. The current episode started in the past 7 days. The problem has been gradually worsening. Neither side of throat is experiencing more pain than the other. The maximum temperature recorded prior to his arrival was 100 - 100.9 F. The fever has been present for 1 to 2 days. The pain is at a severity of 1/10. The pain is mild. Associated symptoms include swollen glands. Pertinent negatives include no abdominal pain, congestion, coughing, diarrhea, drooling, ear discharge, ear pain, headaches, hoarse voice, plugged ear sensation, neck pain, shortness of breath, stridor, trouble swallowing or vomiting. He has tried nothing for the symptoms.      Review of Systems  Constitutional: Positive for chills and fatigue. Negative for fever, diaphoresis, activity change, appetite change and unexpected weight change.  HENT: Positive for sore throat and postnasal drip. Negative for ear pain, nosebleeds, congestion, hoarse voice, rhinorrhea, sneezing, drooling, trouble swallowing, neck pain, neck stiffness, sinus pressure, tinnitus and ear discharge.   Eyes: Negative.   Respiratory: Negative for cough, chest tightness, shortness of breath and stridor.   Cardiovascular: Negative for chest pain, palpitations and leg swelling.  Gastrointestinal: Negative for nausea, vomiting, abdominal pain, diarrhea and abdominal distention.  Genitourinary: Negative.   Musculoskeletal: Positive for myalgias. Negative for back pain, joint swelling and gait problem.  Skin: Negative for color change, pallor, rash and wound.  Neurological: Negative.  Negative for headaches.  Hematological: Positive for adenopathy. Does not bruise/bleed easily.  Psychiatric/Behavioral: Negative.        Objective:   Physical Exam  Vitals reviewed. Constitutional: He is oriented to person, place, and time. Vital  signs are normal. He appears well-developed and well-nourished.  Non-toxic appearance. He does not have a sickly appearance. He does not appear ill. No distress.  HENT:  Head: Normocephalic and atraumatic. No trismus in the jaw.  Right Ear: Hearing, tympanic membrane, external ear and ear canal normal.  Left Ear: Hearing, external ear and ear canal normal.  Mouth/Throat: Mucous membranes are normal. Mucous membranes are not pale, not dry and not cyanotic. No oral lesions. No uvula swelling. Posterior oropharyngeal erythema present. No oropharyngeal exudate, posterior oropharyngeal edema or tonsillar abscesses.  Eyes: Conjunctivae are normal. Right eye exhibits no discharge. Left eye exhibits no discharge. No scleral icterus.  Neck: Normal range of motion. Neck supple. No JVD present. No tracheal deviation present. No thyromegaly present.  Cardiovascular: Normal rate, regular rhythm, normal heart sounds and intact distal pulses.  Exam reveals no gallop and no friction rub.   No murmur heard. Pulmonary/Chest: Effort normal and breath sounds normal. No stridor. No respiratory distress. He has no wheezes. He has no rales. He exhibits no tenderness.  Abdominal: Soft. Bowel sounds are normal. He exhibits no distension and no mass. There is no tenderness. There is no rebound and no guarding.  Musculoskeletal: Normal range of motion. He exhibits no edema and no tenderness.  Lymphadenopathy:       Head (right side): No submental, no submandibular, no tonsillar, no preauricular, no posterior auricular and no occipital adenopathy present.       Head (left side): No submental, no submandibular, no tonsillar, no preauricular, no posterior auricular and no occipital adenopathy present.    He has cervical adenopathy.       Right cervical: Superficial cervical adenopathy present. No deep cervical and no  posterior cervical adenopathy present.      Left cervical: Superficial cervical adenopathy present. No deep  cervical and no posterior cervical adenopathy present.    He has no axillary adenopathy.  Neurological: He is oriented to person, place, and time.  Skin: Skin is warm and dry. No rash noted. He is not diaphoretic. No erythema. No pallor.  Psychiatric: He has a normal mood and affect. His behavior is normal. Judgment and thought content normal.          Assessment & Plan:

## 2011-11-10 NOTE — Assessment & Plan Note (Signed)
Will treat with amoxil 

## 2011-12-12 ENCOUNTER — Other Ambulatory Visit: Payer: Self-pay | Admitting: Internal Medicine

## 2012-01-01 ENCOUNTER — Encounter: Payer: Self-pay | Admitting: Family Medicine

## 2012-01-01 ENCOUNTER — Ambulatory Visit (INDEPENDENT_AMBULATORY_CARE_PROVIDER_SITE_OTHER): Payer: Medicare Other | Admitting: Family Medicine

## 2012-01-01 VITALS — BP 128/68 | HR 59 | Temp 96.9°F | Wt 199.0 lb

## 2012-01-01 DIAGNOSIS — J329 Chronic sinusitis, unspecified: Secondary | ICD-10-CM

## 2012-01-01 DIAGNOSIS — J029 Acute pharyngitis, unspecified: Secondary | ICD-10-CM

## 2012-01-01 MED ORDER — MOMETASONE FUROATE 50 MCG/ACT NA SUSP: NASAL | Status: DC

## 2012-01-01 MED ORDER — AMOXICILLIN-POT CLAVULANATE 875-125 MG PO TABS: 1.0000 | ORAL_TABLET | Freq: Two times a day (BID) | ORAL | Status: AC

## 2012-01-01 NOTE — Patient Instructions (Signed)

## 2012-01-01 NOTE — Progress Notes (Signed)
  Subjective:     MOHD CLEMONS is a 71 y.o. male who presents for evaluation of sinus pain. Symptoms include: facial pain, headaches, post nasal drip, sinus pressure and sore throat. Onset of symptoms was 1 week ago. Symptoms have been gradually worsening since that time. Past history is significant for no history of pneumonia or bronchitis. Patient is a non-smoker.  The following portions of the patient's history were reviewed and updated as appropriate: allergies, current medications, past family history, past social history, past surgical history and problem list.  Review of Systems Pertinent items are noted in HPI.   Objective:    BP 128/68  Pulse 59  Temp 96.9 F (36.1 C) (Oral)  Wt 199 lb (90.266 kg)  SpO2 97% General appearance: alert, cooperative, appears stated age and no distress Ears: normal TM's and external ear canals both ears Nose: green discharge, moderate congestion, turbinates red, swollen, sinus tenderness bilateral Throat: abnormal findings: mild oropharyngeal erythema and pnd Neck: mild anterior cervical adenopathy, supple, symmetrical, trachea midline and thyroid not enlarged, symmetric, no tenderness/mass/nodules Lungs: clear to auscultation bilaterally    Assessment:    Acute bacterial sinusitis.    Plan:    Neti pot recommended. Instructions given. Nasal steroids per medication orders. Augmentin per medication orders.

## 2012-02-17 ENCOUNTER — Other Ambulatory Visit: Payer: Self-pay | Admitting: Internal Medicine

## 2012-03-14 ENCOUNTER — Other Ambulatory Visit: Payer: Self-pay | Admitting: *Deleted

## 2012-03-14 MED ORDER — NIACIN-SIMVASTATIN ER 1000-20 MG PO TB24: 2.0000 | ORAL_TABLET | Freq: Every day | ORAL | Status: DC

## 2012-03-23 ENCOUNTER — Ambulatory Visit: Payer: Medicare Other

## 2012-03-23 ENCOUNTER — Ambulatory Visit (INDEPENDENT_AMBULATORY_CARE_PROVIDER_SITE_OTHER): Payer: Medicare Other | Admitting: Emergency Medicine

## 2012-03-23 VITALS — BP 144/72 | HR 54 | Temp 98.0°F | Resp 16 | Ht 73.0 in | Wt 191.0 lb

## 2012-03-23 DIAGNOSIS — R220 Localized swelling, mass and lump, head: Secondary | ICD-10-CM

## 2012-03-23 DIAGNOSIS — J329 Chronic sinusitis, unspecified: Secondary | ICD-10-CM | POA: Diagnosis not present

## 2012-03-23 LAB — POCT CBC
HCT, POC: 47.7 % (ref 43.5–53.7)
Lymph, poc: 2.4 (ref 0.6–3.4)
MCHC: 31.7 g/dL — AB (ref 31.8–35.4)
MCV: 93.7 fL (ref 80–97)
MID (cbc): 0.7 (ref 0–0.9)
POC Granulocyte: 6.2 (ref 2–6.9)
POC LYMPH PERCENT: 25.6 %L (ref 10–50)
RDW, POC: 14.5 %

## 2012-03-23 MED ORDER — AMOXICILLIN-POT CLAVULANATE 875-125 MG PO TABS: 1.0000 | ORAL_TABLET | Freq: Two times a day (BID) | ORAL | Status: DC

## 2012-03-23 NOTE — Addendum Note (Signed)
Addended by: Cira Rue R on: 03/23/2012 11:20 AM   Modules accepted: Orders

## 2012-03-23 NOTE — Progress Notes (Signed)
  Subjective:    Patient ID: [name] [name], male    DOB: 1940-06-18, 71 y.o.   MRN: [id number]  HPI patient has a complicated history in that he has been having trouble with one of his teeth and into the periodontist for treatment. He took a course of amoxicillin and feels like his to this doing better. He went to sleep last night and felt well but woke up this morning with a puffiness underneath his right eye. He denies any new exposures to medicines or foods.    Review of Systems     Objective:   Physical Exam there is edema around the right eye and just beneath the right eye. His pupils are equal and reactive to light his TMs are clear his nose is normal examination of mouth reveals no abnormalities.  [facility] reading (PRIMARY) by  Dr.Somaly Marteney is an air-fluid level in the right maxillary sinus  Results for orders placed in visit on 03/23/12  POCT CBC      Component Value Range   WBC 9.3  4.6 - 10.2 K/uL   Lymph, poc 2.4  0.6 - 3.4   POC LYMPH PERCENT 25.6  10 - 50 %L   MID (cbc) 0.7  0 - 0.9   POC MID % 7.4  0 - 12 %M   POC Granulocyte 6.2  2 - 6.9   Granulocyte percent 67.0  37 - 80 %G   RBC 5.09  4.69 - 6.13 M/uL   Hemoglobin 15.1  14.1 - 18.1 g/dL   HCT, POC 40.9  81.1 - 53.7 %   MCV 93.7  80 - 97 fL   MCH, POC 29.7  27 - 31.2 pg   MCHC 31.7 (*) 31.8 - 35.4 g/dL   RDW, POC 91.4     Platelet Count, POC 253  142 - 424 K/uL   MPV 8.8  0 - 99.8 fL       Assessment & Plan:   Byrd Hesselbach' view shows a right maxillary sinusitis with significant air-fluid level. We'll treat with Augmentin 875 twice a day. Followup with Dr. Posey Rea

## 2012-03-28 ENCOUNTER — Ambulatory Visit (INDEPENDENT_AMBULATORY_CARE_PROVIDER_SITE_OTHER): Payer: Medicare Other | Admitting: Internal Medicine

## 2012-03-28 ENCOUNTER — Encounter: Payer: Self-pay | Admitting: Internal Medicine

## 2012-03-28 VITALS — BP 146/80 | HR 76 | Temp 97.2°F | Resp 16 | Wt 195.0 lb

## 2012-03-28 DIAGNOSIS — I10 Essential (primary) hypertension: Secondary | ICD-10-CM | POA: Diagnosis not present

## 2012-03-28 DIAGNOSIS — J019 Acute sinusitis, unspecified: Secondary | ICD-10-CM

## 2012-03-28 DIAGNOSIS — J329 Chronic sinusitis, unspecified: Secondary | ICD-10-CM | POA: Diagnosis not present

## 2012-03-28 MED ORDER — MOMETASONE FUROATE 50 MCG/ACT NA SUSP: NASAL | Status: DC

## 2012-03-28 MED ORDER — AMOXICILLIN-POT CLAVULANATE 875-125 MG PO TABS: 1.0000 | ORAL_TABLET | Freq: Two times a day (BID) | ORAL | Status: DC

## 2012-03-28 NOTE — Assessment & Plan Note (Signed)
Augmentin 20 d Nasonex Netty pot Afrin Probiotic

## 2012-03-28 NOTE — Patient Instructions (Addendum)
Mucinex Probiotic

## 2012-03-28 NOTE — Progress Notes (Signed)
Subjective:    Patient ID: [name] [name], male    DOB: 10/09/1940, 71 y.o.   MRN: [id number]  HPI C/o R sinus pain and sinusitis x 2 wks, recurrent. Nothing would drain out of R nostril... He was started on Augmentin by Dr Cleta Alberts. He is better. The patient presents for a follow-up of  chronic hypertension, chronic dyslipidemia, CAD controlled with medicines  BP Readings from Last 3 Encounters:  03/28/12 146/80  03/23/12 144/72  01/01/12 128/68   Wt Readings from Last 3 Encounters:  03/28/12 195 lb (88.451 kg)  03/23/12 191 lb (86.637 kg)  01/01/12 199 lb (90.266 kg)      Review of Systems  Constitutional: Negative for appetite change, fatigue and unexpected weight change.  HENT: Negative for nosebleeds, congestion, sore throat, sneezing, trouble swallowing and neck pain.   Eyes: Negative for itching and visual disturbance.  Respiratory: Negative for cough.   Cardiovascular: Negative for chest pain, palpitations and leg swelling.  Gastrointestinal: Negative for nausea, diarrhea, blood in stool and abdominal distention.  Genitourinary: Negative for frequency and hematuria.  Musculoskeletal: Negative for back pain, joint swelling and gait problem.  Skin: Negative for rash.  Neurological: Negative for dizziness, tremors, speech difficulty and weakness.  Psychiatric/Behavioral: Negative for sleep disturbance, dysphoric mood and agitation. The patient is not nervous/anxious.        Objective:   Physical Exam  Constitutional: He is oriented to person, place, and time. He appears well-developed and well-nourished. No distress.  HENT:  Head: Normocephalic and atraumatic.  Right Ear: External ear normal.  Left Ear: External ear normal.  Nose: Nose normal.  Mouth/Throat: Oropharynx is clear and moist. No oropharyngeal exudate.  Eyes: Conjunctivae normal and EOM are normal. Pupils are equal, round, and reactive to light. Right eye exhibits no discharge. Left eye exhibits no  discharge. No scleral icterus.  Neck: Normal range of motion. Neck supple. No JVD present. No tracheal deviation present. No thyromegaly present.  Cardiovascular: Normal rate, regular rhythm, normal heart sounds and intact distal pulses.  Exam reveals no gallop and no friction rub.   No murmur heard. Pulmonary/Chest: Effort normal and breath sounds normal. No stridor. No respiratory distress. He has no wheezes. He has no rales. He exhibits no tenderness.  Abdominal: Soft. Bowel sounds are normal. He exhibits no distension and no mass. There is no tenderness. There is no rebound and no guarding.  Genitourinary: Rectum normal, prostate normal and penis normal. Guaiac negative stool. No penile tenderness.  Musculoskeletal: Normal range of motion. He exhibits no edema and no tenderness.  Lymphadenopathy:    He has no cervical adenopathy.  Neurological: He is alert and oriented to person, place, and time. He has normal reflexes. No cranial nerve deficit. He exhibits normal muscle tone. Coordination normal.  Skin: Skin is warm and dry. No rash noted. He is not diaphoretic. No erythema. No pallor.  Psychiatric: He has a normal mood and affect. His behavior is normal. Judgment and thought content normal.    Lab Results  Component Value Date   WBC 9.3 03/23/2012   HGB 15.1 03/23/2012   HCT 47.7 03/23/2012   PLT 202.0 08/20/2011   GLUCOSE 93 08/20/2011   CHOL 122 08/20/2011   TRIG 88.0 08/20/2011   HDL 40.90 08/20/2011   LDLCALC 64 08/20/2011   ALT 32 08/20/2011   AST 32 08/20/2011   NA 142 08/20/2011   K 4.8 08/20/2011   CL 105 08/20/2011   CREATININE 0.8 08/20/2011  BUN 11 08/20/2011   CO2 29 08/20/2011   TSH 3.32 08/20/2011   PSA 0.67 08/20/2011   INR 1.0 ratio 03/06/2010   HGBA1C 6.0 08/20/2011    X ray: R sinusitis     Assessment & Plan:

## 2012-03-28 NOTE — Assessment & Plan Note (Signed)
Continue with current prescription therapy as reflected on the Med list. BP is ok at home 

## 2012-03-31 DIAGNOSIS — J329 Chronic sinusitis, unspecified: Secondary | ICD-10-CM | POA: Diagnosis not present

## 2012-03-31 DIAGNOSIS — J019 Acute sinusitis, unspecified: Secondary | ICD-10-CM | POA: Diagnosis not present

## 2012-04-06 ENCOUNTER — Ambulatory Visit (INDEPENDENT_AMBULATORY_CARE_PROVIDER_SITE_OTHER): Payer: Medicare Other | Admitting: Cardiovascular Disease

## 2012-04-06 ENCOUNTER — Encounter: Payer: Self-pay | Admitting: Cardiovascular Disease

## 2012-04-06 VITALS — BP 167/79 | HR 58 | Ht 73.0 in | Wt 191.4 lb

## 2012-04-06 DIAGNOSIS — R079 Chest pain, unspecified: Secondary | ICD-10-CM

## 2012-04-06 DIAGNOSIS — I10 Essential (primary) hypertension: Secondary | ICD-10-CM | POA: Diagnosis not present

## 2012-04-06 DIAGNOSIS — I2510 Atherosclerotic heart disease of native coronary artery without angina pectoris: Secondary | ICD-10-CM

## 2012-04-06 DIAGNOSIS — I25810 Atherosclerosis of coronary artery bypass graft(s) without angina pectoris: Secondary | ICD-10-CM

## 2012-04-06 DIAGNOSIS — E785 Hyperlipidemia, unspecified: Secondary | ICD-10-CM

## 2012-04-06 NOTE — Assessment & Plan Note (Signed)
See bp response to exercise Home readings ok  If HTN response may add nitrates

## 2012-04-06 NOTE — Assessment & Plan Note (Signed)
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  F/U stress myovue

## 2012-04-06 NOTE — Progress Notes (Signed)
Patient ID: [name] [name], male   DOB: Mar 30, 1941, 71 y.o.   MRN: [id number] Mr. Nesbit is 71 yo previously seen by Dr Juanda Chance. [name] He has documented coronary disease and has chronic occlusion of the right coronary had cutting balloon angioplasty to the diagonal branch of the LAD. This occluded in 2008. F/U cath for abnormal ETT 11/11 showed occluded RCA and D1 with 70% mid LAD that Dr Juanda Chance decided to manage medically. He has been doing fairly well Previous anginal equivalent vague with some dyspnea/  Has slowed down abit and having some exertional dyspea.  Has not had to take nitro  Has not had myovue in ove r5 years  ROS: Denies fever, malais, weight loss, blurry vision, decreased visual acuity, cough, sputum, SOB, hemoptysis, pleuritic pain, palpitaitons, heartburn, abdominal pain, melena, lower extremity edema, claudication, or rash.  All other systems reviewed and negative  General: Affect appropriate Healthy:  appears stated age HEENT: normal Neck supple with no adenopathy JVP normal no bruits no thyromegaly Lungs clear with no wheezing and good diaphragmatic motion Heart:  S1/S2 no murmur, no rub, gallop or click PMI normal Abdomen: benighn, BS positve, no tenderness, no AAA no bruit.  No HSM or HJR Distal pulses intact with no bruits No edema Neuro non-focal Skin warm and dry No muscular weakness   Current Outpatient Prescriptions  Medication Sig Dispense Refill  . amoxicillin-clavulanate (AUGMENTIN) 875-125 MG per tablet Take 1 tablet by mouth 2 (two) times daily.  20 tablet  0  . aspirin 81 MG tablet Take 81 mg by mouth daily.        . Cholecalciferol (VITAMIN D3) 1000 UNITS CAPS Take 1 capsule by mouth daily.        . clopidogrel (PLAVIX) 75 MG tablet TAKE 1 TABLET DAILY  90 tablet  2  . halobetasol (ULTRAVATE) 0.05 % cream USE TWICE A DAY AS NEEDED  1 g  1  . levothyroxine (SYNTHROID, LEVOTHROID) 100 MCG tablet TAKE 1 TABLET DAILY  90 tablet  1  . metoprolol succinate  (TOPROL-XL) 25 MG 24 hr tablet TAKE 1 TABLET DAILY  90 tablet  1  . mometasone (ELOCON) 0.1 % cream Apply topically 2 (two) times daily.        . mometasone (NASONEX) 50 MCG/ACT nasal spray 2 sprays each nostril qd  17 g  12  . Multiple Vitamins-Minerals (CENTRUM SILVER PO) Take 1 tablet by mouth daily.        . niacin-simvastatin (SIMCOR) 1000-20 MG 24 hr tablet Take 2 tablets by mouth at bedtime.  180 tablet  3  . Omega-3 Fatty Acids (FISH OIL) 1200 MG CAPS Take 1 capsule by mouth 2 (two) times daily.        [name] losartan (COZAAR) 100 MG tablet Take 1 tablet (100 mg total) by mouth daily.  90 tablet  3  . nitroGLYCERIN (NITROSTAT) 0.4 MG SL tablet Place 1 tablet (0.4 mg total) under the tongue every 5 (five) minutes as needed for chest pain.  25 tablet  6   Current Facility-Administered Medications  Medication Dose Route Frequency Provider Last Rate Last Dose  . 0.9 %  sodium chloride infusion  500 mL Intravenous Continuous Mardella Layman, MD        Allergies  Review of patient's allergies indicates no known allergies.  Electrocardiogram:  SR rate 59  RBBB LVH    Assessment and Plan

## 2012-04-06 NOTE — Patient Instructions (Addendum)
Your physician wants you to follow-up in: YEAR WITH DR Haywood Filler will receive a reminder letter in the mail two months in advance. If you don't receive a letter, please call our office to schedule the follow-up appointment.  Your physician recommends that you continue on your current medications as directed. Please refer to the Current Medication list given to you today. Your physician has requested that you have en exercise stress myoview. For further information please visit [email or web address] Please follow instruction sheet, as given. DX  CAD  CHEST PAIN

## 2012-04-06 NOTE — Assessment & Plan Note (Signed)
Cholesterol is at goal.  Continue current dose of statin and diet Rx.  No myalgias or side effects.  F/U  LFT's in 6 months. Lab Results  Component Value Date   LDLCALC 64 08/20/2011

## 2012-04-07 DIAGNOSIS — S90129A Contusion of unspecified lesser toe(s) without damage to nail, initial encounter: Secondary | ICD-10-CM | POA: Diagnosis not present

## 2012-04-12 ENCOUNTER — Ambulatory Visit (HOSPITAL_COMMUNITY): Payer: Medicare Other | Attending: Cardiology | Admitting: Radiology

## 2012-04-12 VITALS — BP 151/82 | HR 55 | Ht 73.0 in | Wt 189.0 lb

## 2012-04-12 DIAGNOSIS — I999 Unspecified disorder of circulatory system: Secondary | ICD-10-CM | POA: Insufficient documentation

## 2012-04-12 DIAGNOSIS — I10 Essential (primary) hypertension: Secondary | ICD-10-CM | POA: Diagnosis not present

## 2012-04-12 DIAGNOSIS — I2510 Atherosclerotic heart disease of native coronary artery without angina pectoris: Secondary | ICD-10-CM | POA: Diagnosis not present

## 2012-04-12 DIAGNOSIS — R0989 Other specified symptoms and signs involving the circulatory and respiratory systems: Secondary | ICD-10-CM | POA: Insufficient documentation

## 2012-04-12 DIAGNOSIS — I4510 Unspecified right bundle-branch block: Secondary | ICD-10-CM

## 2012-04-12 DIAGNOSIS — R0602 Shortness of breath: Secondary | ICD-10-CM

## 2012-04-12 DIAGNOSIS — R079 Chest pain, unspecified: Secondary | ICD-10-CM

## 2012-04-12 DIAGNOSIS — I25810 Atherosclerosis of coronary artery bypass graft(s) without angina pectoris: Secondary | ICD-10-CM

## 2012-04-12 DIAGNOSIS — R0609 Other forms of dyspnea: Secondary | ICD-10-CM | POA: Diagnosis not present

## 2012-04-12 MED ORDER — TECHNETIUM TC 99M SESTAMIBI GENERIC - CARDIOLITE
10.0000 | Freq: Once | INTRAVENOUS | Status: AC | PRN
  Administered 2012-04-12: 10 via INTRAVENOUS

## 2012-04-12 MED ORDER — TECHNETIUM TC 99M SESTAMIBI GENERIC - CARDIOLITE
30.0000 | Freq: Once | INTRAVENOUS | Status: AC | PRN
  Administered 2012-04-12: 30 via INTRAVENOUS

## 2012-04-12 NOTE — Progress Notes (Signed)
[facility] SITE 3 NUCLEAR MED [address] [place], [place] [address] [phone]    Cardiology Nuclear Med Study  [name] [name] is a 71 y.o. male     MRN : [id number]     DOB: Dec 05, 1940  Procedure Date: 04/12/2012  Nuclear Med Background Indication for Stress Test:  Evaluation for Ischemia and PTCA Patency History:  '07 OZH:[id number] ischemia>PTCA; '11 GXT:(+)>Cath:Occ. RCA/Diag, LAD 70%, EF=60%, medical tx Cardiac Risk Factors: History of Smoking, Hypertension and Lipids  Symptoms:  DOE   Nuclear Pre-Procedure Caffeine/Decaff Intake:  None > 12 hrs NPO After: 6:00pm   Lungs:  Clear. O2 Sat: 99% on room air. IV 0.9% NS with Angio Cath:  22g  IV Site: R Wrist x 1, tolerated well IV Started by:  Irean Hong, RN  Chest Size (in):  44 Cup Size: n/a  Height: 6\' 1"  (1.854 m)  Weight:  189 lb (85.73 kg)  BMI:  Body mass index is 24.94 kg/(m^2). Tech Comments:  Held Toprol x 24 hrs    Nuclear Med Study 1 or 2 day study: 1 day  Stress Test Type:  Stress  Reading MD: Willa Rough, MD  Order Authorizing Provider:  Charlton Haws, MD  Resting Radionuclide: Technetium 41m Sestamibi  Resting Radionuclide Dose: 11.0 mCi   Stress Radionuclide:  Technetium 75m Sestamibi  Stress Radionuclide Dose: 33.0 mCi           Stress Protocol Rest HR: 55 Stress HR: 142  Rest BP: 151/82 Stress BP: 206/83  Exercise Time (min): 11:00 METS: 13.4   Predicted Max HR: 149 bpm % Max HR: 95.3 bpm Rate Pressure Product: 13244    Dose of Adenosine (mg):  n/a Dose of Lexiscan: n/a mg  Dose of Atropine (mg): n/a Dose of Dobutamine: n/a mcg/kg/min (at max HR)  Stress Test Technologist: Smiley Houseman, CMA-N  Nuclear Technologist:  Domenic Polite, CNMT     Rest Procedure:  Myocardial perfusion imaging was performed at rest 45 minutes following the intravenous administration of Technetium 73m Sestamibi.  Rest ECG: Sinus bradycardia with right bundle branch block  Stress  Procedure:  The patient exercised on the treadmill utilizing the [name] Protocol for eleven minutes. He then stopped due to fatigue and denied any chest pain.  Technetium 69m Sestamibi was injected at peak exercise and myocardial perfusion imaging was performed after a brief delay.  Stress ECG: No significant ST segment change suggestive of ischemia.  QPS Raw Data Images:  Patient motion noted; appropriate software correction applied. Stress Images:  There is a small area with moderate photon reduction affecting the base and mid inferior wall. Rest Images:  Very slight photon reduction the base of the inferior wall Subtraction (SDS):  Mild ischemia at the base of the inferior wall Transient Ischemic Dilatation (Normal <1.22):  0.98 Lung/Heart Ratio (Normal <0.45):  0.36  Quantitative Gated Spect Images QGS EDV:  89 ml QGS ESV:  27 ml  Impression Exercise Capacity:  Good exercise capacity. BP Response:  Hypertensive blood pressure response. Clinical Symptoms:  No significant symptoms noted. ECG Impression:  No significant ST segment change suggestive of ischemia. Comparison with Prior Nuclear Study: No images to compare  Overall Impression:  There is mild ischemia at the base and mid inferior wall. There is normal wall motion. This is a low risk scan.  LV Ejection Fraction: 69%.  LV Wall Motion:  Normal Wall Motion.   Willa Rough, MD

## 2012-06-10 ENCOUNTER — Other Ambulatory Visit: Payer: Self-pay

## 2012-06-21 ENCOUNTER — Other Ambulatory Visit: Payer: Self-pay

## 2012-06-21 MED ORDER — MOMETASONE FUROATE 0.1 % EX CREA: TOPICAL_CREAM | Freq: Two times a day (BID) | CUTANEOUS | Status: DC

## 2012-06-21 MED ORDER — HALOBETASOL PROPIONATE 0.05 % EX CREA: TOPICAL_CREAM | CUTANEOUS | Status: DC

## 2012-06-29 ENCOUNTER — Other Ambulatory Visit: Payer: Self-pay

## 2012-06-29 MED ORDER — CLOPIDOGREL BISULFATE 75 MG PO TABS: ORAL_TABLET | ORAL | Status: DC

## 2012-08-29 ENCOUNTER — Encounter: Payer: Self-pay | Admitting: Internal Medicine

## 2012-08-29 ENCOUNTER — Ambulatory Visit (INDEPENDENT_AMBULATORY_CARE_PROVIDER_SITE_OTHER): Payer: Medicare Other | Admitting: Internal Medicine

## 2012-08-29 ENCOUNTER — Other Ambulatory Visit (INDEPENDENT_AMBULATORY_CARE_PROVIDER_SITE_OTHER): Payer: Medicare Other

## 2012-08-29 VITALS — BP 148/72 | HR 51 | Temp 97.5°F | Ht 73.0 in | Wt 194.8 lb

## 2012-08-29 DIAGNOSIS — N320 Bladder-neck obstruction: Secondary | ICD-10-CM | POA: Diagnosis not present

## 2012-08-29 DIAGNOSIS — Z0000 Encounter for general adult medical examination without abnormal findings: Secondary | ICD-10-CM | POA: Diagnosis not present

## 2012-08-29 DIAGNOSIS — I2510 Atherosclerotic heart disease of native coronary artery without angina pectoris: Secondary | ICD-10-CM | POA: Diagnosis not present

## 2012-08-29 DIAGNOSIS — I10 Essential (primary) hypertension: Secondary | ICD-10-CM

## 2012-08-29 LAB — CBC WITH DIFFERENTIAL/PLATELET
Eosinophils Absolute: 0.2 10*3/uL (ref 0.0–0.7)
MCHC: 34 g/dL (ref 30.0–36.0)
MCV: 90.3 fl (ref 78.0–100.0)
Monocytes Absolute: 0.7 10*3/uL (ref 0.1–1.0)
Neutrophils Relative %: 55.2 % (ref 43.0–77.0)
Platelets: 188 10*3/uL (ref 150.0–400.0)
RDW: 13.9 % (ref 11.5–14.6)

## 2012-08-29 LAB — HEPATIC FUNCTION PANEL
ALT: 36 U/L (ref 0–53)
AST: 38 U/L — ABNORMAL HIGH (ref 0–37)
Bilirubin, Direct: 0.2 mg/dL (ref 0.0–0.3)
Total Protein: 7 g/dL (ref 6.0–8.3)

## 2012-08-29 LAB — LIPID PANEL
Cholesterol: 121 mg/dL (ref 0–200)
HDL: 55.4 mg/dL (ref >39.00)
VLDL: 11.6 mg/dL (ref 0.0–40.0)

## 2012-08-29 LAB — URINALYSIS
Bilirubin Urine: NEGATIVE
Hgb urine dipstick: NEGATIVE
Ketones, ur: NEGATIVE
Nitrite: NEGATIVE

## 2012-08-29 LAB — BASIC METABOLIC PANEL
BUN: 13 mg/dL (ref 6–23)
GFR: 81.97 mL/min (ref >60.00)
Potassium: 4.6 mEq/L (ref 3.5–5.1)
Sodium: 138 mEq/L (ref 135–145)

## 2012-08-29 MED ORDER — LOSARTAN POTASSIUM 100 MG PO TABS: 100.0000 mg | ORAL_TABLET | Freq: Every day | ORAL | Status: DC

## 2012-08-29 NOTE — Assessment & Plan Note (Signed)
Continue with current prescription therapy as reflected on the Med list.  

## 2012-08-29 NOTE — Patient Instructions (Signed)
BP Readings from Last 3 Encounters:  08/29/12 148/72  04/12/12 151/82  04/06/12 167/79

## 2012-08-29 NOTE — Progress Notes (Signed)
Subjective:    HPI  The patient is here for a wellness exam. The patient has been doing well overall without major physical or psychological issues going on lately except for SBP>140. He lost wt  The patient presents for a follow-up of  chronic hypertension, chronic dyslipidemia, CAD controlled with medicines  BP Readings from Last 3 Encounters:  08/29/12 148/72  04/12/12 151/82  04/06/12 167/79   Wt Readings from Last 3 Encounters:  08/29/12 194 lb 12.8 oz (88.361 kg)  04/12/12 189 lb (85.73 kg)  04/06/12 191 lb 6.4 oz (86.818 kg)      Review of Systems  Constitutional: Negative for appetite change, fatigue and unexpected weight change.  HENT: Negative for nosebleeds, congestion, sore throat, sneezing, trouble swallowing and neck pain.   Eyes: Negative for itching and visual disturbance.  Respiratory: Negative for cough.   Cardiovascular: Negative for chest pain, palpitations and leg swelling.  Gastrointestinal: Negative for nausea, diarrhea, blood in stool and abdominal distention.  Genitourinary: Negative for frequency and hematuria.  Musculoskeletal: Negative for back pain, joint swelling and gait problem.  Skin: Negative for rash.  Neurological: Negative for dizziness, tremors, speech difficulty and weakness.  Psychiatric/Behavioral: Negative for sleep disturbance, dysphoric mood and agitation. The patient is not nervous/anxious.        Objective:   Physical Exam  Constitutional: He is oriented to person, place, and time. He appears well-developed and well-nourished. No distress.  HENT:  Head: Normocephalic and atraumatic.  Right Ear: External ear normal.  Left Ear: External ear normal.  Nose: Nose normal.  Mouth/Throat: Oropharynx is clear and moist. No oropharyngeal exudate.  Eyes: Conjunctivae and EOM are normal. Pupils are equal, round, and reactive to light. Right eye exhibits no discharge. Left eye exhibits no discharge. No scleral icterus.  Neck: Normal  range of motion. Neck supple. No JVD present. No tracheal deviation present. No thyromegaly present.  Cardiovascular: Normal rate, regular rhythm, normal heart sounds and intact distal pulses.  Exam reveals no gallop and no friction rub.   No murmur heard. Pulmonary/Chest: Effort normal and breath sounds normal. No stridor. No respiratory distress. He has no wheezes. He has no rales. He exhibits no tenderness.  Abdominal: Soft. Bowel sounds are normal. He exhibits no distension and no mass. There is no tenderness. There is no rebound and no guarding.  Genitourinary: Rectum normal, prostate normal and penis normal. Guaiac negative stool. No penile tenderness.  Musculoskeletal: Normal range of motion. He exhibits no edema and no tenderness.  Lymphadenopathy:    He has no cervical adenopathy.  Neurological: He is alert and oriented to person, place, and time. He has normal reflexes. No cranial nerve deficit. He exhibits normal muscle tone. Coordination normal.  Skin: Skin is warm and dry. No rash noted. He is not diaphoretic. No erythema. No pallor.  Psychiatric: He has a normal mood and affect. His behavior is normal. Judgment and thought content normal.   L big toe with fungal type changes - seems to be coming off  Lab Results  Component Value Date   WBC 9.3 03/23/2012   HGB 15.1 03/23/2012   HCT 47.7 03/23/2012   PLT 202.0 08/20/2011   GLUCOSE 93 08/20/2011   CHOL 122 08/20/2011   TRIG 88.0 08/20/2011   HDL 40.90 08/20/2011   LDLCALC 64 08/20/2011   ALT 32 08/20/2011   AST 32 08/20/2011   NA 142 08/20/2011   K 4.8 08/20/2011   CL 105 08/20/2011   CREATININE 0.8 08/20/2011  BUN 11 08/20/2011   CO2 29 08/20/2011   TSH 3.32 08/20/2011   PSA 0.67 08/20/2011   INR 1.0 ratio 03/06/2010   HGBA1C 6.0 08/20/2011         Assessment & Plan:

## 2012-08-29 NOTE — Assessment & Plan Note (Signed)

## 2012-08-29 NOTE — Assessment & Plan Note (Signed)
Start Losartan Lost wt

## 2012-08-31 DIAGNOSIS — L02619 Cutaneous abscess of unspecified foot: Secondary | ICD-10-CM | POA: Diagnosis not present

## 2012-08-31 DIAGNOSIS — S90129A Contusion of unspecified lesser toe(s) without damage to nail, initial encounter: Secondary | ICD-10-CM | POA: Diagnosis not present

## 2012-08-31 DIAGNOSIS — L03039 Cellulitis of unspecified toe: Secondary | ICD-10-CM | POA: Diagnosis not present

## 2012-09-04 ENCOUNTER — Other Ambulatory Visit: Payer: Self-pay | Admitting: Internal Medicine

## 2012-09-12 DIAGNOSIS — S90129A Contusion of unspecified lesser toe(s) without damage to nail, initial encounter: Secondary | ICD-10-CM | POA: Diagnosis not present

## 2012-09-13 ENCOUNTER — Encounter: Payer: Self-pay | Admitting: Internal Medicine

## 2012-09-13 ENCOUNTER — Ambulatory Visit (INDEPENDENT_AMBULATORY_CARE_PROVIDER_SITE_OTHER): Payer: Medicare Other | Admitting: Internal Medicine

## 2012-09-13 VITALS — BP 142/70 | HR 64 | Temp 97.8°F | Ht 73.0 in | Wt 197.0 lb

## 2012-09-13 DIAGNOSIS — J019 Acute sinusitis, unspecified: Secondary | ICD-10-CM | POA: Diagnosis not present

## 2012-09-13 MED ORDER — AMOXICILLIN-POT CLAVULANATE 875-125 MG PO TABS: 1.0000 | ORAL_TABLET | Freq: Two times a day (BID) | ORAL | Status: DC

## 2012-09-13 NOTE — Patient Instructions (Signed)

## 2012-09-13 NOTE — Progress Notes (Signed)
HPI  Pt presents to the clinic today with 5 day history of fatigue, headache, nasal congestion and sore throat. He has taken Afrin, Mucinex and Benadryl. He has been blowing green mucous out of his nose. His symptoms are getting worse. He feels like he his been ran over. He does have a history of allergies but not asthma. He does not smoke. He has not had sick contacts.  Review of Systems    Past Medical History  Diagnosis Date  . CAD (coronary artery disease)   . MI (myocardial infarction)   . Hyperlipemia   . Hypothyroidism   . Allergic rhinitis   . OSA (obstructive sleep apnea)   . Psoriasis   . Family hx of colon cancer     brother  . Hypertension     Family History  Problem Relation Age of Onset  . Hypertension    . Colon cancer    . Colon cancer Brother     died 60  . Cancer Brother     bone cancer  . Coronary artery disease Mother   . Heart disease Mother   . Colon polyps Sister     x2  . COPD Father   . Colon cancer Maternal Aunt   . Colon cancer Maternal Uncle   . Colon cancer Maternal Aunt   . Colon cancer Paternal Aunt   . Colon cancer Paternal Grandfather   . Cancer Brother     colon cancer    History   Social History  . Marital Status: Married    Spouse Name: N/A    Number of Children: N/A  . Years of Education: N/A   Occupational History  . 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    Social History Main Topics  . Smoking status: Former Games developer  . Smokeless tobacco: Never Used  . Alcohol Use: 3.6 oz/week    6 Cans of beer per week  . Drug Use: No  . Sexually Active: Not Currently   Other Topics Concern  . Not on file   Social History Narrative  . No narrative on file    No Known Allergies   Constitutional: Positive headache, fatigue and fever. Denies abrupt weight changes.  HEENT:  Positive eye pain, pressure behind the eyes, facial pain, nasal congestion and sore throat. Denies eye redness, ear pain, ringing in the ears, wax buildup,  runny nose or bloody nose. Respiratory: Positive cough and thick green sputum production. Denies difficulty breathing or shortness of breath.  Cardiovascular: Denies chest pain, chest tightness, palpitations or swelling in the hands or feet.   No other specific complaints in a complete review of systems (except as listed in HPI above).  Objective:    BP 142/70  Pulse 64  Temp(Src) 97.8 F (36.6 C) (Oral)  Ht 6\' 1"  (1.854 m)  Wt 197 lb (89.359 kg)  BMI 26 kg/m2  SpO2 97% Wt Readings from Last 3 Encounters:  09/13/12 197 lb (89.359 kg)  08/29/12 194 lb 12.8 oz (88.361 kg)  04/12/12 189 lb (85.73 kg)    General: Appears his stated age, well developed, well nourished in NAD. HEENT: Head: normal shape and size; Eyes: sclera white, no icterus, conjunctiva pink, PERRLA and EOMs intact; Ears: Tm's gray and intact, normal light reflex; Nose: mucosa pink and moist, septum midline; Throat/Mouth: + PND. Teeth present, mucosa pink and moist, no exudate noted, no lesions or ulcerations noted.  Neck: Mild cervical lymphadenopathy. Neck supple, trachea midline. No  massses, lumps or thyromegaly present.  Cardiovascular: Normal rate and rhythm. S1,S2 noted.  No murmur, rubs or gallops noted. No JVD or BLE edema. No carotid bruits noted. Pulmonary/Chest: Normal effort and positive vesicular breath sounds. No respiratory distress. No wheezes, rales or ronchi noted.      Assessment & Plan:   Acute bacterial sinusitis  Can use a Neti Pot which can be purchased from your local drug store. Flonase 2 sprays each nostril for 3 days and then as needed. Augmentin BID for 10 days  RTC as needed or if symptoms persist.

## 2013-01-30 DIAGNOSIS — H31099 Other chorioretinal scars, unspecified eye: Secondary | ICD-10-CM | POA: Diagnosis not present

## 2013-01-30 DIAGNOSIS — H2589 Other age-related cataract: Secondary | ICD-10-CM | POA: Diagnosis not present

## 2013-01-30 DIAGNOSIS — H01029 Squamous blepharitis unspecified eye, unspecified eyelid: Secondary | ICD-10-CM | POA: Diagnosis not present

## 2013-01-30 DIAGNOSIS — H02839 Dermatochalasis of unspecified eye, unspecified eyelid: Secondary | ICD-10-CM | POA: Diagnosis not present

## 2013-03-01 ENCOUNTER — Other Ambulatory Visit: Payer: Self-pay

## 2013-03-03 ENCOUNTER — Other Ambulatory Visit: Payer: Self-pay | Admitting: Cardiovascular Disease

## 2013-04-12 ENCOUNTER — Ambulatory Visit (INDEPENDENT_AMBULATORY_CARE_PROVIDER_SITE_OTHER): Payer: Medicare Other | Admitting: Cardiovascular Disease

## 2013-04-12 VITALS — HR 102 | Ht 73.0 in | Wt 204.4 lb

## 2013-04-12 DIAGNOSIS — I2510 Atherosclerotic heart disease of native coronary artery without angina pectoris: Secondary | ICD-10-CM

## 2013-04-12 DIAGNOSIS — R0609 Other forms of dyspnea: Secondary | ICD-10-CM | POA: Diagnosis not present

## 2013-04-12 DIAGNOSIS — R0600 Dyspnea, unspecified: Secondary | ICD-10-CM

## 2013-04-12 NOTE — Assessment & Plan Note (Signed)
STable mild exertional dyspnea Low risk scan last year that probably reflected collaterals to RCA  Concerned about progression of LAD disease F/U ETT

## 2013-04-12 NOTE — Assessment & Plan Note (Signed)
Start taking losartan Low sodium diet

## 2013-04-12 NOTE — Progress Notes (Signed)
Patient ID: [name] [name], male   DOB: 06/17/1940, 72 y.o.   MRN: [id number] Mr. Smolinsky is 72 yo previously seen by Dr Juanda Chance. [name] He has documented coronary disease and has chronic occlusion of the right coronary had cutting balloon angioplasty to the diagonal branch of the LAD. This occluded in 2008. F/U cath for abnormal ETT 11/11 showed occluded RCA and D1 with 70% mid LAD that Dr Juanda Chance decided to manage medically.  He has been doing fairly well Previous anginal equivalent vague with some dyspnea/ Has slowed down abit and having some exertional dyspea. Has not had to take nitro   Myovue 04/12/12 low risk Overall Impression: There is mild ischemia at the base and mid inferior wall. There is normal wall motion. This is a low risk scan.  LV Ejection Fraction: 69%. LV Wall Motion: Normal Wall Motion.   Mild exertional dyspnea BP seems up but he is not taking BP pill that Dr Paulette Blanch suggested      ROS: Denies fever, malais, weight loss, blurry vision, decreased visual acuity, cough, sputum, SOB, hemoptysis, pleuritic pain, palpitaitons, heartburn, abdominal pain, melena, lower extremity edema, claudication, or rash.  All other systems reviewed and negative  General: Affect appropriate Healthy:  appears stated age HEENT: normal Neck supple with no adenopathy JVP normal no bruits no thyromegaly Lungs clear with no wheezing and good diaphragmatic motion Heart:  S1/S2 no murmur, no rub, gallop or click PMI normal Abdomen: benighn, BS positve, no tenderness, no AAA no bruit.  No HSM or HJR Distal pulses intact with no bruits No edema Neuro non-focal Skin warm and dry No muscular weakness   Current Outpatient Prescriptions  Medication Sig Dispense Refill  . amoxicillin-clavulanate (AUGMENTIN) 875-125 MG per tablet Take 1 tablet by mouth 2 (two) times daily.  20 tablet  0  . aspirin 81 MG tablet Take 81 mg by mouth daily.        . Cholecalciferol (VITAMIN D3) 1000 UNITS CAPS Take 1  capsule by mouth daily.        . clopidogrel (PLAVIX) 75 MG tablet TAKE 1 TABLET DAILY  90 tablet  3  . halobetasol (ULTRAVATE) 0.05 % cream USE TWICE A DAY AS NEEDED  15 g  1  . levothyroxine (SYNTHROID, LEVOTHROID) 100 MCG tablet TAKE 1 TABLET DAILY  90 tablet  2  . losartan (COZAAR) 100 MG tablet Take 1 tablet (100 mg total) by mouth daily.  90 tablet  3  . metoprolol succinate (TOPROL-XL) 25 MG 24 hr tablet TAKE 1 TABLET DAILY  90 tablet  2  . mometasone (ELOCON) 0.1 % cream Apply topically 2 (two) times daily.  45 g  1  . mometasone (NASONEX) 50 MCG/ACT nasal spray 2 sprays each nostril qd  17 g  12  . Multiple Vitamins-Minerals (CENTRUM SILVER PO) Take 1 tablet by mouth daily.        . nitroGLYCERIN (NITROSTAT) 0.4 MG SL tablet Place 1 tablet (0.4 mg total) under the tongue every 5 (five) minutes as needed for chest pain.  25 tablet  6  . Omega-3 Fatty Acids (FISH OIL) 1200 MG CAPS Take 1 capsule by mouth 2 (two) times daily.        [name] SIMCOR 1000-20 MG 24 hr tablet TAKE 2 TABLETS AT BEDTIME  180 tablet  1   Current Facility-Administered Medications  Medication Dose Route Frequency Provider Last Rate Last Dose  . 0.9 %  sodium chloride infusion  500 mL Intravenous Continuous  Mardella Layman, MD        Allergies  Review of patient's allergies indicates no known allergies.  Electrocardiogram:  04/06/12 SR rate 58  LAD RBBB   Today SR rate 100 RBBB LAFB  No change from 2013 except rate up   Assessment and Plan

## 2013-04-12 NOTE — Patient Instructions (Signed)
Your physician wants you to follow-up in:   6 MONTHS WITH DR Haywood Filler will receive a reminder letter in the mail two months in advance. If you don't receive a letter, please call our office to schedule the follow-up appointment. Your physician recommends that you continue on your current medications as directed. Please refer to the Current Medication list given to you today. START  MEDICINE  DR Carlton Adam  Your physician has requested that you have an exercise tolerance test. For further information please visit [email or web address] Please also follow instruction sheet, as given.

## 2013-05-17 ENCOUNTER — Ambulatory Visit (INDEPENDENT_AMBULATORY_CARE_PROVIDER_SITE_OTHER): Payer: Medicare Other | Admitting: Physician Assistant

## 2013-05-17 DIAGNOSIS — I2510 Atherosclerotic heart disease of native coronary artery without angina pectoris: Secondary | ICD-10-CM | POA: Diagnosis not present

## 2013-05-17 DIAGNOSIS — R0609 Other forms of dyspnea: Secondary | ICD-10-CM

## 2013-05-17 DIAGNOSIS — R0989 Other specified symptoms and signs involving the circulatory and respiratory systems: Secondary | ICD-10-CM

## 2013-05-17 DIAGNOSIS — R0600 Dyspnea, unspecified: Secondary | ICD-10-CM

## 2013-05-17 NOTE — Patient Instructions (Signed)
YOU HAVE A FOLLOW UP WITH DR. Johnsie Cancel 05/23/13 @ 3:30

## 2013-05-17 NOTE — Progress Notes (Signed)
Exercise Treadmill Test  Pre-Exercise Testing Evaluation Rhythm: normal sinus  Rate: 70 bpm     Test  Exercise Tolerance Test Ordering MD: Jenkins Rouge, MD  Interpreting MD: Richardson Dopp, PA-C  Unique Test No: 1  Treadmill:  1  Indication for ETT: known ASHD/sob  Contraindication to ETT: No   Stress Modality: exercise - treadmill  Cardiac Imaging Performed: non   Protocol: standard [name] - maximal  Max BP:  200/70  Max MPHR (bpm):  148 85% MPR (bpm):  126  MPHR obtained (bpm):  144 % MPHR obtained:  97  Reached 85% MPHR (min:sec):  9:05 Total Exercise Time (min-sec):  10:00  Workload in METS:  11.7 Borg Scale: 16  Reason ETT Terminated:  technical difficulties monitoring (EKG or BP)    ST Segment Analysis At Rest: non-specific ST segment slurring With Exercise: significant ischemic ST depression  Other Information Arrhythmia:  No Angina during ETT:  absent (0) Quality of ETT:  diagnostic  ETT Interpretation:  abnormal - evidence of ST depression consistent with ischemia  Comments: Good exercise capacity. No chest pain. Normal BP response to exercise. There was significant anterolateral ST depression at peak exercise. No arrhythmia.    Recommendations: Reviewed ECGs with Dr. Jenkins Rouge. Discussed with patient. Will have him follow up with Dr. Jenkins Rouge next available to discuss results and +/- cardiac cath. Signed, Richardson Dopp, PA-C   05/17/2013 9:56 AM

## 2013-05-23 ENCOUNTER — Ambulatory Visit (INDEPENDENT_AMBULATORY_CARE_PROVIDER_SITE_OTHER): Payer: Medicare Other | Admitting: Cardiovascular Disease

## 2013-05-23 ENCOUNTER — Encounter: Payer: Self-pay | Admitting: Cardiovascular Disease

## 2013-05-23 VITALS — BP 150/70 | HR 60 | Ht 73.0 in | Wt 207.4 lb

## 2013-05-23 DIAGNOSIS — E785 Hyperlipidemia, unspecified: Secondary | ICD-10-CM | POA: Diagnosis not present

## 2013-05-23 DIAGNOSIS — I10 Essential (primary) hypertension: Secondary | ICD-10-CM

## 2013-05-23 DIAGNOSIS — I2510 Atherosclerotic heart disease of native coronary artery without angina pectoris: Secondary | ICD-10-CM | POA: Diagnosis not present

## 2013-05-23 NOTE — Assessment & Plan Note (Signed)
Well controlled.  Continue current medications and low sodium Dash type diet.    

## 2013-05-23 NOTE — Patient Instructions (Signed)
Your physician wants you to follow-up in:  6 MONTHS WITH DR NISHAN  You will receive a reminder letter in the mail two months in advance. If you don't receive a letter, please call our office to schedule the follow-up appointment. Your physician recommends that you continue on your current medications as directed. Please refer to the Current Medication list given to you today. 

## 2013-05-23 NOTE — Progress Notes (Signed)
Patient ID: [name] [name], male   DOB: 1941/03/26, 72 y.o.   MRN: [id number] Mr. Chartrand is 73 yo previously seen by Dr Olevia Perches. [name] He has documented coronary disease and has chronic occlusion of the right coronary had cutting balloon angioplasty to the diagonal branch of the LAD. This occluded in 2008. F/U cath for abnormal ETT 11/11 showed occluded RCA and D1 with 70% mid LAD that Dr Olevia Perches decided to manage medically.  He has been doing fairly well   Myovue 04/12/12 low risk  Overall Impression: There is mild ischemia at the base and mid inferior wall. There is normal wall motion. This is a low risk scan.  LV Ejection Fraction: 69%. LV Wall Motion: Normal Wall Motion.  Mild exertional dyspnea BP seems up but he is not taking BP pill that Dr Laurian Brim suggested   ETT 05/20/13 with 10 mintues exercise and positive ECG However he has no angina and had specific symptoms of throat tightness and bad indigestion Before all his interventions.  He does not feel he needs a cath " I know my body"   ROS: Denies fever, malais, weight loss, blurry vision, decreased visual acuity, cough, sputum, SOB, hemoptysis, pleuritic pain, palpitaitons, heartburn, abdominal pain, melena, lower extremity edema, claudication, or rash.  All other systems reviewed and negative  General: Affect appropriate Healthy:  appears stated age 47: normal Neck supple with no adenopathy JVP normal no bruits no thyromegaly Lungs clear with no wheezing and good diaphragmatic motion Heart:  S1/S2 no murmur, no rub, gallop or click PMI normal Abdomen: benighn, BS positve, no tenderness, no AAA no bruit.  No HSM or HJR Distal pulses intact with no bruits No edema Neuro non-focal Skin warm and dry No muscular weakness   Current Outpatient Prescriptions  Medication Sig Dispense Refill  . aspirin 81 MG tablet Take 81 mg by mouth daily.        . clopidogrel (PLAVIX) 75 MG tablet TAKE 1 TABLET DAILY  90 tablet  3  . halobetasol  (ULTRAVATE) 0.05 % cream USE TWICE A DAY AS NEEDED  15 g  1  . levothyroxine (SYNTHROID, LEVOTHROID) 100 MCG tablet TAKE 1 TABLET DAILY  90 tablet  2  . losartan (COZAAR) 100 MG tablet Take 1 tablet (100 mg total) by mouth daily.  90 tablet  3  . metoprolol succinate (TOPROL-XL) 25 MG 24 hr tablet TAKE 1 TABLET DAILY  90 tablet  2  . mometasone (ELOCON) 0.1 % cream Apply topically 2 (two) times daily.  45 g  1  . mometasone (NASONEX) 50 MCG/ACT nasal spray 2 sprays each nostril qd  17 g  12  . Multiple Vitamins-Minerals (CENTRUM SILVER PO) Take 1 tablet by mouth daily.        . Omega-3 Fatty Acids (FISH OIL) 1200 MG CAPS Take 1 capsule by mouth 2 (two) times daily.        [name] SIMCOR 1000-20 MG 24 hr tablet TAKE 2 TABLETS AT BEDTIME  180 tablet  1  . nitroGLYCERIN (NITROSTAT) 0.4 MG SL tablet Place 1 tablet (0.4 mg total) under the tongue every 5 (five) minutes as needed for chest pain.  25 tablet  6   No current facility-administered medications for this visit.    Allergies  Review of patient's allergies indicates no known allergies.  Electrocardiogram:  SR RBBB LAFB   Assessment and Plan

## 2013-05-23 NOTE — Assessment & Plan Note (Signed)
Cholesterol is at goal.  Continue current dose of statin and diet Rx.  No myalgias or side effects.  F/U  LFT's in 6 months. Lab Results  Component Value Date   LDLCALC 54 08/29/2012            g

## 2013-05-23 NOTE — Assessment & Plan Note (Signed)
Active with no angina  Good MET level and only mildly positive ETT.  Continue medical Rx Patient preference also to not cath at this time

## 2013-06-12 ENCOUNTER — Other Ambulatory Visit: Payer: Self-pay | Admitting: Internal Medicine

## 2013-08-07 ENCOUNTER — Other Ambulatory Visit: Payer: Self-pay | Admitting: Cardiovascular Disease

## 2013-08-28 ENCOUNTER — Other Ambulatory Visit (INDEPENDENT_AMBULATORY_CARE_PROVIDER_SITE_OTHER): Payer: Medicare Other

## 2013-08-28 ENCOUNTER — Ambulatory Visit (INDEPENDENT_AMBULATORY_CARE_PROVIDER_SITE_OTHER): Payer: Medicare Other | Admitting: Internal Medicine

## 2013-08-28 ENCOUNTER — Encounter: Payer: Self-pay | Admitting: Internal Medicine

## 2013-08-28 VITALS — BP 140/90 | HR 59 | Temp 97.0°F | Resp 16 | Ht 73.0 in | Wt 209.8 lb

## 2013-08-28 DIAGNOSIS — R7309 Other abnormal glucose: Secondary | ICD-10-CM | POA: Diagnosis not present

## 2013-08-28 DIAGNOSIS — I10 Essential (primary) hypertension: Secondary | ICD-10-CM

## 2013-08-28 DIAGNOSIS — N320 Bladder-neck obstruction: Secondary | ICD-10-CM

## 2013-08-28 DIAGNOSIS — Z23 Encounter for immunization: Secondary | ICD-10-CM

## 2013-08-28 DIAGNOSIS — H6120 Impacted cerumen, unspecified ear: Secondary | ICD-10-CM

## 2013-08-28 DIAGNOSIS — Z0000 Encounter for general adult medical examination without abnormal findings: Secondary | ICD-10-CM

## 2013-08-28 DIAGNOSIS — E039 Hypothyroidism, unspecified: Secondary | ICD-10-CM

## 2013-08-28 DIAGNOSIS — I2510 Atherosclerotic heart disease of native coronary artery without angina pectoris: Secondary | ICD-10-CM

## 2013-08-28 LAB — URINALYSIS
BILIRUBIN URINE: NEGATIVE
Hgb urine dipstick: NEGATIVE
KETONES UR: NEGATIVE
Leukocytes, UA: NEGATIVE
Nitrite: NEGATIVE
SPECIFIC GRAVITY, URINE: 1.015 (ref 1.000–1.030)
Total Protein, Urine: NEGATIVE
URINE GLUCOSE: NEGATIVE
UROBILINOGEN UA: 0.2 (ref 0.0–1.0)
pH: 7 (ref 5.0–8.0)

## 2013-08-28 LAB — HEPATIC FUNCTION PANEL
ALT: 38 U/L (ref 0–53)
AST: 36 U/L (ref 0–37)
Albumin: 4.5 g/dL (ref 3.5–5.2)
Alkaline Phosphatase: 38 U/L — ABNORMAL LOW (ref 39–117)
BILIRUBIN TOTAL: 0.9 mg/dL (ref 0.2–1.2)
Bilirubin, Direct: 0.2 mg/dL (ref 0.0–0.3)
Total Protein: 7.4 g/dL (ref 6.0–8.3)

## 2013-08-28 LAB — CBC WITH DIFFERENTIAL/PLATELET
BASOS ABS: 0 10*3/uL (ref 0.0–0.1)
BASOS PCT: 0.5 % (ref 0.0–3.0)
EOS ABS: 0.5 10*3/uL (ref 0.0–0.7)
Eosinophils Relative: 7.1 % — ABNORMAL HIGH (ref 0.0–5.0)
HEMATOCRIT: 45.4 % (ref 39.0–52.0)
HEMOGLOBIN: 15.4 g/dL (ref 13.0–17.0)
LYMPHS ABS: 2.3 10*3/uL (ref 0.7–4.0)
LYMPHS PCT: 36.7 % (ref 12.0–46.0)
MCHC: 34 g/dL (ref 30.0–36.0)
MCV: 91 fl (ref 78.0–100.0)
MONO ABS: 0.6 10*3/uL (ref 0.1–1.0)
Monocytes Relative: 9.1 % (ref 3.0–12.0)
Neutro Abs: 3.1 10*3/uL (ref 1.4–7.7)
Neutrophils Relative %: 47.8 % (ref 43.0–77.0)
Platelets: 239 10*3/uL (ref 150.0–400.0)
RBC: 4.99 Mil/uL (ref 4.22–5.81)
RDW: 13.3 % (ref 11.5–14.6)
WBC: 6.6 10*3/uL (ref 4.5–10.5)

## 2013-08-28 LAB — LIPID PANEL
Cholesterol: 147 mg/dL (ref 0–200)
HDL: 51.7 mg/dL (ref >39.00)
LDL Cholesterol: 74 mg/dL (ref 0–99)
TRIGLYCERIDES: 108 mg/dL (ref 0.0–149.0)
Total CHOL/HDL Ratio: 3
VLDL: 21.6 mg/dL (ref 0.0–40.0)

## 2013-08-28 LAB — HEMOGLOBIN A1C: HEMOGLOBIN A1C: 5.8 % (ref 4.6–6.5)

## 2013-08-28 LAB — BASIC METABOLIC PANEL
BUN: 13 mg/dL (ref 6–23)
CALCIUM: 9.5 mg/dL (ref 8.4–10.5)
CHLORIDE: 104 meq/L (ref 96–112)
CO2: 29 meq/L (ref 19–32)
Creatinine, Ser: 0.9 mg/dL (ref 0.4–1.5)
GFR: 85.86 mL/min (ref >60.00)
GLUCOSE: 89 mg/dL (ref 70–99)
Potassium: 4 mEq/L (ref 3.5–5.1)
SODIUM: 140 meq/L (ref 135–145)

## 2013-08-28 LAB — TSH: TSH: 3.13 u[IU]/mL (ref 0.35–4.50)

## 2013-08-28 LAB — PSA: PSA: 0.56 ng/mL (ref 0.10–4.00)

## 2013-08-28 NOTE — Assessment & Plan Note (Signed)
Continue with current prescription therapy as reflected on the Med list.  

## 2013-08-28 NOTE — Assessment & Plan Note (Signed)
Wt loss A1c 

## 2013-08-28 NOTE — Progress Notes (Signed)
Subjective:    HPI  The patient is here for a wellness exam. The patient has been doing well overall without major physical or psychological issues going on lately except for SBP=140.  C/o R LBP  The patient presents for a follow-up of  chronic hypertension, chronic dyslipidemia, CAD controlled with medicines  BP Readings from Last 3 Encounters:  08/28/13 140/90  05/23/13 150/70  09/13/12 142/70   Wt Readings from Last 3 Encounters:  08/28/13 209 lb 12 oz (95.142 kg)  05/23/13 207 lb 6.4 oz (94.076 kg)  04/12/13 204 lb 6.4 oz (92.715 kg)      Review of Systems  Constitutional: Negative for appetite change, fatigue and unexpected weight change.  HENT: Negative for congestion, nosebleeds, sneezing, sore throat and trouble swallowing.   Eyes: Negative for itching and visual disturbance.  Respiratory: Negative for cough.   Cardiovascular: Negative for chest pain, palpitations and leg swelling.  Gastrointestinal: Negative for nausea, diarrhea, blood in stool and abdominal distention.  Genitourinary: Negative for frequency and hematuria.  Musculoskeletal: Negative for back pain, gait problem, joint swelling and neck pain.  Skin: Negative for rash.  Neurological: Negative for dizziness, tremors, speech difficulty and weakness.  Psychiatric/Behavioral: Negative for sleep disturbance, dysphoric mood and agitation. The patient is not nervous/anxious.        Objective:   Physical Exam  Constitutional: He is oriented to person, place, and time. He appears well-developed and well-nourished. No distress.  HENT:  Head: Normocephalic and atraumatic.  Right Ear: External ear normal.  Left Ear: External ear normal.  Nose: Nose normal.  Mouth/Throat: Oropharynx is clear and moist. No oropharyngeal exudate.  Eyes: Conjunctivae and EOM are normal. Pupils are equal, round, and reactive to light. Right eye exhibits no discharge. Left eye exhibits no discharge. No scleral icterus.  Neck:  Normal range of motion. Neck supple. No JVD present. No tracheal deviation present. No thyromegaly present.  Cardiovascular: Normal rate, regular rhythm, normal heart sounds and intact distal pulses.  Exam reveals no gallop and no friction rub.   No murmur heard. Pulmonary/Chest: Effort normal and breath sounds normal. No stridor. No respiratory distress. He has no wheezes. He has no rales. He exhibits no tenderness.  Abdominal: Soft. Bowel sounds are normal. He exhibits no distension and no mass. There is no tenderness. There is no rebound and no guarding.  Genitourinary: Rectum normal, prostate normal and penis normal. Guaiac negative stool. No penile tenderness.  Musculoskeletal: Normal range of motion. He exhibits no edema and no tenderness.  Lymphadenopathy:    He has no cervical adenopathy.  Neurological: He is alert and oriented to person, place, and time. He has normal reflexes. No cranial nerve deficit. He exhibits normal muscle tone. Coordination normal.  Skin: Skin is warm and dry. No rash noted. He is not diaphoretic. No erythema. No pallor.  Psychiatric: He has a normal mood and affect. His behavior is normal. Judgment and thought content normal.  R LS tender B wax L big toe with fungal type changes - seems to be coming off  Lab Results  Component Value Date   WBC 7.1 08/29/2012   HGB 15.0 08/29/2012   HCT 44.2 08/29/2012   PLT 188.0 08/29/2012   GLUCOSE 94 08/29/2012   CHOL 121 08/29/2012   TRIG 58.0 08/29/2012   HDL 55.40 08/29/2012   LDLCALC 54 08/29/2012   ALT 36 08/29/2012   AST 38* 08/29/2012   NA 138 08/29/2012   K 4.6 08/29/2012  CL 103 08/29/2012   CREATININE 1.0 08/29/2012   BUN 13 08/29/2012   CO2 30 08/29/2012   TSH 4.06 08/29/2012   PSA 0.55 08/29/2012   INR 1.0 ratio 03/06/2010   HGBA1C 6.0 08/20/2011     Procedure Note :     Procedure :  Ear irrigation   Indication:  Cerumen impaction B   Risks, including pain, dizziness, eardrum perforation, bleeding, infection and others as  well as benefits were explained to the patient in detail. Verbal consent was obtained and the patient agreed to proceed.    We used "The Elephant Ear Irrigation Device" filled with lukewarm water for irrigation. A large amount wax was recovered. Procedure has also required manual wax removal with an ear loop.   Tolerated well. Complications: None.   Postprocedure instructions :  Call if problems.       Assessment & Plan:

## 2013-08-28 NOTE — Addendum Note (Signed)
Addended by: Cresenciano Lick on: 08/28/2013 02:15 PM   Modules accepted: Orders

## 2013-08-28 NOTE — Assessment & Plan Note (Signed)
Continue with current prescription therapy as reflected on the Med list. Labs  

## 2013-08-28 NOTE — Progress Notes (Signed)
Pre visit review using our clinic review tool, if applicable. No additional management support is needed unless otherwise documented below in the visit note. 

## 2013-08-28 NOTE — Assessment & Plan Note (Signed)
See procedure 

## 2013-08-29 ENCOUNTER — Telehealth: Payer: Self-pay | Admitting: Internal Medicine

## 2013-08-29 NOTE — Telephone Encounter (Signed)
Relevant patient education assigned to patient using Emmi. ° °

## 2013-09-07 ENCOUNTER — Other Ambulatory Visit: Payer: Self-pay | Admitting: Internal Medicine

## 2013-09-10 ENCOUNTER — Other Ambulatory Visit: Payer: Self-pay | Admitting: *Deleted

## 2013-09-10 MED ORDER — LOSARTAN POTASSIUM 100 MG PO TABS: 100.0000 mg | ORAL_TABLET | Freq: Every day | ORAL | Status: DC

## 2013-09-12 ENCOUNTER — Other Ambulatory Visit: Payer: Self-pay | Admitting: Internal Medicine

## 2013-10-09 ENCOUNTER — Ambulatory Visit (INDEPENDENT_AMBULATORY_CARE_PROVIDER_SITE_OTHER): Payer: Medicare Other | Admitting: Cardiovascular Disease

## 2013-10-09 ENCOUNTER — Encounter: Payer: Self-pay | Admitting: Cardiovascular Disease

## 2013-10-09 VITALS — BP 138/77 | HR 59 | Wt 211.0 lb

## 2013-10-09 DIAGNOSIS — E785 Hyperlipidemia, unspecified: Secondary | ICD-10-CM | POA: Diagnosis not present

## 2013-10-09 DIAGNOSIS — I10 Essential (primary) hypertension: Secondary | ICD-10-CM

## 2013-10-09 DIAGNOSIS — I2510 Atherosclerotic heart disease of native coronary artery without angina pectoris: Secondary | ICD-10-CM | POA: Diagnosis not present

## 2013-10-09 MED ORDER — NITROGLYCERIN 0.4 MG SL SUBL: 0.4000 mg | SUBLINGUAL_TABLET | SUBLINGUAL | Status: DC | PRN

## 2013-10-09 NOTE — Progress Notes (Signed)
Patient ID: [name] [name], male   DOB: [birth year], 73 y.o.   MRN: [id number] [name] [name] is 73 yo previously seen by Dr Olevia Perches. [name] He has documented coronary disease and has chronic occlusion of the right coronary had cutting balloon angioplasty to the diagonal branch of the LAD. This occluded in 2008. F/U cath for abnormal ETT 11/11 showed occluded RCA and D1 with 70% mid LAD that Dr Olevia Perches decided to manage medically.  He has been doing fairly well  Myovue 04/12/12 low risk  Overall Impression: There is mild ischemia at the base and mid inferior wall. There is normal wall motion. This is a low risk scan.  LV Ejection Fraction: 69%. LV Wall Motion: Normal Wall Motion.  Mild exertional dyspnea BP seems up but he is not taking BP pill that Dr Laurian Brim suggested   ETT 05/20/13 with 10 mintues exercise and positive ECG However he has no angina and had specific symptoms of throat tightness and bad indigestion  Before all his interventions. 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  All other systems reviewed and negative  General: Affect appropriate Healthy:  appears stated age 73: normal Neck supple with no adenopathy JVP normal no bruits no thyromegaly Lungs clear with no wheezing and good diaphragmatic motion Heart:  S1/S2 no murmur, no rub, gallop or click PMI normal Abdomen: benighn, BS positve, no tenderness, no AAA no bruit.  No HSM or HJR Distal pulses intact with no bruits No edema Neuro non-focal Skin warm and dry No muscular weakness   Current Outpatient Prescriptions  Medication Sig Dispense Refill  . aspirin 81 MG tablet Take 81 mg by mouth daily.         . clopidogrel (PLAVIX) 75 MG tablet TAKE 1 TABLET DAILY  90 tablet  0  . halobetasol (ULTRAVATE) 0.05 % cream USE TWICE A DAY AS NEEDED  15 g  1  . levothyroxine (SYNTHROID, LEVOTHROID) 100 MCG tablet TAKE 1 TABLET DAILY  90 tablet  3  . losartan (COZAAR) 100 MG tablet TAKE 1 TABLET EVERY DAY  90 tablet  3  . losartan (COZAAR) 100 MG tablet Take 100 mg by mouth daily.      . metoprolol succinate (TOPROL-XL) 25 MG 24 hr tablet TAKE 1 TABLET DAILY  90 tablet  3  . mometasone (ELOCON) 0.1 % cream Apply topically 2 (two) times daily.  45 g  1  . mometasone (NASONEX) 50 MCG/ACT nasal spray 2 sprays each nostril qd  17 g  12  . Multiple Vitamins-Minerals (CENTRUM SILVER PO) Take 1 tablet by mouth daily.        . nitroGLYCERIN (NITRODUR - DOSED IN MG/24 HR) 0.4 mg/hr patch Place 0.4 mg onto the skin daily.      . Omega-3 Fatty Acids (FISH OIL) 1200 MG CAPS Take 1 capsule by mouth 2 (two) times daily.        [name] SIMCOR 1000-20 MG 24 hr tablet TAKE 2 TABLETS AT BEDTIME  180 tablet  1  . nitroGLYCERIN (NITROSTAT) 0.4 MG SL tablet Place 1 tablet (0.4 mg total) under the tongue every 5 (five) minutes as needed for chest pain.  25 tablet  6   No current facility-administered medications for this visit.    Allergies  Review of patient's allergies indicates no known allergies.  Electrocardiogram:  SR LAFB RBBB   Assessment and Plan

## 2013-10-09 NOTE — Assessment & Plan Note (Signed)
Well controlled.  Continue current medications and low sodium Dash type diet.    

## 2013-10-09 NOTE — Assessment & Plan Note (Signed)
Cholesterol is at goal.  Continue current dose of statin and diet Rx.  No myalgias or side effects.  F/U  LFT's in 6 months. Lab Results  Component Value Date   LDLCALC 74 08/28/2013

## 2013-10-09 NOTE — Assessment & Plan Note (Signed)
Stable with no angina and good activity level.  Continue medical Rx  

## 2013-10-09 NOTE — Patient Instructions (Signed)
Your physician wants you to follow-up in:  6 MONTHS WITH DR NISHAN  You will receive a reminder letter in the mail two months in advance. If you don't receive a letter, please call our office to schedule the follow-up appointment. Your physician recommends that you continue on your current medications as directed. Please refer to the Current Medication list given to you today. 

## 2013-10-13 ENCOUNTER — Ambulatory Visit (INDEPENDENT_AMBULATORY_CARE_PROVIDER_SITE_OTHER): Payer: Medicare Other | Admitting: Emergency Medicine

## 2013-10-13 VITALS — BP 132/60 | HR 70 | Temp 97.8°F | Resp 18 | Ht 73.0 in | Wt 213.0 lb

## 2013-10-13 DIAGNOSIS — I2510 Atherosclerotic heart disease of native coronary artery without angina pectoris: Secondary | ICD-10-CM | POA: Diagnosis not present

## 2013-10-13 DIAGNOSIS — J0140 Acute pansinusitis, unspecified: Secondary | ICD-10-CM

## 2013-10-13 DIAGNOSIS — J018 Other acute sinusitis: Secondary | ICD-10-CM | POA: Diagnosis not present

## 2013-10-13 MED ORDER — AMOXICILLIN-POT CLAVULANATE 875-125 MG PO TABS: 1.0000 | ORAL_TABLET | Freq: Two times a day (BID) | ORAL | Status: DC

## 2013-10-13 NOTE — Progress Notes (Signed)
[facility] [address] [place] [address] [phone]  Date:  10/13/2013   Name:  [name] [name]   DOB:  25-Mar-1941   MRN:  [id number]  PCP:  Walker Kehr, MD    Chief Complaint: Sinus Problem and Facial Pain   History of Present Illness:  [name] [name] is a 73 y.o. very pleasant male patient who presents with the following:  Ill for three weeks with progressively increasing pressure in right maxillary sinus.  Has post nasal drainage. No fever or chills, cough, wheezing or shortness of breath. No nausea or vomiting. No rash or stool change.  No improvement with over the counter medications or other home remedies. .Denies other complaint or health concern today.  Patient Active Problem List   Diagnosis Date Noted  . Strep throat 11/10/2011  . Well adult exam 08/16/2011  . Actinic keratoses 08/16/2011  . Special screening for malignant neoplasms, colon 08/26/2010  . Benign neoplasm of colon 08/26/2010  . Diverticula of colon 08/26/2010  . HYPERTENSION, BENIGN 02/27/2010  . OTITIS EXTERNA 06/04/2009  . CERUMEN IMPACTION 06/04/2009  . TOBACCO USE, QUIT 06/04/2009  . HEMATOCHEZIA 05/28/2008  . PSORIASIS NEC 05/28/2008  . ABNORMAL GLUCOSE NEC 05/28/2008  . ALLERGIC RHINITIS 12/08/2007  . CORNS AND CALLUSES 12/08/2007  . HYPOTHYROIDISM 04/14/2007  . HYPERLIPIDEMIA 04/14/2007  . SINUSITIS, ACUTE 04/14/2007  . HEMATOSPERMIA 04/14/2007  . MYOCARDIAL INFARCTION, HX OF 02/02/2007  . CORONARY ARTERY DISEASE 02/02/2007    Past Medical History  Diagnosis Date  . CAD (coronary artery disease)   . MI (myocardial infarction)   . Hyperlipemia   . Hypothyroidism   . Allergic rhinitis   . OSA (obstructive sleep apnea)   . Psoriasis   . Family hx of colon cancer     brother  . Hypertension     Past Surgical History  Procedure Laterality Date  . Ptca    . Tonsillectomy    . Cardiac catheterization      History  Substance Use Topics  .  Smoking status: Former Research scientist (life sciences)  . Smokeless tobacco: Never Used  . Alcohol Use: 3.6 oz/week    6 Cans of beer per week    Family History  Problem Relation Age of Onset  . Hypertension    . Colon cancer    . Colon cancer Brother     died 74  . Cancer Brother     bone cancer  . Coronary artery disease Mother   . Heart disease Mother   . Colon polyps Sister     x2  . COPD Father   . Colon cancer Maternal Aunt   . Colon cancer Maternal Uncle   . Colon cancer Maternal Aunt   . Colon cancer Paternal Aunt   . Colon cancer Paternal Grandfather   . Cancer Brother     colon cancer    No Known Allergies  Medication list has been reviewed and updated.  Current Outpatient Prescriptions on File Prior to Visit  Medication Sig Dispense Refill  . aspirin 81 MG tablet Take 81 mg by mouth daily.        . clopidogrel (PLAVIX) 75 MG tablet TAKE 1 TABLET DAILY  90 tablet  0  . halobetasol (ULTRAVATE) 0.05 % cream USE TWICE A DAY AS NEEDED  15 g  1  . levothyroxine (SYNTHROID, LEVOTHROID) 100 MCG tablet TAKE 1 TABLET DAILY  90 tablet  3  . losartan (COZAAR) 100 MG tablet TAKE  1 TABLET EVERY DAY  90 tablet  3  . metoprolol succinate (TOPROL-XL) 25 MG 24 hr tablet TAKE 1 TABLET DAILY  90 tablet  3  . mometasone (ELOCON) 0.1 % cream Apply topically 2 (two) times daily.  45 g  1  . Multiple Vitamins-Minerals (CENTRUM SILVER PO) Take 1 tablet by mouth daily.        . nitroGLYCERIN (NITROSTAT) 0.4 MG SL tablet Place 1 tablet (0.4 mg total) under the tongue every 5 (five) minutes as needed for chest pain.  25 tablet  3  . Omega-3 Fatty Acids (FISH OIL) 1200 MG CAPS Take 1 capsule by mouth 2 (two) times daily.        [name] SIMCOR 1000-20 MG 24 hr tablet TAKE 2 TABLETS AT BEDTIME  180 tablet  1  . mometasone (NASONEX) 50 MCG/ACT nasal spray 2 sprays each nostril qd  17 g  12   No current facility-administered medications on file prior to visit.    Review of Systems:  As per HPI, otherwise negative.     Physical Examination: Filed Vitals:   10/13/13 1238  BP: 132/60  Pulse: 70  Temp: 97.8 F (36.6 C)  Resp: 18   Filed Vitals:   10/13/13 1238  Height: 6\' 1"  (1.854 m)  Weight: 213 lb (96.616 kg)   Body mass index is 28.11 kg/(m^2). Ideal Body Weight: Weight in (lb) to have BMI = 25: 189.1  GEN: WDWN, NAD, Non-toxic, A & O x 3 HEENT: Atraumatic, Normocephalic. Neck supple. No masses, No LAD.  Tender right maxillary sinus Ears and Nose: No external deformity. CV: RRR, No M/G/R. No JVD. No thrill. No extra heart sounds. PULM: CTA B, no wheezes, crackles, rhonchi. No retractions. No resp. distress. No accessory muscle use. ABD: S, NT, ND, +BS. No rebound. No HSM. EXTR: No c/c/e NEURO Normal gait.  PSYCH: Normally interactive. Conversant. Not depressed or anxious appearing.  Calm demeanor.    Assessment and Plan: Sinusitis augmentin mucinex d  Signed,  Ellison Carwin, MD

## 2013-10-13 NOTE — Patient Instructions (Signed)
Sinusitis Sinusitis is redness, soreness, and swelling (inflammation) of the paranasal sinuses. Paranasal sinuses are air pockets within the bones of your face (beneath the eyes, the middle of the forehead, or above the eyes). In healthy paranasal sinuses, mucus is able to drain out, and air is able to circulate through them by way of your nose. However, when your paranasal sinuses are inflamed, mucus and air can become trapped. This can allow bacteria and other germs to grow and cause infection. Sinusitis can develop quickly and last only a short time (acute) or continue over a long period (chronic). Sinusitis that lasts for more than 12 weeks is considered chronic.  CAUSES  Causes of sinusitis include:  Allergies.  Structural abnormalities, such as displacement of the cartilage that separates your nostrils (deviated septum), which can decrease the air flow through your nose and sinuses and affect sinus drainage.  Functional abnormalities, such as when the small hairs (cilia) that line your sinuses and help remove mucus do not work properly or are not present. SYMPTOMS  Symptoms of acute and chronic sinusitis are the same. The primary symptoms are pain and pressure around the affected sinuses. Other symptoms include:  Upper toothache.  Earache.  Headache.  Bad breath.  Decreased sense of smell and taste.  A cough, which worsens when you are lying flat.  Fatigue.  Fever.  Thick drainage from your nose, which often is green and may contain pus (purulent).  Swelling and warmth over the affected sinuses. DIAGNOSIS  Your caregiver will perform a physical exam. During the exam, your caregiver may:  Look in your nose for signs of abnormal growths in your nostrils (nasal polyps).  Tap over the affected sinus to check for signs of infection.  View the inside of your sinuses (endoscopy) with a special imaging device with a light attached (endoscope), which is inserted into your  sinuses. If your caregiver suspects that you have chronic sinusitis, one or more of the following tests may be recommended:  Allergy tests.  Nasal culture--A sample of mucus is taken from your nose and sent to a lab and screened for bacteria.  Nasal cytology--A sample of mucus is taken from your nose and examined by your caregiver to determine if your sinusitis is related to an allergy. TREATMENT  Most cases of acute sinusitis are related to a viral infection and will resolve on their own within 10 days. Sometimes medicines are prescribed to help relieve symptoms (pain medicine, decongestants, nasal steroid sprays, or saline sprays).  However, for sinusitis related to a bacterial infection, your caregiver will prescribe antibiotic medicines. These are medicines that will help kill the bacteria causing the infection.  Rarely, sinusitis is caused by a fungal infection. In theses cases, your caregiver will prescribe antifungal medicine. For some cases of chronic sinusitis, surgery is needed. Generally, these are cases in which sinusitis recurs more than 3 times per year, despite other treatments. HOME CARE INSTRUCTIONS   Drink plenty of water. Water helps thin the mucus so your sinuses can drain more easily.  Use a humidifier.  Inhale steam 3 to 4 times a day (for example, sit in the bathroom with the shower running).  Apply a warm, moist washcloth to your face 3 to 4 times a day, or as directed by your caregiver.  Use saline nasal sprays to help moisten and clean your sinuses.  Take over-the-counter or prescription medicines for pain, discomfort, or fever only as directed by your caregiver. SEEK IMMEDIATE MEDICAL CARE IF:    You have increasing pain or severe headaches.  You have nausea, vomiting, or drowsiness.  You have swelling around your face.  You have vision problems.  You have a stiff neck.  You have difficulty breathing. MAKE SURE YOU:   Understand these  instructions.  Will watch your condition.  Will get help right away if you are not doing well or get worse. Document Released: 04/12/2005 Document Revised: 07/05/2011 Document Reviewed: 04/27/2011 Exit[employer] Patient Information 2015 [employer]. This information is not intended to replace advice given to you by your health care provider. Make sure you discuss any questions you have with your health care provider.  

## 2013-10-17 DIAGNOSIS — H109 Unspecified conjunctivitis: Secondary | ICD-10-CM | POA: Diagnosis not present

## 2013-10-19 ENCOUNTER — Telehealth: Payer: Self-pay | Admitting: Internal Medicine

## 2013-10-19 NOTE — Telephone Encounter (Signed)
Rec'd from [facility] forward 3 pages to Dr. Plotnikov °

## 2013-11-16 ENCOUNTER — Ambulatory Visit (INDEPENDENT_AMBULATORY_CARE_PROVIDER_SITE_OTHER): Payer: Medicare Other | Admitting: Family Medicine

## 2013-11-16 VITALS — BP 116/80 | HR 67 | Temp 98.1°F | Resp 16 | Ht 72.0 in | Wt 212.2 lb

## 2013-11-16 DIAGNOSIS — I2510 Atherosclerotic heart disease of native coronary artery without angina pectoris: Secondary | ICD-10-CM | POA: Diagnosis not present

## 2013-11-16 DIAGNOSIS — M25539 Pain in unspecified wrist: Secondary | ICD-10-CM

## 2013-11-16 DIAGNOSIS — L02519 Cutaneous abscess of unspecified hand: Secondary | ICD-10-CM

## 2013-11-16 DIAGNOSIS — L03114 Cellulitis of left upper limb: Secondary | ICD-10-CM

## 2013-11-16 DIAGNOSIS — L03119 Cellulitis of unspecified part of limb: Secondary | ICD-10-CM

## 2013-11-16 DIAGNOSIS — M25532 Pain in left wrist: Secondary | ICD-10-CM

## 2013-11-16 LAB — CBC WITH DIFFERENTIAL/PLATELET
Basophils Absolute: 0 10*3/uL (ref 0.0–0.1)
Basophils Relative: 0 % (ref 0–1)
Eosinophils Absolute: 0.6 10*3/uL (ref 0.0–0.7)
Eosinophils Relative: 8 % — ABNORMAL HIGH (ref 0–5)
HCT: 39.8 % (ref 39.0–52.0)
Hemoglobin: 14.1 g/dL (ref 13.0–17.0)
LYMPHS ABS: 2.3 10*3/uL (ref 0.7–4.0)
LYMPHS PCT: 29 % (ref 12–46)
MCH: 30.4 pg (ref 26.0–34.0)
MCHC: 35.4 g/dL (ref 30.0–36.0)
MCV: 85.8 fL (ref 78.0–100.0)
Monocytes Absolute: 0.8 10*3/uL (ref 0.1–1.0)
Monocytes Relative: 10 % (ref 3–12)
Neutro Abs: 4.2 10*3/uL (ref 1.7–7.7)
Neutrophils Relative %: 53 % (ref 43–77)
PLATELETS: 177 10*3/uL (ref 150–400)
RBC: 4.64 MIL/uL (ref 4.22–5.81)
RDW: 13.9 % (ref 11.5–15.5)
WBC: 7.9 10*3/uL (ref 4.0–10.5)

## 2013-11-16 LAB — POCT CBC
Granulocyte percent: 62.6 %G (ref 37–80)
HCT, POC: 43.4 % — AB (ref 43.5–53.7)
Hemoglobin: 14.3 g/dL (ref 14.1–18.1)
Lymph, poc: 2.5 (ref 0.6–3.4)
MCH, POC: 30.2 pg (ref 27–31.2)
MCHC: 33 g/dL (ref 31.8–35.4)
MCV: 91.3 fL (ref 80–97)
MID (CBC): 0.7 (ref 0–0.9)
MPV: 7.1 fL (ref 0–99.8)
PLATELET COUNT, POC: 189 10*3/uL (ref 142–424)
POC Granulocyte: 5.3 (ref 2–6.9)
POC LYMPH %: 29 % (ref 10–50)
POC MID %: 8.4 %M (ref 0–12)
RBC: 4.76 M/uL (ref 4.69–6.13)
RDW, POC: 13.7 %
WBC: 8.5 10*3/uL (ref 4.6–10.2)

## 2013-11-16 MED ORDER — CEFTRIAXONE SODIUM 1 G IJ SOLR
1.0000 g | Freq: Once | INTRAMUSCULAR | Status: AC
  Administered 2013-11-16: 1 g via INTRAMUSCULAR

## 2013-11-16 MED ORDER — DOXYCYCLINE HYCLATE 100 MG PO CAPS: 100.0000 mg | ORAL_CAPSULE | Freq: Two times a day (BID) | ORAL | Status: DC

## 2013-11-16 NOTE — Patient Instructions (Signed)
Start taking the doxycycline rx tonight.  Please come and see [place] tomorrow if you are not definitely improved.  You could also certainly go to the [facility] Saturday clinic.  I will be in touch with your lab results- will send you a mychart message

## 2013-11-16 NOTE — Progress Notes (Signed)
[facility] [address] [place] [address] [phone]  Date:  11/16/2013   Name:  [name] [name]   DOB:  09/28/1940   MRN:  [id number]  PCP:  Walker Kehr, MD    Chief Complaint: Hand Pain   History of Present Illness:  [name] [name] is a 72 y.o. very pleasant male patient who presents with the following:  He is here today with a problem with his left hand. It started getting sore last night- he took some tylenol, and noted some swelling and increased tenderness of the dorsal hand as the night went on.  Today his hand continues to be sore, and it hurts to move and use the hand although his strength is maintained,  No fever or other systemic sx, overall he feels well currently.    Patient Active Problem List   Diagnosis Date Noted  . Strep throat 11/10/2011  . Well adult exam 08/16/2011  . Actinic keratoses 08/16/2011  . Special screening for malignant neoplasms, colon 08/26/2010  . Benign neoplasm of colon 08/26/2010  . Diverticula of colon 08/26/2010  . HYPERTENSION, BENIGN 02/27/2010  . OTITIS EXTERNA 06/04/2009  . CERUMEN IMPACTION 06/04/2009  . TOBACCO USE, QUIT 06/04/2009  . HEMATOCHEZIA 05/28/2008  . PSORIASIS NEC 05/28/2008  . ABNORMAL GLUCOSE NEC 05/28/2008  . ALLERGIC RHINITIS 12/08/2007  . CORNS AND CALLUSES 12/08/2007  . HYPOTHYROIDISM 04/14/2007  . HYPERLIPIDEMIA 04/14/2007  . SINUSITIS, ACUTE 04/14/2007  . HEMATOSPERMIA 04/14/2007  . MYOCARDIAL INFARCTION, HX OF 02/02/2007  . CORONARY ARTERY DISEASE 02/02/2007    Past Medical History  Diagnosis Date  . CAD (coronary artery disease)   . MI (myocardial infarction)   . Hyperlipemia   . Hypothyroidism   . Allergic rhinitis   . OSA (obstructive sleep apnea)   . Psoriasis   . Family hx of colon cancer     brother  . Hypertension     Past Surgical History  Procedure Laterality Date  . Ptca    . Tonsillectomy    . Cardiac catheterization      History  Substance  Use Topics  . Smoking status: Former Research scientist (life sciences)  . Smokeless tobacco: Never Used  . Alcohol Use: 3.6 oz/week    6 Cans of beer per week    Family History  Problem Relation Age of Onset  . Hypertension    . Colon cancer    . Colon cancer Brother     died 65  . Cancer Brother     bone cancer  . Coronary artery disease Mother   . Heart disease Mother   . Colon polyps Sister     x2  . COPD Father   . Colon cancer Maternal Aunt   . Colon cancer Maternal Uncle   . Colon cancer Maternal Aunt   . Colon cancer Paternal Aunt   . Colon cancer Paternal Grandfather   . Cancer Brother     colon cancer    No Known Allergies  Medication list has been reviewed and updated.  Current Outpatient Prescriptions on File Prior to Visit  Medication Sig Dispense Refill  . amoxicillin-clavulanate (AUGMENTIN) 875-125 MG per tablet Take 1 tablet by mouth 2 (two) times daily.  20 tablet  0  . aspirin 81 MG tablet Take 81 mg by mouth daily.        . clopidogrel (PLAVIX) 75 MG tablet TAKE 1 TABLET DAILY  90 tablet  0  . halobetasol (ULTRAVATE) 0.05 %  cream USE TWICE A DAY AS NEEDED  15 g  1  . levothyroxine (SYNTHROID, LEVOTHROID) 100 MCG tablet TAKE 1 TABLET DAILY  90 tablet  3  . losartan (COZAAR) 100 MG tablet TAKE 1 TABLET EVERY DAY  90 tablet  3  . metoprolol succinate (TOPROL-XL) 25 MG 24 hr tablet TAKE 1 TABLET DAILY  90 tablet  3  . mometasone (NASONEX) 50 MCG/ACT nasal spray 2 sprays each nostril qd  17 g  12  . Multiple Vitamins-Minerals (CENTRUM SILVER PO) Take 1 tablet by mouth daily.        . nitroGLYCERIN (NITROSTAT) 0.4 MG SL tablet Place 1 tablet (0.4 mg total) under the tongue every 5 (five) minutes as needed for chest pain.  25 tablet  3  . Omega-3 Fatty Acids (FISH OIL) 1200 MG CAPS Take 1 capsule by mouth 2 (two) times daily.        [name] SIMCOR 1000-20 MG 24 hr tablet TAKE 2 TABLETS AT BEDTIME  180 tablet  1  . mometasone (ELOCON) 0.1 % cream Apply topically 2 (two) times daily.  45 g  1    No current facility-administered medications on file prior to visit.    Review of Systems:  As per HPI- otherwise negative.   Physical Examination: Filed Vitals:   11/16/13 1612  BP: 116/80  Pulse: 67  Temp: 98.1 F (36.7 C)  Resp: 16   Filed Vitals:   11/16/13 1612  Height: 6' (1.829 m)  Weight: 212 lb 3.2 oz (96.253 kg)   Body mass index is 28.77 kg/(m^2). Ideal Body Weight: Weight in (lb) to have BMI = 25: 183.9  GEN: WDWN, NAD, Non-toxic, A & O x 3, looks well HEENT: Atraumatic, Normocephalic. Neck supple. No masses, No LAD. Ears and Nose: No external deformity. CV: RRR, No M/G/R. No JVD. No thrill. No extra heart sounds. PULM: CTA B, no wheezes, crackles, rhonchi. No retractions. No resp. distress. No accessory muscle use. EXTR: No c/c/e NEURO Normal gait.  PSYCH: Normally interactive. Conversant. Not depressed or anxious appearing.  Calm demeanor.  Left hand: he is tender and slightly swollen over the dorsal hand and wrist, more on the ulnar side.  He has some mild redness that appears to be tracking up the ulnar side of his wrist.  He has discomfort with flex/ ex of the wrist.  Elbow is negative. No wound or skin lesion  Assessment and Plan: Left wrist pain - Plan: CBC with Differential  Cellulitis of left hand - Plan: cefTRIAXone (ROCEPHIN) injection 1 g, doxycycline (VIBRAMYCIN) 100 MG capsule  Placed in a left wrist splint which helped him feel more comfortable.  Treat for likely cellulitis with IM rocephin and PO doxycycline. Explained need for vigilance as this infection is over the joints of the wrist.  He will come in tomorrow for a recheck if not improved.  Meds ordered this encounter  Medications  . cefTRIAXone (ROCEPHIN) injection 1 g    Sig:     Order Specific Question:  Antibiotic Indication:    Answer:  Cellulitis  . doxycycline (VIBRAMYCIN) 100 MG capsule    Sig: Take 1 capsule (100 mg total) by mouth 2 (two) times daily.    Dispense:  20  capsule    Refill:  0         Signed Lamar Blinks, MD

## 2013-11-17 ENCOUNTER — Ambulatory Visit (INDEPENDENT_AMBULATORY_CARE_PROVIDER_SITE_OTHER): Payer: Medicare Other

## 2013-11-17 ENCOUNTER — Ambulatory Visit (INDEPENDENT_AMBULATORY_CARE_PROVIDER_SITE_OTHER): Payer: Medicare Other | Admitting: Family Medicine

## 2013-11-17 VITALS — BP 124/60 | HR 74 | Temp 98.6°F | Resp 20 | Ht 72.0 in | Wt 211.0 lb

## 2013-11-17 DIAGNOSIS — L03119 Cellulitis of unspecified part of limb: Secondary | ICD-10-CM | POA: Diagnosis not present

## 2013-11-17 DIAGNOSIS — M25539 Pain in unspecified wrist: Secondary | ICD-10-CM

## 2013-11-17 DIAGNOSIS — L02519 Cutaneous abscess of unspecified hand: Secondary | ICD-10-CM | POA: Diagnosis not present

## 2013-11-17 DIAGNOSIS — I2510 Atherosclerotic heart disease of native coronary artery without angina pectoris: Secondary | ICD-10-CM

## 2013-11-17 DIAGNOSIS — L03114 Cellulitis of left upper limb: Secondary | ICD-10-CM

## 2013-11-17 DIAGNOSIS — M25532 Pain in left wrist: Secondary | ICD-10-CM

## 2013-11-17 MED ORDER — CEFTRIAXONE SODIUM 1 G IJ SOLR
1.0000 g | Freq: Once | INTRAMUSCULAR | Status: AC
  Administered 2013-11-17: 1 g via INTRAMUSCULAR

## 2013-11-17 MED ORDER — HYDROCODONE-ACETAMINOPHEN 5-325 MG PO TABS: 1.0000 | ORAL_TABLET | Freq: Four times a day (QID) | ORAL | Status: DC | PRN

## 2013-11-17 NOTE — Progress Notes (Signed)
Subjective:    Patient ID: [name] [name], male    DOB: 1941-03-31, 73 y.o.   MRN: [id number]   PCP: Walker Kehr, MD  Chief Complaint  Patient presents with  . Follow-up    Wrist Infection    Medications, allergies, past medical history, surgical history, family history, social history and problem list reviewed and updated.  Patient Active Problem List   Diagnosis Date Noted  . Well adult exam 08/16/2011  . Actinic keratoses 08/16/2011  . Special screening for malignant neoplasms, colon 08/26/2010  . Benign neoplasm of colon 08/26/2010  . Diverticula of colon 08/26/2010  . HYPERTENSION, BENIGN 02/27/2010  . TOBACCO USE, QUIT 06/04/2009  . HEMATOCHEZIA 05/28/2008  . PSORIASIS NEC 05/28/2008  . ABNORMAL GLUCOSE NEC 05/28/2008  . ALLERGIC RHINITIS 12/08/2007  . CORNS AND CALLUSES 12/08/2007  . HYPOTHYROIDISM 04/14/2007  . HYPERLIPIDEMIA 04/14/2007  . SINUSITIS, ACUTE 04/14/2007  . HEMATOSPERMIA 04/14/2007  . MYOCARDIAL INFARCTION, HX OF 02/02/2007  . CORONARY ARTERY DISEASE 02/02/2007   Prior to Admission medications   Medication Sig Start Date End Date Taking? Authorizing Provider  aspirin 81 MG tablet Take 81 mg by mouth daily.     Yes Historical Provider, MD  clopidogrel (PLAVIX) 75 MG tablet TAKE 1 TABLET DAILY 08/07/13  Yes Josue Hector, MD  doxycycline (VIBRAMYCIN) 100 MG capsule Take 1 capsule (100 mg total) by mouth 2 (two) times daily. 11/16/13  Yes Jessica C Copland, MD  halobetasol (ULTRAVATE) 0.05 % cream USE TWICE A DAY AS NEEDED 06/21/12  Yes Aleksei Plotnikov V, MD  levothyroxine (SYNTHROID, LEVOTHROID) 100 MCG tablet TAKE 1 TABLET DAILY   Yes Aleksei Plotnikov V, MD  losartan (COZAAR) 100 MG tablet TAKE 1 TABLET EVERY DAY 09/12/13  Yes Aleksei Plotnikov V, MD  metoprolol succinate (TOPROL-XL) 25 MG 24 hr tablet TAKE 1 TABLET DAILY   Yes Aleksei Plotnikov V, MD  mometasone (ELOCON) 0.1 % cream Apply topically 2 (two) times daily. 06/21/12  Yes Aleksei  Plotnikov V, MD  mometasone (NASONEX) 50 MCG/ACT nasal spray 2 sprays each nostril qd 03/28/12  Yes Aleksei Plotnikov V, MD  Multiple Vitamins-Minerals (CENTRUM SILVER PO) Take 1 tablet by mouth daily.     Yes Historical Provider, MD  nitroGLYCERIN (NITROSTAT) 0.4 MG SL tablet Place 1 tablet (0.4 mg total) under the tongue every 5 (five) minutes as needed for chest pain. 10/09/13  Yes Josue Hector, MD  Omega-3 Fatty Acids (FISH OIL) 1200 MG CAPS Take 1 capsule by mouth 2 (two) times daily.     Yes Historical Provider, MD  [facility] 1000-20 MG 24 hr tablet TAKE 2 TABLETS AT BEDTIME 03/03/13  Yes Josue Hector, MD    HPI  Presents for re-evaluation of LEFT wrist pain, redness and swelling.  He is RIGHT hand dominant.  He was seen here yesterday reporting soreness that began the night before with progressively worsening pain and swelling.  The pain was primarily associated with movement, and he had FROM and good strength, and no systemic symptoms. CBC was normal. He received 1 g Rocephin and started doxycyline, given a thumb spica splint for comfort, and was advised to RTC today if not significantly improved.  Today he has increased pain and swelling. No fever, chills.  Mild lightheadedness.  He denies any wound/trauma/injury associated with his symptoms, though he recalls that 2-3 weeks prior to the onset he was playing with a stray cat outside his office and it scratched him on the LEFT thumb.  He immediately cleaned it with hydrogen peroxide and rubbing alcohol, and had no pain, swelling, redness until the day before yesterday.  No history of gout.  Notes that acetaminophen helped some last night, but wore off.   Review of Systems As above.    Objective:   Physical Exam  Constitutional: He is oriented to person, place, and time. He appears well-developed and well-nourished. He is active and cooperative.  BP 124/60  Pulse 74  Temp(Src) 98.6 F (37 C) (Oral)  Resp 20  Ht 6' (1.829 m)  Wt  211 lb (95.709 kg)  BMI 28.61 kg/m2  SpO2 97%   Eyes: Conjunctivae are normal. No scleral icterus.  Cardiovascular: Normal rate.   Pulmonary/Chest: Effort normal.  Musculoskeletal:       Left wrist: He exhibits decreased range of motion, tenderness, bony tenderness and swelling.       Left forearm: Normal.       Arms:      Left hand: He exhibits decreased range of motion and tenderness.  Erythema and edema worst at the wrist, with what appears to be a streak along the ulnar aspect that extends several inches.   Lymphadenopathy:    He has no axillary adenopathy.       Left: No epitrochlear adenopathy present.  Neurological: He is alert and oriented to person, place, and time.  Skin: Skin is warm, dry and intact. No rash noted.  Psychiatric: He has a normal mood and affect. His speech is normal and behavior is normal.      LEFT Wrist: [facility] reading (PRIMARY) by  Dr. Carlota Raspberry.  No acute bony deformity.  Mild soft tissue swelling noted about the wrist.      Assessment & Plan:  1. Left wrist pain Continue splinting for comfort.  - DG Wrist Complete Left - HYDROcodone-acetaminophen (NORCO) 5-325 MG per tablet; Take 1 tablet by mouth every 6 (six) hours as needed.  Dispense: 30 tablet; Refill: 0  2. Cellulitis of left hand/wrist Continue doxycycline. RTC tomorrow for re-evaluation. - cefTRIAXone (ROCEPHIN) injection 1 g; Inject 1 g into the muscle once.   Fara Chute, PA-C Physician Assistant-Certified [facility]

## 2013-11-17 NOTE — Patient Instructions (Signed)
Continue the antibiotic.

## 2013-11-18 ENCOUNTER — Ambulatory Visit (INDEPENDENT_AMBULATORY_CARE_PROVIDER_SITE_OTHER): Payer: Medicare Other | Admitting: Emergency Medicine

## 2013-11-18 VITALS — BP 124/66 | HR 71 | Temp 98.9°F | Resp 20 | Ht 72.0 in | Wt 211.0 lb

## 2013-11-18 DIAGNOSIS — M25532 Pain in left wrist: Secondary | ICD-10-CM

## 2013-11-18 DIAGNOSIS — I2510 Atherosclerotic heart disease of native coronary artery without angina pectoris: Secondary | ICD-10-CM | POA: Diagnosis not present

## 2013-11-18 DIAGNOSIS — M25539 Pain in unspecified wrist: Secondary | ICD-10-CM

## 2013-11-18 MED ORDER — COLCHICINE 0.6 MG PO TABS: ORAL_TABLET | ORAL | Status: DC

## 2013-11-18 NOTE — Patient Instructions (Signed)
Hold off on the simvastatin while you're taking the colchicine.  Gout Gout is an inflammatory arthritis caused by a buildup of uric acid crystals in the joints. Uric acid is a chemical that is normally present in the blood. When the level of uric acid in the blood is too high it can form crystals that deposit in your joints and tissues. This causes joint redness, soreness, and swelling (inflammation). Repeat attacks are common. Over time, uric acid crystals can form into masses (tophi) near a joint, destroying bone and causing disfigurement. Gout is treatable and often preventable. CAUSES  The disease begins with elevated levels of uric acid in the blood. Uric acid is produced by your body when it breaks down a naturally found substance called purines. Certain foods you eat, such as meats and fish, contain high amounts of purines. Causes of an elevated uric acid level include:  Being passed down from parent to child (heredity).  Diseases that cause increased uric acid production (such as obesity, psoriasis, and certain cancers).  Excessive alcohol use.  Diet, especially diets rich in meat and seafood.  Medicines, including certain cancer-fighting medicines (chemotherapy), water pills (diuretics), and aspirin.  Chronic kidney disease. The kidneys are no longer able to remove uric acid well.  Problems with metabolism. Conditions strongly associated with gout include:  Obesity.  High blood pressure.  High cholesterol.  Diabetes. Not everyone with elevated uric acid levels gets gout. It is not understood why some people get gout and others do not. Surgery, joint injury, and eating too much of certain foods are some of the factors that can lead to gout attacks. SYMPTOMS   An attack of gout comes on quickly. It causes intense pain with redness, swelling, and warmth in a joint.  Fever can occur.  Often, only one joint is involved. Certain joints are more commonly involved:  Base of the  big toe.  Knee.  Ankle.  Wrist.  Finger. Without treatment, an attack usually goes away in a few days to weeks. Between attacks, you usually will not have symptoms, which is different from many other forms of arthritis. DIAGNOSIS  Your caregiver will suspect gout based on your symptoms and exam. In some cases, tests may be recommended. The tests may include:  Blood tests.  Urine tests.  X-rays.  Joint fluid exam. This exam requires a needle to remove fluid from the joint (arthrocentesis). Using a microscope, gout is confirmed when uric acid crystals are seen in the joint fluid. TREATMENT  There are two phases to gout treatment: treating the sudden onset (acute) attack and preventing attacks (prophylaxis).  Treatment of an Acute Attack.  Medicines are used. These include anti-inflammatory medicines or steroid medicines.  An injection of steroid medicine into the affected joint is sometimes necessary.  The painful joint is rested. Movement can worsen the arthritis.  You may use warm or cold treatments on painful joints, depending which works best for you.  Treatment to Prevent Attacks.  If you suffer from frequent gout attacks, your caregiver may advise preventive medicine. These medicines are started after the acute attack subsides. These medicines either help your kidneys eliminate uric acid from your body or decrease your uric acid production. You may need to stay on these medicines for a very long time.  The early phase of treatment with preventive medicine can be associated with an increase in acute gout attacks. For this reason, during the first few months of treatment, your caregiver may also advise you to  take medicines usually used for acute gout treatment. Be sure you understand your caregiver's directions. Your caregiver may make several adjustments to your medicine dose before these medicines are effective.  Discuss dietary treatment with your caregiver or dietitian.  Alcohol and drinks high in sugar and fructose and foods such as meat, poultry, and seafood can increase uric acid levels. Your caregiver or dietitian can advise you on drinks and foods that should be limited. HOME CARE INSTRUCTIONS   Do not take aspirin to relieve pain. This raises uric acid levels.  Only take over-the-counter or prescription medicines for pain, discomfort, or fever as directed by your caregiver.  Rest the joint as much as possible. When in bed, keep sheets and blankets off painful areas.  Keep the affected joint raised (elevated).  Apply warm or cold treatments to painful joints. Use of warm or cold treatments depends on which works best for you.  Use crutches if the painful joint is in your leg.  Drink enough fluids to keep your urine clear or pale yellow. This helps your body get rid of uric acid. Limit alcohol, sugary drinks, and fructose drinks.  Follow your dietary instructions. Pay careful attention to the amount of protein you eat. Your daily diet should emphasize fruits, vegetables, whole grains, and fat-free or low-fat milk products. Discuss the use of coffee, vitamin C, and cherries with your caregiver or dietitian. These may be helpful in lowering uric acid levels.  Maintain a healthy body weight. SEEK MEDICAL CARE IF:   You develop diarrhea, vomiting, or any side effects from medicines.  You do not feel better in 24 hours, or you are getting worse. SEEK IMMEDIATE MEDICAL CARE IF:   Your joint becomes suddenly more tender, and you have chills or a fever. MAKE SURE YOU:   Understand these instructions.  Will watch your condition.  Will get help right away if you are not doing well or get worse. Document Released: 04/09/2000 Document Revised: 08/27/2013 Document Reviewed: 11/24/2011 [facility] Patient Information 2015 [place], [place]. This information is not intended to replace advice given to you by your health care provider. Make sure you discuss any  questions you have with your health care provider.

## 2013-11-18 NOTE — Progress Notes (Signed)
Xray read and patient discussed with Harrison Mons, PA-C. I also interviewed and examined patient.  . Agree with exam, assessment and plan of care per her note.

## 2013-11-18 NOTE — Progress Notes (Signed)
   Subjective:    Patient ID: [name] [name], male    DOB: 08-27-1940, 73 y.o.   MRN: [id number]   PCP: Walker Kehr, MD  Chief Complaint  Patient presents with  . Follow-up    recheck today for left wrist and hand    Medications, allergies, past medical history, surgical history, family history, social history and problem list reviewed and updated.  HPI  Presents for re-evaluation of LEFT wrist pain.  Please see my note from his visit yesterday and Dr. Lillie Fragmin note from the day before.  He's tolerating Doxycycline, though he had a little nausea this morning with the dose.  He has had no improvement overnight after the second dose of Rocephin. No worsening.  No fever, chills, dizziness.  No increased redness, swelling, pain.  Review of Systems     Objective:   Physical Exam  Constitutional: He is oriented to person, place, and time. He appears well-developed and well-nourished. He is active and cooperative. No distress.  BP 124/66  Pulse 71  Temp(Src) 98.9 F (37.2 C) (Oral)  Resp 20  Ht 6' (1.829 m)  Wt 211 lb (95.709 kg)  BMI 28.61 kg/m2  SpO2 95%   HENT:  Head: Normocephalic and atraumatic.  Eyes: Conjunctivae are normal.  Pulmonary/Chest: Effort normal.  Musculoskeletal:       Left wrist: He exhibits decreased range of motion, tenderness and swelling.       Left forearm: Normal.       Left hand: He exhibits decreased range of motion, tenderness and swelling.  Neurological: He is alert and oriented to person, place, and time.  Skin: Skin is warm and dry. There is erythema.  Psychiatric: He has a normal mood and affect. His behavior is normal.   Evaluated with Dr. Everlene Farrier.       Assessment & Plan:  1. Left wrist pain As there has been no improvement with 2 doses of rocephin and 48 hours on Doxycycline, with normal CBC, xray and no fever or other systemic symptoms, suspect gout is the cause of his symptoms. Start colchicine, continue Doxycycline. Refer to  [employer] for a visit tomorrow. - colchicine 0.6 MG tablet; 1 PO TID; reduce dose if causes GI distress.  Dispense: 30 tablet; Refill: 0 - Rheumatoid factor - Uric acid - POCT SEDIMENTATION RATE - ANA - Ambulatory referral to Orthopedic Surgery   Fara Chute, PA-C Physician Assistant-Certified [facility]

## 2013-11-19 ENCOUNTER — Other Ambulatory Visit (INDEPENDENT_AMBULATORY_CARE_PROVIDER_SITE_OTHER): Payer: Medicare Other

## 2013-11-19 DIAGNOSIS — M25539 Pain in unspecified wrist: Secondary | ICD-10-CM

## 2013-11-19 LAB — RHEUMATOID FACTOR: Rhuematoid fact SerPl-aCnc: <10 IU/mL (ref <=14)

## 2013-11-19 LAB — POCT SEDIMENTATION RATE: POCT SED RATE: 55 mm/hr — AB (ref 0–22)

## 2013-11-19 LAB — URIC ACID: Uric Acid, Serum: 5.7 mg/dL (ref 4.0–7.8)

## 2013-11-20 ENCOUNTER — Other Ambulatory Visit: Payer: Self-pay | Admitting: Emergency Medicine

## 2013-11-20 LAB — ANA: ANA: NEGATIVE

## 2013-11-22 ENCOUNTER — Encounter: Payer: Self-pay | Admitting: Family Medicine

## 2013-11-30 ENCOUNTER — Encounter: Payer: Self-pay | Admitting: Internal Medicine

## 2013-11-30 ENCOUNTER — Ambulatory Visit (INDEPENDENT_AMBULATORY_CARE_PROVIDER_SITE_OTHER): Payer: Medicare Other | Admitting: Internal Medicine

## 2013-11-30 VITALS — BP 131/80 | HR 66 | Temp 98.1°F | Wt 211.0 lb

## 2013-11-30 DIAGNOSIS — M25539 Pain in unspecified wrist: Secondary | ICD-10-CM

## 2013-11-30 DIAGNOSIS — M25532 Pain in left wrist: Secondary | ICD-10-CM

## 2013-11-30 DIAGNOSIS — I2510 Atherosclerotic heart disease of native coronary artery without angina pectoris: Secondary | ICD-10-CM | POA: Diagnosis not present

## 2013-11-30 DIAGNOSIS — I10 Essential (primary) hypertension: Secondary | ICD-10-CM | POA: Diagnosis not present

## 2013-11-30 MED ORDER — PREDNISONE 10 MG PO TABS: ORAL_TABLET | ORAL | Status: DC

## 2013-11-30 MED ORDER — TRAMADOL HCL 50 MG PO TABS: 50.0000 mg | ORAL_TABLET | Freq: Two times a day (BID) | ORAL | Status: DC | PRN

## 2013-11-30 NOTE — Assessment & Plan Note (Signed)
Prednisone 10 mg: take 4 tabs a day x 3 days; then 3 tabs a day x 4 days; then 2 tabs a day x 4 days, then 1 tab a day x 6 days, then stop. Take pc.  Potential benefits of a short term steroid  use as well as potential risks  and complications were explained to the patient and were aknowledged.

## 2013-11-30 NOTE — Progress Notes (Signed)
Pre visit review using our clinic review tool, if applicable. No additional management support is needed unless otherwise documented below in the visit note. 

## 2013-11-30 NOTE — Progress Notes (Signed)
Subjective:    Wrist Pain  The pain is present in the left hand and left wrist. This is a new problem. The current episode started 1 to 4 weeks ago. There has been no history of extremity trauma. The problem occurs constantly. The problem has been gradually improving. The pain is mild.    He was seen at [facility] x 3 and by Ortho - Dr Rip Harbour once - he ws told it was gout. He an antibiotic and a colchicine, had X ray -  80% better now   The patient presents for a follow-up of  chronic hypertension, chronic dyslipidemia, CAD controlled with medicines  BP Readings from Last 3 Encounters:  11/30/13 131/80  11/18/13 124/66  11/17/13 124/60   Wt Readings from Last 3 Encounters:  11/30/13 211 lb (95.709 kg)  11/18/13 211 lb (95.709 kg)  11/17/13 211 lb (95.709 kg)      Review of Systems  Constitutional: Negative for appetite change, fatigue and unexpected weight change.  HENT: Negative for congestion, nosebleeds, sneezing, sore throat and trouble swallowing.   Eyes: Negative for itching and visual disturbance.  Respiratory: Negative for cough.   Cardiovascular: Negative for chest pain, palpitations and leg swelling.  Gastrointestinal: Negative for nausea, diarrhea, blood in stool and abdominal distention.  Genitourinary: Negative for frequency and hematuria.  Musculoskeletal: Negative for back pain, gait problem, joint swelling and neck pain.  Skin: Negative for rash.  Neurological: Negative for dizziness, tremors, speech difficulty and weakness.  Psychiatric/Behavioral: Negative for sleep disturbance, dysphoric mood and agitation. The patient is not nervous/anxious.        Objective:   Physical Exam  Constitutional: He is oriented to person, place, and time. He appears well-developed and well-nourished. No distress.  HENT:  Head: Normocephalic and atraumatic.  Right Ear: External ear normal.  Left Ear: External ear normal.  Nose: Nose normal.  Mouth/Throat: Oropharynx is  clear and moist. No oropharyngeal exudate.  Eyes: Conjunctivae and EOM are normal. Pupils are equal, round, and reactive to light. Right eye exhibits no discharge. Left eye exhibits no discharge. No scleral icterus.  Neck: Normal range of motion. Neck supple. No JVD present. No tracheal deviation present. No thyromegaly present.  Cardiovascular: Normal rate, regular rhythm, normal heart sounds and intact distal pulses.  Exam reveals no gallop and no friction rub.   No murmur heard. Pulmonary/Chest: Effort normal and breath sounds normal. No stridor. No respiratory distress. He has no wheezes. He has no rales. He exhibits no tenderness.  Abdominal: Soft. Bowel sounds are normal. He exhibits no distension and no mass. There is no tenderness. There is no rebound and no guarding.  Genitourinary: Rectum normal, prostate normal and penis normal. Guaiac negative stool. No penile tenderness.  Musculoskeletal: Normal range of motion. He exhibits no edema and no tenderness.  Lymphadenopathy:    He has no cervical adenopathy.  Neurological: He is alert and oriented to person, place, and time. He has normal reflexes. No cranial nerve deficit. He exhibits normal muscle tone. Coordination normal.  Skin: Skin is warm and dry. No rash noted. He is not diaphoretic. No erythema. No pallor.  Psychiatric: He has a normal mood and affect. His behavior is normal. Judgment and thought content normal.  R LS is not tender L wrist and dorsal hand is swollen, tender, pink, warm  Lab Results  Component Value Date   WBC 8.5 11/16/2013   HGB 14.3 11/16/2013   HCT 43.4* 11/16/2013   PLT 177 11/16/2013  GLUCOSE 89 08/28/2013   CHOL 147 08/28/2013   TRIG 108.0 08/28/2013   HDL 51.70 08/28/2013   LDLCALC 74 08/28/2013   ALT 38 08/28/2013   AST 36 08/28/2013   NA 140 08/28/2013   K 4.0 08/28/2013   CL 104 08/28/2013   CREATININE 0.9 08/28/2013   BUN 13 08/28/2013   CO2 29 08/28/2013   TSH 3.13 08/28/2013   PSA 0.56 08/28/2013   INR 1.0 ratio  03/06/2010   HGBA1C 5.8 08/28/2013              Assessment & Plan:

## 2013-12-01 ENCOUNTER — Other Ambulatory Visit: Payer: Self-pay | Admitting: Cardiovascular Disease

## 2013-12-05 NOTE — Assessment & Plan Note (Signed)
Continue with current prescription therapy as reflected on the Med list. Labs  

## 2013-12-12 ENCOUNTER — Other Ambulatory Visit: Payer: Self-pay | Admitting: Cardiovascular Disease

## 2014-03-10 ENCOUNTER — Emergency Department (HOSPITAL_COMMUNITY)
Admission: EM | Admit: 2014-03-10 | Discharge: 2014-03-10 | Disposition: A | Discharge status: Home / Self Care | Payer: Medicare Other | Attending: Emergency Medicine | Admitting: Emergency Medicine

## 2014-03-10 ENCOUNTER — Encounter (HOSPITAL_COMMUNITY): Payer: Self-pay | Admitting: Emergency Medicine

## 2014-03-10 DIAGNOSIS — Z7982 Long term (current) use of aspirin: Secondary | ICD-10-CM | POA: Diagnosis not present

## 2014-03-10 DIAGNOSIS — Z8669 Personal history of other diseases of the nervous system and sense organs: Secondary | ICD-10-CM | POA: Diagnosis not present

## 2014-03-10 DIAGNOSIS — I2510 Atherosclerotic heart disease of native coronary artery without angina pectoris: Secondary | ICD-10-CM | POA: Insufficient documentation

## 2014-03-10 DIAGNOSIS — E785 Hyperlipidemia, unspecified: Secondary | ICD-10-CM | POA: Diagnosis not present

## 2014-03-10 DIAGNOSIS — Z79899 Other long term (current) drug therapy: Secondary | ICD-10-CM | POA: Insufficient documentation

## 2014-03-10 DIAGNOSIS — Z7951 Long term (current) use of inhaled steroids: Secondary | ICD-10-CM | POA: Diagnosis not present

## 2014-03-10 DIAGNOSIS — R112 Nausea with vomiting, unspecified: Secondary | ICD-10-CM | POA: Diagnosis not present

## 2014-03-10 DIAGNOSIS — R197 Diarrhea, unspecified: Secondary | ICD-10-CM

## 2014-03-10 DIAGNOSIS — Z7952 Long term (current) use of systemic steroids: Secondary | ICD-10-CM | POA: Diagnosis not present

## 2014-03-10 DIAGNOSIS — R55 Syncope and collapse: Secondary | ICD-10-CM

## 2014-03-10 DIAGNOSIS — R531 Weakness: Secondary | ICD-10-CM | POA: Diagnosis not present

## 2014-03-10 DIAGNOSIS — Z7902 Long term (current) use of antithrombotics/antiplatelets: Secondary | ICD-10-CM | POA: Insufficient documentation

## 2014-03-10 DIAGNOSIS — R404 Transient alteration of awareness: Secondary | ICD-10-CM | POA: Diagnosis not present

## 2014-03-10 DIAGNOSIS — E039 Hypothyroidism, unspecified: Secondary | ICD-10-CM | POA: Diagnosis not present

## 2014-03-10 DIAGNOSIS — I10 Essential (primary) hypertension: Secondary | ICD-10-CM | POA: Diagnosis not present

## 2014-03-10 DIAGNOSIS — Z9889 Other specified postprocedural states: Secondary | ICD-10-CM | POA: Diagnosis not present

## 2014-03-10 DIAGNOSIS — R110 Nausea: Secondary | ICD-10-CM | POA: Insufficient documentation

## 2014-03-10 DIAGNOSIS — Z87891 Personal history of nicotine dependence: Secondary | ICD-10-CM | POA: Diagnosis not present

## 2014-03-10 DIAGNOSIS — I252 Old myocardial infarction: Secondary | ICD-10-CM | POA: Insufficient documentation

## 2014-03-10 DIAGNOSIS — L409 Psoriasis, unspecified: Secondary | ICD-10-CM | POA: Diagnosis not present

## 2014-03-10 DIAGNOSIS — E869 Volume depletion, unspecified: Secondary | ICD-10-CM | POA: Diagnosis not present

## 2014-03-10 LAB — COMPREHENSIVE METABOLIC PANEL
ALBUMIN: 3.7 g/dL (ref 3.5–5.2)
ALK PHOS: 49 U/L (ref 39–117)
ALT: 22 U/L (ref 0–53)
ANION GAP: 14 (ref 5–15)
AST: 25 U/L (ref 0–37)
BILIRUBIN TOTAL: 1 mg/dL (ref 0.3–1.2)
BUN: 15 mg/dL (ref 6–23)
CHLORIDE: 103 meq/L (ref 96–112)
CO2: 24 mEq/L (ref 19–32)
Calcium: 9.3 mg/dL (ref 8.4–10.5)
Creatinine, Ser: 0.92 mg/dL (ref 0.50–1.35)
GFR calc Af Amer: >90 mL/min (ref >90)
GFR calc non Af Amer: 82 mL/min — ABNORMAL LOW (ref >90)
Glucose, Bld: 110 mg/dL — ABNORMAL HIGH (ref 70–99)
POTASSIUM: 4.7 meq/L (ref 3.7–5.3)
Sodium: 141 mEq/L (ref 137–147)
TOTAL PROTEIN: 7.1 g/dL (ref 6.0–8.3)

## 2014-03-10 LAB — CBC WITH DIFFERENTIAL/PLATELET
BASOS ABS: 0 10*3/uL (ref 0.0–0.1)
Basophils Relative: 0 % (ref 0–1)
EOS ABS: 0.3 10*3/uL (ref 0.0–0.7)
Eosinophils Relative: 3 % (ref 0–5)
HCT: 45.4 % (ref 39.0–52.0)
HEMOGLOBIN: 15.3 g/dL (ref 13.0–17.0)
Lymphocytes Relative: 6 % — ABNORMAL LOW (ref 12–46)
Lymphs Abs: 0.6 10*3/uL — ABNORMAL LOW (ref 0.7–4.0)
MCH: 30.4 pg (ref 26.0–34.0)
MCHC: 33.7 g/dL (ref 30.0–36.0)
MCV: 90.3 fL (ref 78.0–100.0)
MONOS PCT: 6 % (ref 3–12)
Monocytes Absolute: 0.7 10*3/uL (ref 0.1–1.0)
NEUTROS ABS: 9 10*3/uL — AB (ref 1.7–7.7)
NEUTROS PCT: 85 % — AB (ref 43–77)
Platelets: 165 10*3/uL (ref 150–400)
RBC: 5.03 MIL/uL (ref 4.22–5.81)
RDW: 13.5 % (ref 11.5–15.5)
WBC: 10.6 10*3/uL — ABNORMAL HIGH (ref 4.0–10.5)

## 2014-03-10 LAB — TROPONIN I: Troponin I: <0.3 ng/mL (ref <0.30)

## 2014-03-10 MED ORDER — SODIUM CHLORIDE 0.9 % IV BOLUS (SEPSIS)
2000.0000 mL | Freq: Once | INTRAVENOUS | Status: AC
Start: 2014-03-10 — End: 2014-03-10
  Administered 2014-03-10: 2000 mL via INTRAVENOUS

## 2014-03-10 MED ORDER — SODIUM CHLORIDE 0.9 % IV SOLN: 1000.0000 mL | Freq: Once | INTRAVENOUS | Status: DC

## 2014-03-10 MED ORDER — ONDANSETRON 8 MG PO TBDP: 8.0000 mg | ORAL_TABLET | Freq: Three times a day (TID) | ORAL | Status: DC | PRN

## 2014-03-10 MED ORDER — SODIUM CHLORIDE 0.9 % IV SOLN: 1000.0000 mL | INTRAVENOUS | Status: DC

## 2014-03-10 NOTE — ED Provider Notes (Signed)
CSN: [id number]     Arrival date & time 03/10/14  1515 History   First MD Initiated Contact with Patient 03/10/14 1521     Chief Complaint  Patient presents with  . Loss of Consciousness  . Nausea      HPI Patient reports diarrhea over the past 24 hours.  He awoke this morning and felt weak.  He went back to bed.  He stayed in bed most the morning and continued to feel weak.  He finally got himself up out of bed and walk to the kitchen.  He drank ginger ale secondary to nausea.  One episode of vomiting occurred.he went to the sofa became diaphoretic and had a transient syncopal episode that wife reports lasted for 2 or 3 seconds.  He became very pale.  No bowel or bladder incontinence.  No tongue biting.  No seizure activity noted by family.  He merely return to baseline but the wife was concerned and called 911.  He states he feels much better at this time.  Wife reports he looks much better at this time.  Denies chest pain.  No preceding chest pain or palpitations prior to the syncope.  He states he was feeling lightheaded prior to that.  He reports decreased urinary output today and really no oral intake over the past 12 hours.   Past Medical History  Diagnosis Date  . CAD (coronary artery disease)   . MI (myocardial infarction)   . Hyperlipemia   . Hypothyroidism   . Allergic rhinitis   . OSA (obstructive sleep apnea)   . Psoriasis   . Family hx of colon cancer     brother  . Hypertension   . CERUMEN IMPACTION 06/04/2009    5/15     Past Surgical History  Procedure Laterality Date  . Ptca    . Tonsillectomy    . Cardiac catheterization     Family History  Problem Relation Age of Onset  . Hypertension    . Colon cancer    . Colon cancer Brother     died 19  . Cancer Brother     bone cancer  . Coronary artery disease Mother   . Heart disease Mother   . Colon polyps Sister     x2  . COPD Father   . Colon cancer Maternal Aunt   . Colon cancer Maternal Uncle   . Colon  cancer Maternal Aunt   . Colon cancer Paternal Aunt   . Colon cancer Paternal Grandfather   . Cancer Brother     colon cancer   History  Substance Use Topics  . Smoking status: Former Research scientist (life sciences)  . Smokeless tobacco: Never Used  . Alcohol Use: 3.6 oz/week    6 Cans of beer per week    Review of Systems  All other systems reviewed and are negative.     Allergies  Review of patient's allergies indicates no known allergies.  Home Medications   Prior to Admission medications   Medication Sig Start Date End Date Taking? Authorizing Provider  aspirin 81 MG tablet Take 81 mg by mouth daily.      Historical Provider, MD  clopidogrel (PLAVIX) 75 MG tablet TAKE 1 TABLET DAILY    Josue Hector, MD  colchicine 0.6 MG tablet 1 PO TID; reduce dose if causes GI distress. 11/18/13   Chelle Janalee Dane, PA-C  halobetasol (ULTRAVATE) 0.05 % cream USE TWICE A DAY AS NEEDED 06/21/12   Aleksei Plotnikov V,  MD  levothyroxine (SYNTHROID, LEVOTHROID) 100 MCG tablet TAKE 1 TABLET DAILY    Aleksei Plotnikov V, MD  losartan (COZAAR) 100 MG tablet TAKE 1 TABLET EVERY DAY 09/12/13   Aleksei Plotnikov V, MD  metoprolol succinate (TOPROL-XL) 25 MG 24 hr tablet TAKE 1 TABLET DAILY    Aleksei Plotnikov V, MD  mometasone (ELOCON) 0.1 % cream Apply topically 2 (two) times daily. 06/21/12   Aleksei Plotnikov V, MD  mometasone (NASONEX) 50 MCG/ACT nasal spray 2 sprays each nostril qd 03/28/12   Aleksei Plotnikov V, MD  Multiple Vitamins-Minerals (CENTRUM SILVER PO) Take 1 tablet by mouth daily.      Historical Provider, MD  nitroGLYCERIN (NITROSTAT) 0.4 MG SL tablet Place 1 tablet (0.4 mg total) under the tongue every 5 (five) minutes as needed for chest pain. 10/09/13   Josue Hector, MD  Omega-3 Fatty Acids (FISH OIL) 1200 MG CAPS Take 1 capsule by mouth 2 (two) times daily.      Historical Provider, MD  predniSONE (DELTASONE) 10 MG tablet Prednisone 10 mg: take 4 tabs a day x 3 days; then 3 tabs a day x 4 days; then 2  tabs a day x 4 days, then 1 tab a day x 6 days, then stop. Take pc. 11/30/13   Cassandria Anger, MD  SIMCOR 1000-20 MG 24 hr tablet TAKE 2 TABLETS AT BEDTIME (PATIENT NEEDS TO CONTACT OFFICE TO SCHEDULE APPOINTMENT FOR FUTURE REFILLS [id number]) 12/13/13   Josue Hector, MD  traMADol (ULTRAM) 50 MG tablet Take 1-2 tablets (50-100 mg total) by mouth 2 (two) times daily as needed. 11/30/13   Aleksei Plotnikov V, MD   BP 111/53 mmHg  Pulse 78  Temp(Src) 98.1 F (36.7 C)  Resp 16  Ht 6\' 1"  (1.854 m)  Wt 205 lb (92.987 kg)  BMI 27.05 kg/m2  SpO2 96% Physical Exam  Constitutional: He is oriented to person, place, and time. He appears well-developed and well-nourished.  HENT:  Head: Normocephalic and atraumatic.  Eyes: EOM are normal.  Neck: Normal range of motion.  Cardiovascular: Normal rate, regular rhythm, normal heart sounds and intact distal pulses.   Pulmonary/Chest: Effort normal and breath sounds normal. No respiratory distress.  Abdominal: Soft. He exhibits no distension. There is no tenderness.  Musculoskeletal: Normal range of motion.  Neurological: He is alert and oriented to person, place, and time.  Skin: Skin is warm and dry.  Psychiatric: He has a normal mood and affect. Judgment normal.  Nursing note and vitals reviewed.   ED Course  Procedures (including critical care time) Labs Review Labs Reviewed  CBC WITH DIFFERENTIAL - Abnormal; Notable for the following:    WBC 10.6 (*)    Neutrophils Relative % 85 (*)    Neutro Abs 9.0 (*)    Lymphocytes Relative 6 (*)    Lymphs Abs 0.6 (*)    All other components within normal limits  COMPREHENSIVE METABOLIC PANEL - Abnormal; Notable for the following:    Glucose, Bld 110 (*)    GFR calc non Af Amer 82 (*)    All other components within normal limits  TROPONIN I    Imaging Review No results found.   EKG Interpretation   Date/Time:  Sunday March 10 2014 15:22:44 EST Ventricular Rate:  76 PR Interval:   175 QRS Duration: 140 QT Interval:  423 QTC Calculation: 476 R Axis:   -60 Text Interpretation:  Sinus rhythm RBBB and LAFB LVH by voltage No  significant  change was found Confirmed by Merin Borjon  MD, Lennette Bihari ([id number]) on  03/10/2014 3:53:25 PM      MDM   Final diagnoses:  Syncope, unspecified syncope type  Volume depletion  Diarrhea    6:05 PM Patient is feeling much better after IV fluids.  Emulate in the ER.  No difficulty.  Suspect volume depletion from his diarrhea.  Discharge home in good condition.  PCP follow-up    Hoy Morn, MD 03/10/14 1806

## 2014-03-10 NOTE — ED Notes (Signed)
Patient was at home when wife witness patient have a LOC. Patient family states he was jerking and vomiting while the LOC. Patient states he does not remember anything. Patient has vomiting x3 D x5 in the last 24 hours. Patient received 4 of Zofran with EMS and has a 20 L Hand.

## 2014-03-17 ENCOUNTER — Other Ambulatory Visit: Payer: Self-pay | Admitting: Cardiovascular Disease

## 2014-03-18 NOTE — Telephone Encounter (Signed)
**  Patient needs to call our office to schedule an appointment for further refills. [phone]**

## 2014-04-02 DIAGNOSIS — H02831 Dermatochalasis of right upper eyelid: Secondary | ICD-10-CM | POA: Diagnosis not present

## 2014-04-02 DIAGNOSIS — H43812 Vitreous degeneration, left eye: Secondary | ICD-10-CM | POA: Diagnosis not present

## 2014-04-02 DIAGNOSIS — D3132 Benign neoplasm of left choroid: Secondary | ICD-10-CM | POA: Diagnosis not present

## 2014-04-02 DIAGNOSIS — H25813 Combined forms of age-related cataract, bilateral: Secondary | ICD-10-CM | POA: Diagnosis not present

## 2014-04-02 DIAGNOSIS — H01022 Squamous blepharitis right lower eyelid: Secondary | ICD-10-CM | POA: Diagnosis not present

## 2014-04-02 DIAGNOSIS — H02834 Dermatochalasis of left upper eyelid: Secondary | ICD-10-CM | POA: Diagnosis not present

## 2014-04-02 DIAGNOSIS — H01021 Squamous blepharitis right upper eyelid: Secondary | ICD-10-CM | POA: Diagnosis not present

## 2014-04-02 DIAGNOSIS — H01024 Squamous blepharitis left upper eyelid: Secondary | ICD-10-CM | POA: Diagnosis not present

## 2014-05-08 ENCOUNTER — Other Ambulatory Visit: Payer: Self-pay | Admitting: Pharmacist

## 2014-05-08 ENCOUNTER — Other Ambulatory Visit: Payer: Self-pay

## 2014-05-08 MED ORDER — SIMVASTATIN 40 MG PO TABS: 40.0000 mg | ORAL_TABLET | Freq: Every day | ORAL | Status: DC

## 2014-05-08 MED ORDER — NIACIN ER (ANTIHYPERLIPIDEMIC) 1000 MG PO TBCR: 2000.0000 mg | EXTENDED_RELEASE_TABLET | Freq: Every day | ORAL | Status: DC

## 2014-05-08 MED ORDER — CLOPIDOGREL BISULFATE 75 MG PO TABS
75.0000 mg | ORAL_TABLET | Freq: Every day | ORAL | Status: DC
Start: 2014-05-08 — End: 2014-05-29

## 2014-05-16 ENCOUNTER — Ambulatory Visit (INDEPENDENT_AMBULATORY_CARE_PROVIDER_SITE_OTHER): Payer: Medicare Other

## 2014-05-16 DIAGNOSIS — Z23 Encounter for immunization: Secondary | ICD-10-CM

## 2014-05-20 DIAGNOSIS — M79672 Pain in left foot: Secondary | ICD-10-CM | POA: Diagnosis not present

## 2014-05-20 DIAGNOSIS — M79675 Pain in left toe(s): Secondary | ICD-10-CM | POA: Diagnosis not present

## 2014-05-29 ENCOUNTER — Ambulatory Visit (INDEPENDENT_AMBULATORY_CARE_PROVIDER_SITE_OTHER): Payer: Medicare Other | Admitting: Internal Medicine

## 2014-05-29 ENCOUNTER — Ambulatory Visit (INDEPENDENT_AMBULATORY_CARE_PROVIDER_SITE_OTHER)
Admission: RE | Admit: 2014-05-29 | Discharge: 2014-05-29 | Disposition: A | Discharge status: Home / Self Care | Payer: Medicare Other | Source: Ambulatory Visit | Attending: Internal Medicine | Admitting: Internal Medicine

## 2014-05-29 ENCOUNTER — Other Ambulatory Visit: Payer: Self-pay

## 2014-05-29 ENCOUNTER — Encounter: Payer: Self-pay | Admitting: Internal Medicine

## 2014-05-29 VITALS — BP 134/72 | HR 62 | Temp 97.8°F | Ht 73.0 in | Wt 223.0 lb

## 2014-05-29 DIAGNOSIS — M542 Cervicalgia: Secondary | ICD-10-CM | POA: Insufficient documentation

## 2014-05-29 DIAGNOSIS — M4802 Spinal stenosis, cervical region: Secondary | ICD-10-CM | POA: Diagnosis not present

## 2014-05-29 DIAGNOSIS — I10 Essential (primary) hypertension: Secondary | ICD-10-CM | POA: Diagnosis not present

## 2014-05-29 DIAGNOSIS — M5032 Other cervical disc degeneration, mid-cervical region: Secondary | ICD-10-CM | POA: Diagnosis not present

## 2014-05-29 MED ORDER — CLOPIDOGREL BISULFATE 75 MG PO TABS: 75.0000 mg | ORAL_TABLET | Freq: Every day | ORAL | Status: DC

## 2014-05-29 MED ORDER — MELOXICAM 7.5 MG PO TABS: ORAL_TABLET | ORAL | Status: DC

## 2014-05-29 MED ORDER — TIZANIDINE HCL 4 MG PO TABS: 4.0000 mg | ORAL_TABLET | Freq: Four times a day (QID) | ORAL | Status: DC | PRN

## 2014-05-29 NOTE — Assessment & Plan Note (Signed)
stable overall by history and exam, recent data reviewed with pt, and pt to continue medical treatment as before,  to f/u any worsening symptoms or concerns BP Readings from Last 3 Encounters:  05/29/14 134/72  03/10/14 128/58  11/30/13 131/80

## 2014-05-29 NOTE — Assessment & Plan Note (Signed)
C/w msk strain +/- underlying exac of cervical djd/ddd; for nsaid prn, muscle relaxer prn, c-spine films as have not been done, and  to f/u any worsening symptoms or concerns, consider ortho if not improved 1-2 wks

## 2014-05-29 NOTE — Progress Notes (Signed)
Subjective:    Patient ID: [name] [name], male    DOB: 1940-08-17, 74 y.o.   MRN: [id number]  HPI  Here with acute onset 4 days right neck pain, sharp, mod to severe, improved with laying head to the right on pillow at night, worse to flex head to chest and turning head to right.  Sitting or lying still is better. Tylenol only helps somewhat. Tends to radiate to the right upper back and scapula area.  No bowel or bladder change, fever, wt loss,  worsening UE or LE pain/numbness/weakness, gait change or falls. Past Medical History  Diagnosis Date  . CAD (coronary artery disease)   . MI (myocardial infarction)   . Hyperlipemia   . Hypothyroidism   . Allergic rhinitis   . OSA (obstructive sleep apnea)   . Psoriasis   . Family hx of colon cancer     brother  . Hypertension   . CERUMEN IMPACTION 06/04/2009    5/15     Past Surgical History  Procedure Laterality Date  . Ptca    . Tonsillectomy    . Cardiac catheterization      reports that he has quit smoking. He has never used smokeless tobacco. He reports that he drinks about 3.6 oz of alcohol per week. He reports that he does not use illicit drugs. family history includes COPD in his father; Cancer in his brother and brother; Colon cancer in his brother, maternal aunt, maternal aunt, maternal uncle, paternal aunt, paternal grandfather, and another family member; Colon polyps in his sister; Coronary artery disease in his mother; Heart disease in his mother; Hypertension in an other family member. No Known Allergies Current Outpatient Prescriptions on File Prior to Visit  Medication Sig Dispense Refill  . aspirin 81 MG tablet Take 81 mg by mouth daily.      . clopidogrel (PLAVIX) 75 MG tablet Take 1 tablet (75 mg total) by mouth daily. 30 tablet 6  . levothyroxine (SYNTHROID, LEVOTHROID) 100 MCG tablet TAKE 1 TABLET DAILY 90 tablet 3  . losartan (COZAAR) 100 MG tablet TAKE 1 TABLET EVERY DAY 90 tablet 3  . metoprolol succinate  (TOPROL-XL) 25 MG 24 hr tablet TAKE 1 TABLET DAILY 90 tablet 3  . Multiple Vitamins-Minerals (CENTRUM SILVER PO) Take 1 tablet by mouth daily.      . niacin (NIASPAN) 1000 MG CR tablet Take 2 tablets (2,000 mg total) by mouth at bedtime. 180 tablet 1  . nitroGLYCERIN (NITROSTAT) 0.4 MG SL tablet Place 1 tablet (0.4 mg total) under the tongue every 5 (five) minutes as needed for chest pain. 25 tablet 3  . Omega-3 Fatty Acids (FISH OIL) 1200 MG CAPS Take 1 capsule by mouth daily.     . ondansetron (ZOFRAN ODT) 8 MG disintegrating tablet Take 1 tablet (8 mg total) by mouth every 8 (eight) hours as needed for nausea or vomiting. 10 tablet 0  . simvastatin (ZOCOR) 40 MG tablet Take 1 tablet (40 mg total) by mouth at bedtime. 90 tablet 1   No current facility-administered medications on file prior to visit.   Review of Systems All otherwise neg per pt     Objective:   Physical Exam BP 134/72 mmHg  Pulse 62  Temp(Src) 97.8 F (36.6 C) (Oral)  Ht 6\' 1"  (1.854 m)  Wt 223 lb (101.152 kg)  BMI 29.43 kg/m2  SpO2 97% VS noted,  Constitutional: Pt appears well-developed, well-nourished.  HENT: Head: NCAT.  Right Ear: External ear  normal.  Left Ear: External ear normal.  Eyes: . Pupils are equal, round, and reactive to light. Conjunctivae and EOM are normal Neck: Normal range of motion. Neck supple.  Cardiovascular: Normal rate and regular rhythm.   Pulmonary/Chest: Effort normal and breath sounds without rales or wheezing.  Neurological: Pt is alert. Not confused , motor intact 5/5, sens/dtr intact to UE's Skin: Skin is warm. No rash Psychiatric: Pt behavior is normal. No agitation.  Spine: nontender, does have some right paracervical tender about c3-4 with tender right trapezoid area as well    Assessment & Plan:

## 2014-05-29 NOTE — Patient Instructions (Signed)
Please take all new medication as prescribed - the anti-inflammatory, and muscle relaxer as well if needed  Please continue all other medications as before, and refills have been done if requested.  Please have the pharmacy call with any other refills you may need.  Please keep your appointments with your specialists as you may have planned  Please go to the XRAY Department in the Basement (go straight as you get off the elevator) for the x-ray testing  You will be contacted by phone if any changes need to be made immediately.  Otherwise, you will receive a letter about your results with an explanation, but please check with MyChart first.  Please remember to sign up for MyChart if you have not done so, as this will be important to you in the future with finding out test results, communicating by private email, and scheduling acute appointments online when needed.

## 2014-06-18 NOTE — Progress Notes (Signed)
Patient ID: [name] [name], male   DOB: [birth year], 74 y.o.   MRN: [id number] [name] [name] is  74 y.o. previously seen by Dr Olevia Perches. [name] He has documented coronary disease and has chronic occlusion of the right coronary had cutting balloon angioplasty to the diagonal branch of the LAD. This occluded in 2008. F/U cath for abnormal ETT 11/11 showed occluded RCA and D1 with 70% mid LAD that Dr Olevia Perches decided to manage medically.  He has been doing fairly well  Myovue 04/12/12 low risk  Overall Impression: There is mild ischemia at the base and mid inferior wall. There is normal wall motion. This is a low risk scan.  LV Ejection Fraction: 69%. LV Wall Motion: Normal Wall Motion.  Mild exertional dyspnea BP seems up but he is not taking BP pill that Dr Laurian Brim suggested   ETT 05/20/13 with 10 mintues exercise and positive ECG However he has no angina and had specific symptoms of throat tightness and bad indigestion  Before all his interventions. 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  All other systems reviewed and negative  General: Affect appropriate Healthy:  appears stated age 74: normal Neck supple with no adenopathy JVP normal no bruits no thyromegaly Lungs clear with no wheezing and good diaphragmatic motion Heart:  S1/S2 no murmur, no rub, gallop or click PMI normal Abdomen: benighn, BS positve, no tenderness, no AAA no bruit.  No HSM or HJR Distal pulses intact with no bruits No edema Neuro non-focal Skin warm and dry No muscular weakness   Current Outpatient Prescriptions  Medication Sig Dispense Refill  . aspirin 81 MG tablet Take 81 mg by mouth daily.      .  clopidogrel (PLAVIX) 75 MG tablet Take 1 tablet (75 mg total) by mouth daily. 90 tablet 0  . levothyroxine (SYNTHROID, LEVOTHROID) 100 MCG tablet TAKE 1 TABLET DAILY 90 tablet 3  . losartan (COZAAR) 100 MG tablet TAKE 1 TABLET EVERY DAY 90 tablet 3  . meloxicam (MOBIC) 7.5 MG tablet 1 tab by mouth twice per day as needed 60 tablet 1  . metoprolol succinate (TOPROL-XL) 25 MG 24 hr tablet TAKE 1 TABLET DAILY 90 tablet 3  . Multiple Vitamins-Minerals (CENTRUM SILVER PO) Take 1 tablet by mouth daily.      . niacin (NIASPAN) 1000 MG CR tablet Take 2 tablets (2,000 mg total) by mouth at bedtime. 180 tablet 1  . nitroGLYCERIN (NITROSTAT) 0.4 MG SL tablet Place 1 tablet (0.4 mg total) under the tongue every 5 (five) minutes as needed for chest pain. 25 tablet 3  . Omega-3 Fatty Acids (FISH OIL) 1200 MG CAPS Take 1 capsule by mouth daily.     . ondansetron (ZOFRAN ODT) 8 MG disintegrating tablet Take 1 tablet (8 mg total) by mouth every 8 (eight) hours as needed for nausea or vomiting. 10 tablet 0  . simvastatin (ZOCOR) 40 MG tablet Take 1 tablet (40 mg total) by mouth at bedtime. 90 tablet 1  . tiZANidine (ZANAFLEX) 4 MG tablet Take 1 tablet (4 mg total) by mouth every 6 (six) hours as needed for muscle spasms. 60 tablet 0   No current facility-administered medications for  this visit.    Allergies  Review of patient's allergies indicates no known allergies.  Electrocardiogram:  SR LAFB RBBB  10/09/13    Assessment and Plan

## 2014-06-19 ENCOUNTER — Encounter: Payer: Self-pay | Admitting: Cardiovascular Disease

## 2014-06-19 ENCOUNTER — Ambulatory Visit (INDEPENDENT_AMBULATORY_CARE_PROVIDER_SITE_OTHER): Payer: Medicare Other | Admitting: Cardiovascular Disease

## 2014-06-19 VITALS — BP 146/68 | HR 64 | Ht 73.0 in | Wt 219.0 lb

## 2014-06-19 DIAGNOSIS — E785 Hyperlipidemia, unspecified: Secondary | ICD-10-CM | POA: Diagnosis not present

## 2014-06-19 DIAGNOSIS — I10 Essential (primary) hypertension: Secondary | ICD-10-CM | POA: Diagnosis not present

## 2014-06-19 DIAGNOSIS — I2510 Atherosclerotic heart disease of native coronary artery without angina pectoris: Secondary | ICD-10-CM

## 2014-06-19 NOTE — Assessment & Plan Note (Signed)
Active with no angina Declined cath last year after positive ETT Suspect he is well collateralized  Continue medical Rx  If he developes angina would cath and bypass stress testing

## 2014-06-19 NOTE — Patient Instructions (Signed)
Your physician wants you to follow-up in:  6 MONTHS WITH DR NISHAN  You will receive a reminder letter in the mail two months in advance. If you don't receive a letter, please call our office to schedule the follow-up appointment. Your physician recommends that you continue on your current medications as directed. Please refer to the Current Medication list given to you today. 

## 2014-06-19 NOTE — Assessment & Plan Note (Signed)
Well controlled.  Continue current medications and low sodium Dash type diet.    

## 2014-06-19 NOTE — Assessment & Plan Note (Signed)
Cholesterol is at goal.  Continue current dose of statin and diet Rx.  No myalgias or side effects.  F/U  LFT's in 6 months. Lab Results  Component Value Date   LDLCALC 74 08/28/2013

## 2014-06-20 ENCOUNTER — Ambulatory Visit: Payer: Medicare Other | Admitting: Cardiovascular Disease

## 2014-09-03 DIAGNOSIS — D225 Melanocytic nevi of trunk: Secondary | ICD-10-CM | POA: Diagnosis not present

## 2014-09-03 DIAGNOSIS — L820 Inflamed seborrheic keratosis: Secondary | ICD-10-CM | POA: Diagnosis not present

## 2014-09-16 ENCOUNTER — Ambulatory Visit (INDEPENDENT_AMBULATORY_CARE_PROVIDER_SITE_OTHER): Payer: Medicare Other | Admitting: Internal Medicine

## 2014-09-16 ENCOUNTER — Encounter: Payer: Self-pay | Admitting: Internal Medicine

## 2014-09-16 VITALS — BP 118/56 | HR 69 | Temp 98.0°F | Resp 14 | Wt 220.1 lb

## 2014-09-16 DIAGNOSIS — J209 Acute bronchitis, unspecified: Secondary | ICD-10-CM

## 2014-09-16 DIAGNOSIS — I2510 Atherosclerotic heart disease of native coronary artery without angina pectoris: Secondary | ICD-10-CM | POA: Diagnosis not present

## 2014-09-16 MED ORDER — AMOXICILLIN 500 MG PO CAPS
500.0000 mg | ORAL_CAPSULE | Freq: Three times a day (TID) | ORAL | Status: DC
Start: 2014-09-16 — End: 2014-10-08

## 2014-09-16 NOTE — Progress Notes (Signed)
   Subjective:    Patient ID: [name] [name], male    DOB: 06-07-1940, 74 y.o.   MRN: [id number]  HPI  His symptoms began 09/13/14 as an earache on the right which resolved within 24 hours. Subsequently he developed frontal sinus discomfort as well as mild maxillary sinus discomfort. He does use a Neomed nasal flush. He also used some eardrops for the initial ear pain. He has been taking Tylenol 2 every 6 hours for the frontal headache. He has had postnasal drainage. He describes some sore throat in the morning. Over the weekend he developed chest congestion with yellow sputum production.  Review of Systems He denies itchy, watery eyes or sneezing. He has no fever, chills, or sweats. He denies any nasal purulence.    Objective:   Physical Exam  General appearance:Adequately nourished; no acute distress or increased work of breathing is present.  Pattern alopecia.  Lymphatic: No  lymphadenopathy about the head, neck, or axilla .  Eyes: No conjunctival inflammation or lid edema is present. There is no scleral icterus.  Ears:  External ear exam shows no significant lesions or deformities.  Otoscopic examination reveals clear canals, tympanic membranes are intact bilaterally without bulging, retraction, inflammation or discharge.  Nose:  External nasal examination shows no deformity or inflammation. Nasal mucosa are erythematous ,especially on the left without lesions or exudates No septal dislocation or deviation.No obstruction to airflow.   Oral exam: Dental hygiene is good; lips and gums are healthy appearing.There is no oropharyngeal erythema or exudate .  Neck:  No deformities, thyromegaly, masses, or tenderness noted.   Supple with full range of motion without pain.   Heart:  Normal rate and regular rhythm. S1 and S2 normal without gallop, murmur, click, rub or other extra sounds.   Lungs:Chest clear to auscultation; no wheezes, rhonchi,rales ,or rubs present.  Extremities:  No  cyanosis, edema, or clubbing  noted    Skin: Warm & dry w/o tenting or jaundice. No significant lesions or rash.      Assessment & Plan:  #1 acute bronchitis w/o bronchospasm #2 URI, acute Plan: See orders and recommendations

## 2014-09-16 NOTE — Progress Notes (Signed)
Pre visit review using our clinic review tool, if applicable. No additional management support is needed unless otherwise documented below in the visit note. 

## 2014-09-16 NOTE — Patient Instructions (Signed)

## 2014-09-27 ENCOUNTER — Other Ambulatory Visit: Payer: Self-pay | Admitting: Cardiovascular Disease

## 2014-10-08 ENCOUNTER — Other Ambulatory Visit (INDEPENDENT_AMBULATORY_CARE_PROVIDER_SITE_OTHER): Payer: Medicare Other

## 2014-10-08 ENCOUNTER — Ambulatory Visit (INDEPENDENT_AMBULATORY_CARE_PROVIDER_SITE_OTHER): Payer: Medicare Other | Admitting: Internal Medicine

## 2014-10-08 ENCOUNTER — Encounter: Payer: Self-pay | Admitting: Internal Medicine

## 2014-10-08 VITALS — BP 130/68 | HR 63 | Temp 97.7°F | Ht 73.0 in | Wt 218.2 lb

## 2014-10-08 DIAGNOSIS — Z0000 Encounter for general adult medical examination without abnormal findings: Secondary | ICD-10-CM | POA: Diagnosis not present

## 2014-10-08 DIAGNOSIS — E034 Atrophy of thyroid (acquired): Secondary | ICD-10-CM

## 2014-10-08 DIAGNOSIS — I252 Old myocardial infarction: Secondary | ICD-10-CM

## 2014-10-08 DIAGNOSIS — I10 Essential (primary) hypertension: Secondary | ICD-10-CM

## 2014-10-08 DIAGNOSIS — E038 Other specified hypothyroidism: Secondary | ICD-10-CM

## 2014-10-08 DIAGNOSIS — I2510 Atherosclerotic heart disease of native coronary artery without angina pectoris: Secondary | ICD-10-CM

## 2014-10-08 DIAGNOSIS — N320 Bladder-neck obstruction: Secondary | ICD-10-CM

## 2014-10-08 DIAGNOSIS — E785 Hyperlipidemia, unspecified: Secondary | ICD-10-CM

## 2014-10-08 LAB — BASIC METABOLIC PANEL
BUN: 12 mg/dL (ref 6–23)
CO2: 27 meq/L (ref 19–32)
CREATININE: 0.86 mg/dL (ref 0.40–1.50)
Calcium: 9.4 mg/dL (ref 8.4–10.5)
Chloride: 105 mEq/L (ref 96–112)
GFR: 92.52 mL/min (ref >60.00)
Glucose, Bld: 95 mg/dL (ref 70–99)
Potassium: 3.9 mEq/L (ref 3.5–5.1)
SODIUM: 139 meq/L (ref 135–145)

## 2014-10-08 LAB — CBC WITH DIFFERENTIAL/PLATELET
Basophils Absolute: 0 10*3/uL (ref 0.0–0.1)
Basophils Relative: 0.4 % (ref 0.0–3.0)
Eosinophils Absolute: 0.4 10*3/uL (ref 0.0–0.7)
Eosinophils Relative: 6.5 % — ABNORMAL HIGH (ref 0.0–5.0)
HCT: 41.4 % (ref 39.0–52.0)
Hemoglobin: 14 g/dL (ref 13.0–17.0)
LYMPHS PCT: 32.8 % (ref 12.0–46.0)
Lymphs Abs: 2.1 10*3/uL (ref 0.7–4.0)
MCHC: 33.8 g/dL (ref 30.0–36.0)
MCV: 89.5 fl (ref 78.0–100.0)
Monocytes Absolute: 0.6 10*3/uL (ref 0.1–1.0)
Monocytes Relative: 8.7 % (ref 3.0–12.0)
NEUTROS PCT: 51.6 % (ref 43.0–77.0)
Neutro Abs: 3.4 10*3/uL (ref 1.4–7.7)
PLATELETS: 225 10*3/uL (ref 150.0–400.0)
RBC: 4.62 Mil/uL (ref 4.22–5.81)
RDW: 13.7 % (ref 11.5–15.5)
WBC: 6.5 10*3/uL (ref 4.0–10.5)

## 2014-10-08 LAB — TSH: TSH: 3.3 u[IU]/mL (ref 0.35–4.50)

## 2014-10-08 LAB — URINALYSIS, ROUTINE W REFLEX MICROSCOPIC
BILIRUBIN URINE: NEGATIVE
HGB URINE DIPSTICK: NEGATIVE
Ketones, ur: NEGATIVE
NITRITE: NEGATIVE
PH: 6 (ref 5.0–8.0)
Specific Gravity, Urine: 1.015 (ref 1.000–1.030)
Total Protein, Urine: NEGATIVE
Urine Glucose: NEGATIVE
Urobilinogen, UA: 0.2 (ref 0.0–1.0)

## 2014-10-08 LAB — HEPATIC FUNCTION PANEL
ALT: 29 U/L (ref 0–53)
AST: 28 U/L (ref 0–37)
Albumin: 4.4 g/dL (ref 3.5–5.2)
Alkaline Phosphatase: 46 U/L (ref 39–117)
BILIRUBIN DIRECT: 0.2 mg/dL (ref 0.0–0.3)
BILIRUBIN TOTAL: 1 mg/dL (ref 0.2–1.2)
Total Protein: 6.7 g/dL (ref 6.0–8.3)

## 2014-10-08 LAB — LIPID PANEL
CHOLESTEROL: 145 mg/dL (ref 0–200)
HDL: 42.5 mg/dL (ref >39.00)
LDL CALC: 81 mg/dL (ref 0–99)
NonHDL: 102.5
Total CHOL/HDL Ratio: 3
Triglycerides: 108 mg/dL (ref 0.0–149.0)
VLDL: 21.6 mg/dL (ref 0.0–40.0)

## 2014-10-08 LAB — PSA: PSA: 0.49 ng/mL (ref 0.10–4.00)

## 2014-10-08 MED ORDER — LOSARTAN POTASSIUM 100 MG PO TABS: 100.0000 mg | ORAL_TABLET | Freq: Every day | ORAL | Status: DC

## 2014-10-08 MED ORDER — VITAMIN D3 50 MCG (2000 UT) PO CAPS: 2000.0000 [IU] | ORAL_CAPSULE | Freq: Every day | ORAL | Status: DC

## 2014-10-08 MED ORDER — METOPROLOL SUCCINATE ER 25 MG PO TB24: 25.0000 mg | ORAL_TABLET | Freq: Every day | ORAL | Status: DC

## 2014-10-08 NOTE — Assessment & Plan Note (Signed)
Chronic  On Simvastatin 

## 2014-10-08 NOTE — Patient Instructions (Addendum)
[name] [name] , Thank you for taking time to come for your Medicare Wellness Visit. I appreciate your ongoing commitment to your health goals. Please review the following plan we discussed and let me know if I can assist you in the future.   These are the goals we discussed: Goals are to start walking/ jogging; good plan to build up /Agrees to increase exercise; back to walking and jogging; 5 days a week build up to 5 miles per hour. [employer] access;      This is a list of the screening recommended for you and due dates:  Health Maintenance up to date  Reviewed all screens; Cardiologist following CAD Discussed metabolic syndrome; [employer] of health online Colonoscopy due next year Vision checks are annual Dental fup regularly Vaccination; up to date Other preventive screens reviewed      Health Maintenance A healthy lifestyle and preventative care can promote health and wellness.  Maintain regular health, dental, and eye exams.  Eat a healthy diet. Foods like vegetables, fruits, whole grains, low-fat dairy products, and lean protein foods contain the nutrients you need and are low in calories. Decrease your intake of foods high in solid fats, added sugars, and salt. Get information about a proper diet from your health care provider, if necessary.  Regular physical exercise is one of the most important things you can do for your health. Most adults should get at least 150 minutes of moderate-intensity exercise (any activity that increases your heart rate and causes you to sweat) each week. In addition, most adults need muscle-strengthening exercises on 2 or more days a week.   Maintain a healthy weight. The body mass index (BMI) is a screening tool to identify possible weight problems. It provides an estimate of body fat based on height and weight. Your health care provider can find your BMI and can help you achieve or maintain a healthy weight. For males 20 years and  older:  A BMI below 18.5 is considered underweight.  A BMI of 18.5 to 24.9 is normal.  A BMI of 25 to 29.9 is considered overweight.  A BMI of 30 and above is considered obese.  Maintain normal blood lipids and cholesterol by exercising and minimizing your intake of saturated fat. Eat a balanced diet with plenty of fruits and vegetables. Blood tests for lipids and cholesterol should begin at age 72 and be repeated every 5 years. If your lipid or cholesterol levels are high, you are over age 44, or you are at high risk for heart disease, you may need your cholesterol levels checked more frequently.Ongoing high lipid and cholesterol levels should be treated with medicines if diet and exercise are not working.  If you smoke, find out from your health care provider how to quit. If you do not use tobacco, do not start.  Lung cancer screening is recommended for adults aged 71-80 years who are at high risk for developing lung cancer because of a history of smoking. A yearly low-dose CT scan of the lungs is recommended for people who have at least a 30-pack-year history of smoking and are current smokers or have quit within the past 15 years. A pack year of smoking is smoking an average of 1 pack of cigarettes a day for 1 year (for example, a 30-pack-year history of smoking could mean smoking 1 pack a day for 30 years or 2 packs a day for 15 years). Yearly screening should continue until the smoker has stopped smoking for  at least 15 years. Yearly screening should be stopped for people who develop a health problem that would prevent them from having lung cancer treatment.  If you choose to drink alcohol, do not have more than 2 drinks per day. One drink is considered to be 12 oz (360 mL) of beer, 5 oz (150 mL) of wine, or 1.5 oz (45 mL) of liquor.  Avoid the use of street drugs. Do not share needles with anyone. Ask for help if you need support or instructions about stopping the use of drugs.  High  blood pressure causes heart disease and increases the risk of stroke. Blood pressure should be checked at least every 1-2 years. Ongoing high blood pressure should be treated with medicines if weight loss and exercise are not effective.  If you are 57-22 years old, ask your health care provider if you should take aspirin to prevent heart disease.  Diabetes screening involves taking a blood sample to check your fasting blood sugar level. This should be done once every 3 years after age 45 if you are at a normal weight and without risk factors for diabetes. Testing should be considered at a younger age or be carried out more frequently if you are overweight and have at least 1 risk factor for diabetes.  Colorectal cancer can be detected and often prevented. Most routine colorectal cancer screening begins at the age of 56 and continues through age 28. However, your health care provider may recommend screening at an earlier age if you have risk factors for colon cancer. On a yearly basis, your health care provider may provide home test kits to check for hidden blood in the stool. A small camera at the end of a tube may be used to directly examine the colon (sigmoidoscopy or colonoscopy) to detect the earliest forms of colorectal cancer. Talk to your health care provider about this at age 39 when routine screening begins. A direct exam of the colon should be repeated every 5-10 years through age 77, unless early forms of precancerous polyps or small growths are found.  People who are at an increased risk for hepatitis B should be screened for this virus. You are considered at high risk for hepatitis B if:  You were born in a country where hepatitis B occurs often. Talk with your health care provider about which countries are considered high risk.  Your parents were born in a high-risk country and you have not received a shot to protect against hepatitis B (hepatitis B vaccine).  You have HIV or AIDS.  You  use needles to inject street drugs.  You live with, or have sex with, someone who has hepatitis B.  You are a man who has sex with other men (MSM).  You get hemodialysis treatment.  You take certain medicines for conditions like cancer, organ transplantation, and autoimmune conditions.  Hepatitis C blood testing is recommended for all people born from [birth year] through 1965 and any individual with known risk factors for hepatitis C.  Healthy men should no longer receive prostate-specific antigen (PSA) blood tests as part of routine cancer screening. Talk to your health care provider about prostate cancer screening.  Testicular cancer screening is not recommended for adolescents or adult males who have no symptoms. Screening includes self-exam, a health care provider exam, and other screening tests. Consult with your health care provider about any symptoms you have or any concerns you have about testicular cancer.  Practice safe sex. Use condoms and avoid  high-risk sexual practices to reduce the spread of sexually transmitted infections (STIs).  You should be screened for STIs, including gonorrhea and chlamydia if:  You are sexually active and are younger than 24 years.  You are older than 24 years, and your health care provider tells you that you are at risk for this type of infection.  Your sexual activity has changed since you were last screened, and you are at an increased risk for chlamydia or gonorrhea. Ask your health care provider if you are at risk.  If you are at risk of being infected with HIV, it is recommended that you take a prescription medicine daily to prevent HIV infection. This is called pre-exposure prophylaxis (PrEP). You are considered at risk if:  You are a man who has sex with other men (MSM).  You are a heterosexual man who is sexually active with multiple partners.  You take drugs by injection.  You are sexually active with a partner who has HIV.  Talk with  your health care provider about whether you are at high risk of being infected with HIV. If you choose to begin PrEP, you should first be tested for HIV. You should then be tested every 3 months for as long as you are taking PrEP.  Use sunscreen. Apply sunscreen liberally and repeatedly throughout the day. You should seek shade when your shadow is shorter than you. Protect yourself by wearing long sleeves, pants, a wide-brimmed hat, and sunglasses year round whenever you are outdoors.  Tell your health care provider of new moles or changes in moles, especially if there is a change in shape or color. Also, tell your health care provider if a mole is larger than the size of a pencil eraser.  A one-time screening for abdominal aortic aneurysm (AAA) and surgical repair of large AAAs by ultrasound is recommended for men aged 41-75 years who are current or former smokers.  Stay current with your vaccines (immunizations). Document Released: 10/09/2007 Document Revised: 04/17/2013 Document Reviewed: 09/07/2010 [facility] Patient Information 2015 [place], [place]. This information is not intended to replace advice given to you by your health care provider. Make sure you discuss any questions you have with your health care provider.

## 2014-10-08 NOTE — Progress Notes (Signed)
Subjective:    HPI  The patient is here for a wellness exam. The patient has been doing well overall without major physical or psychological issues going on lately except for SBP=140.  C/o R LBP  The patient presents for a follow-up of  chronic hypertension, chronic dyslipidemia, CAD controlled with medicines  BP Readings from Last 3 Encounters:  10/08/14 130/68  09/16/14 118/56  06/19/14 146/68   Wt Readings from Last 3 Encounters:  10/08/14 218 lb 4 oz (98.998 kg)  09/16/14 220 lb 2 oz (99.848 kg)  06/19/14 219 lb (99.338 kg)      Review of Systems  Constitutional: Negative for appetite change, fatigue and unexpected weight change.  HENT: Negative for congestion, nosebleeds, sneezing, sore throat and trouble swallowing.   Eyes: Negative for itching and visual disturbance.  Respiratory: Negative for cough.   Cardiovascular: Negative for chest pain, palpitations and leg swelling.  Gastrointestinal: Negative for nausea, diarrhea, blood in stool and abdominal distention.  Genitourinary: Negative for frequency and hematuria.  Musculoskeletal: Negative for back pain, joint swelling, gait problem and neck pain.  Skin: Negative for rash.  Neurological: Negative for dizziness, tremors, speech difficulty and weakness.  Psychiatric/Behavioral: Negative for sleep disturbance, dysphoric mood and agitation. The patient is not nervous/anxious.        Objective:   Physical Exam  Constitutional: He is oriented to person, place, and time. He appears well-developed and well-nourished. No distress.  HENT:  Head: Normocephalic and atraumatic.  Right Ear: External ear normal.  Left Ear: External ear normal.  Nose: Nose normal.  Mouth/Throat: Oropharynx is clear and moist. No oropharyngeal exudate.  Eyes: Conjunctivae and EOM are normal. Pupils are equal, round, and reactive to light. Right eye exhibits no discharge. Left eye exhibits no discharge. No scleral icterus.  Neck: Normal  range of motion. Neck supple. No JVD present. No tracheal deviation present. No thyromegaly present.  Cardiovascular: Normal rate, regular rhythm, normal heart sounds and intact distal pulses.  Exam reveals no gallop and no friction rub.   No murmur heard. Pulmonary/Chest: Effort normal and breath sounds normal. No stridor. No respiratory distress. He has no wheezes. He has no rales. He exhibits no tenderness.  Abdominal: Soft. Bowel sounds are normal. He exhibits no distension and no mass. There is no tenderness. There is no rebound and no guarding.  Genitourinary: Rectum normal, prostate normal and penis normal. Guaiac negative stool. No penile tenderness.  Musculoskeletal: Normal range of motion. He exhibits no edema or tenderness.  Lymphadenopathy:    He has no cervical adenopathy.  Neurological: He is alert and oriented to person, place, and time. He has normal reflexes. No cranial nerve deficit. He exhibits normal muscle tone. Coordination normal.  Skin: Skin is warm and dry. No rash noted. He is not diaphoretic. No erythema. No pallor.  Psychiatric: He has a normal mood and affect. His behavior is normal. Judgment and thought content normal.  R LS tender B wax L big toe with fungal type changes - seems to be coming off  Lab Results  Component Value Date   WBC 10.6* 03/10/2014   HGB 15.3 03/10/2014   HCT 45.4 03/10/2014   PLT 165 03/10/2014   GLUCOSE 110* 03/10/2014   CHOL 147 08/28/2013   TRIG 108.0 08/28/2013   HDL 51.70 08/28/2013   LDLCALC 74 08/28/2013   ALT 22 03/10/2014   AST 25 03/10/2014   NA 141 03/10/2014   K 4.7 03/10/2014   CL 103 03/10/2014  CREATININE 0.92 03/10/2014   BUN 15 03/10/2014   CO2 24 03/10/2014   TSH 3.13 08/28/2013   PSA 0.56 08/28/2013   INR 1.0 ratio 03/06/2010   HGBA1C 5.8 08/28/2013            Assessment & Plan:  Patient ID: [name] [name], male   DOB: 28-Aug-1940, 74 y.o.   MRN: [id number]

## 2014-10-08 NOTE — Assessment & Plan Note (Addendum)

## 2014-10-08 NOTE — Assessment & Plan Note (Signed)
Metoprolol, Losartan, ASA, Simvastatin 

## 2014-10-08 NOTE — Progress Notes (Signed)
Subjective:   [name] [name] is a 74 y.o. male who presents for Medicare Annual/Subsequent preventive examination.  Review of Systems:   HRA assessment completed during visit;  Patient is here for Annual Wellness Assessment  Hx of CAD and MI: Reviewed risk and discussed metabolic syndrome Discussed calculated risk over time; including good management of BP, meds; exercise, diet, good lipid control; increasing HDL and waist circumference  Diet; no meat very often; when he had MI, he stopped eating steak;  Egg Muffin; soup and salad; tuna or veg subs Wife fixes spaghetti;  Low fat; may monitor sodium  Exercise; haven't done a lot; getting over bronchitis Walk and jog;  4 min walking and 4 jogging / 5 miles [place] to dietician and knows what to eat  BMI 28.8; Educated on metabolic syndrome;  HDL 50 range; Will start exercising and always takes heart rate when walking/ jogging; HR goal 130   Social history: Lives with spouse Son lives in [place]; other son in [place];  [place] children; 3 boys Stress; 1-5; 3 months of year he has 5 and 0 to 1 the rest of the season   Psychosocial support; safe community; firearm safety; smoke alarms;    Discussed Goal to improve health based on risk/ discussed sodium and eating clean  Screenings; Immunizations up to date Colonoscopy; due next year 2017 EKG: Dr. Jenkins [place]   Vision: annually , Dr. Carolynn Sayers Hearing:4000 hz in each ear Dental: periodontist and dental work; Dr. Cynda Familia   Gave information on safety to take home;        Objective:    Vitals: BP 130/68 mmHg  Pulse 63  Temp(Src) 97.7 F (36.5 C) (Oral)  Ht 6\' 1"  (1.854 m)  Wt 218 lb 4 oz (98.998 kg)  BMI 28.80 kg/m2  SpO2 97%  Tobacco History  Smoking status  . Former Smoker  Smokeless tobacco  . Never Used     Counseling given: Not Answered   Past Medical History  Diagnosis Date  . CAD (coronary artery disease)   . MI (myocardial infarction)   .  Hyperlipemia   . Hypothyroidism   . Allergic rhinitis   . OSA (obstructive sleep apnea)   . Psoriasis   . Family hx of colon cancer     brother  . Hypertension   . CERUMEN IMPACTION 06/04/2009    5/15     Past Surgical History  Procedure Laterality Date  . Ptca    . Tonsillectomy    . Cardiac catheterization     Family History  Problem Relation Age of Onset  . Hypertension    . Colon cancer    . Colon cancer Brother     died 71  . Cancer Brother     bone cancer  . Coronary artery disease Mother   . Heart disease Mother   . Colon polyps Sister     x2  . COPD Father   . Colon cancer Maternal Aunt   . Colon cancer Maternal Uncle   . Colon cancer Maternal Aunt   . Colon cancer Paternal Aunt   . Colon cancer Paternal Grandfather   . Cancer Brother     colon cancer   History  Sexual Activity  . Sexual Activity: Not Currently    Outpatient Encounter Prescriptions as of 10/08/2014  Medication Sig  . aspirin 81 MG tablet Take 81 mg by mouth daily.    . clopidogrel (PLAVIX) 75 MG tablet TAKE 1 TABLET  DAILY  . levothyroxine (SYNTHROID, LEVOTHROID) 100 MCG tablet TAKE 1 TABLET DAILY  . losartan (COZAAR) 100 MG tablet Take 1 tablet (100 mg total) by mouth daily.  . metoprolol succinate (TOPROL-XL) 25 MG 24 hr tablet Take 1 tablet (25 mg total) by mouth daily.  . Multiple Vitamins-Minerals (CENTRUM SILVER PO) Take 1 tablet by mouth daily.    . niacin (NIASPAN) 1000 MG CR tablet Take 2 tablets (2,000 mg total) by mouth at bedtime.  . nitroGLYCERIN (NITROSTAT) 0.4 MG SL tablet Place 1 tablet (0.4 mg total) under the tongue every 5 (five) minutes as needed for chest pain.  . Omega-3 Fatty Acids (FISH OIL) 1200 MG CAPS Take 1 capsule by mouth daily.   . simvastatin (ZOCOR) 40 MG tablet Take 1 tablet (40 mg total) by mouth at bedtime.  . [DISCONTINUED] losartan (COZAAR) 100 MG tablet TAKE 1 TABLET EVERY DAY  . [DISCONTINUED] metoprolol succinate (TOPROL-XL) 25 MG 24 hr tablet TAKE 1  TABLET DAILY  . Cholecalciferol (VITAMIN D3) 2000 UNITS capsule Take 1 capsule (2,000 Units total) by mouth daily.  . [DISCONTINUED] amoxicillin (AMOXIL) 500 MG capsule Take 1 capsule (500 mg total) by mouth 3 (three) times daily.   No facility-administered encounter medications on file as of 10/08/2014.    Activities of Daily Living In your present state of health, do you have any difficulty performing the following activities: 10/08/2014  Hearing? N  Vision? N  Difficulty concentrating or making decisions? N  Walking or climbing stairs? N  Dressing or bathing? N  Doing errands, shopping? N    Patient Care Team: Cassandria Anger, MD as PCP - General Josue Hector, MD (Cardiology) Sable Feil, MD as Attending Physician (Gastroenterology) Clent Jacks, MD (Ophthalmology)   Assessment:    Objective:  Pt determined a personalized goal; see patient goals;  Assessment included: Taking meds without issues; no barriers identified Labs were and fup visit noted with MD if labs are due to be re-drawn. Stress: Recommendations for managing stress if assessed as a factor;  No Risk for hepatitis or high risk social behavior identified via hepatitis screen  Educated on Vaccines;  Safety issues reviewed; Cognition assessed by AD8; Score 0 MMSE deferred as the patient stated they had no memory issues; No identified risk were noted; The patient was oriented x 3; appropriate in dress and manner and no objective failures at ADL's or IADL's.     Vaccine status; up to date  Colonoscopy due in 2017 Eye exam annually dr. Carolynn Sayers Hearing; no issues at present Dental; regular visit with dentist and peridontist        Exercise Activities and Dietary recommendations    Goals    . Exercise 3x per week (30 min per time)     Agrees to increase exercise; back to walking and jogging; 5 days a week build up to 5 miles per hour.       Fall Risk Fall Risk  10/08/2014  Falls in the  past year? No   Depression Screen PHQ 2/9 Scores 10/08/2014  PHQ - 2 Score 0    Cognitive Testing No flowsheet data found.  Immunization History  Administered Date(s) Administered  . Influenza Whole 04/14/2007, 06/05/2010  . Influenza,inj,Quad PF,36+ Mos 05/16/2014  . Pneumococcal Conjugate-13 08/28/2013  . Pneumococcal Polysaccharide-23 06/04/2009  . Td 08/16/2011  . Zoster 06/05/2010   Screening Tests Health Maintenance  Topic Date Due  . INFLUENZA VACCINE  11/25/2014  . COLONOSCOPY  08/26/2015  .  TETANUS/TDAP  08/15/2021  . ZOSTAVAX  Completed  . PNA vac Low Risk Adult  Completed      Plan:    Plan   The patient agrees [id number] exercising and monitor diet  Advanced directive: Has information at home but has not completed yet    During the course of the visit the patient was educated and counseled about the following appropriate screening and preventive services:   Vaccines to include Pneumoccal, Influenza, Hepatitis B, Td, Zostavax, HCV/ up to date  Electrocardiogram; deferred to cardiologist  Cardiovascular Disease; discussed lifestyle; lipids and metabolic syndrome  Colorectal cancer screening; due 2017  Diabetes screening na/  Prostate Cancer Screening deferred t omd  Glaucoma screening; q year  Nutrition counseling / hx of going to [facility] [place] [facility] and will start to do some of these things again    Patient Instructions (the written plan) was given to the patient.    Wynetta Fines, RN  10/08/2014

## 2014-10-08 NOTE — Assessment & Plan Note (Signed)
On Levothyroxine 

## 2014-10-08 NOTE — Progress Notes (Signed)
Pre visit review using our clinic review tool, if applicable. No additional management support is needed unless otherwise documented below in the visit note. 

## 2014-10-08 NOTE — Assessment & Plan Note (Signed)
Metoprolol, Losartan 

## 2014-10-10 DIAGNOSIS — M2040 Other hammer toe(s) (acquired), unspecified foot: Secondary | ICD-10-CM | POA: Diagnosis not present

## 2014-10-10 DIAGNOSIS — M2041 Other hammer toe(s) (acquired), right foot: Secondary | ICD-10-CM | POA: Diagnosis not present

## 2014-10-23 ENCOUNTER — Encounter: Payer: Self-pay | Admitting: Family Medicine

## 2014-10-23 ENCOUNTER — Ambulatory Visit (INDEPENDENT_AMBULATORY_CARE_PROVIDER_SITE_OTHER): Payer: Medicare Other | Admitting: Family Medicine

## 2014-10-23 ENCOUNTER — Other Ambulatory Visit (INDEPENDENT_AMBULATORY_CARE_PROVIDER_SITE_OTHER): Payer: Medicare Other

## 2014-10-23 VITALS — BP 110/72 | HR 62 | Ht 73.0 in | Wt 219.0 lb

## 2014-10-23 DIAGNOSIS — M6740 Ganglion, unspecified site: Secondary | ICD-10-CM

## 2014-10-23 DIAGNOSIS — M79642 Pain in left hand: Secondary | ICD-10-CM

## 2014-10-23 DIAGNOSIS — I2510 Atherosclerotic heart disease of native coronary artery without angina pectoris: Secondary | ICD-10-CM

## 2014-10-23 NOTE — Patient Instructions (Addendum)
Good to see you.  Ice 20 minutes 2 times daily. Usually after activity and before bed. Be sure to ice later today as it may get sore See me again in 2-3weeks to re-evaluate

## 2014-10-23 NOTE — Progress Notes (Signed)
[name] [address], [place] [address] Phone: [phone] Subjective:    I'm seeing this patient by the request  of:  Walker Kehr, MD   CC: left hand mass  EXB:[id number] [name] [name] is a 74 y.o. male coming in with complaint of left hand mass. Patient describes the pain is nonexistent. Patient is has had this area for multiple years probably going on 20 years. Patient has not number any injury. Does not cause him any pain. No weakness. No numbness. Patient states though that it has changed in size recently. Seems to getting somewhat larger. Patient is here for further evaluation.  Past Medical History  Diagnosis Date  . CAD (coronary artery disease)   . MI (myocardial infarction)   . Hyperlipemia   . Hypothyroidism   . Allergic rhinitis   . OSA (obstructive sleep apnea)   . Psoriasis   . Family hx of colon cancer     brother  . Hypertension   . CERUMEN IMPACTION 06/04/2009    5/15     Past Surgical History  Procedure Laterality Date  . Ptca    . Tonsillectomy    . Cardiac catheterization     History  Substance Use Topics  . Smoking status: Former Research scientist (life sciences)  . Smokeless tobacco: Never Used  . Alcohol Use: 3.6 oz/week    6 Cans of beer per week   No Known Allergies Family History  Problem Relation Age of Onset  . Hypertension    . Colon cancer    . Colon cancer Brother     died 66  . Cancer Brother     bone cancer  . Coronary artery disease Mother   . Heart disease Mother   . Colon polyps Sister     x2  . COPD Father   . Colon cancer Maternal Aunt   . Colon cancer Maternal Uncle   . Colon cancer Maternal Aunt   . Colon cancer Paternal Aunt   . Colon cancer Paternal Grandfather   . Cancer Brother     colon cancer    Previous imaging back in 2015 show that patient did have a small cyst in the distal third metacarpal region. Seemed to be benign and this was reviewed by me.    Past medical history, social,  surgical and family history all reviewed in electronic medical record.   Review of Systems: No headache, visual changes, nausea, vomiting, diarrhea, constipation, dizziness, abdominal pain, skin rash, fevers, chills, night sweats, weight loss, swollen lymph nodes, body aches, joint swelling, muscle aches, chest pain, shortness of breath, mood changes.   Objective Blood pressure 110/72, pulse 62, height 6\' 1"  (1.854 m), weight 219 lb (99.338 kg), SpO2 97 %.  General: No apparent distress alert and oriented x3 mood and affect normal, dressed appropriately.  HEENT: Pupils equal, extraocular movements intact  Respiratory: Patient's speak in full sentences and does not appear short of breath  Cardiovascular: No lower extremity edema, non tender, no erythema  Skin: Warm dry intact with no signs of infection or rash on extremities or on axial skeleton.  Abdomen: Soft nontender  Neuro: Cranial nerves II through XII are intact, neurovascularly intact in all extremities with 2+ DTRs and 2+ pulses.  Lymph: No lymphadenopathy of posterior or anterior cervical chain or axillae bilaterally.  Gait normal with good balance and coordination.  MSK:  Non tender with full range of motion and good stability and symmetric strength and tone  of shoulders, elbows, hip, knee and ankles bilaterally.  Wrist: left Inspection normal with no visible erythema or swelling. ROM smooth and normal with good flexion and extension and ulnar/radial deviation that is symmetrical with opposite wrist. Palpation is normal over metacarpals, navicular, lunate, and TFCC; tendons without tenderness/ swelling No snuffbox tenderness. No tenderness over Canal of Guyon. Strength 5/5 in all directions without pain. Negative Finkelstein, tinel's and phalens. Negative Watson's test.  Patient's palmar aspect over the third metatarsal does have an area that seems to be cystic in nature.  MSK US performed of: left This study was ordered,  performed, and interpreted by Charlann Boxer D.O.  Wrist: All extensor compartments visualized and tendons all normal in appearance without fraying, tears, or sheath effusions. No effusion seen. TFCC intact. Scapholunate ligament intact. Lexer tendon seems to be unremarkable with the patient does have a cyst just outside the tendon sheath. Seems to be heterotropic in nature. Mild increase in Doppler flow surrounding the area but does not appear to be vascularized. Carpal tunnel visualized and median nerve area normal,  Power doppler signal normal.  IMPRESSION:  Palmar aspect ganglion cyst over the third flexor tendon.  Procedure: Real-time Ultrasound Guided Injection of third metatarsal cyst  Device: GE Logiq E  Ultrasound guided injection is preferred based studies that show increased duration, increased effect, greater accuracy, decreased procedural pain, increased response rate, and decreased cost with ultrasound guided versus blind injection.  Verbal informed consent obtained.  Time-out conducted.  Noted no overlying erythema, induration, or other signs of local infection.  Skin prepped in a sterile fashion.  Local anesthesia: Topical Ethyl chloride.  With sterile technique and under real time ultrasound guidance:  With a 25-gauge 1 inch needle patient was injected with 0.5 mL of 0.5% Marcaine. Patient then had mild aspiration of what seemed to be jellylike fluid. This is consistent with a ganglion cyst. Patient was injected with 0.5 mL of Kenalog 49 g/dL. Completed without difficulty  Pain immediately resolved suggesting accurate placement of the medication.  Advised to call if fevers/chills, erythema, induration, drainage, or persistent bleeding.  Images permanently stored and available for review in the ultrasound unit.  Impression: Technically successful ultrasound guided injection.   Impression and Recommendations:     This case required medical decision making of moderate  complexity.

## 2014-10-23 NOTE — Assessment & Plan Note (Signed)
Patient was given an injection today and tolerated the procedure very well. We discussed icing regimen over the course of next 6 hours. It did appear that this ganglion cyst that was extra tendon sheath. Patient does have a family history of his brother having a bone cancer that started in the hand which weeks patient little bit concerned. Patient come back to in 2-3 weeks for further evaluation. If patient has any significant enlargement of this area or any pain or any systemic illnesses that seems to correspond we will consider further workup. I think this is very low yield for working up at this time but we will keep close eye patient will come back in 2 weeks.

## 2014-10-23 NOTE — Progress Notes (Signed)
Pre visit review using our clinic review tool, if applicable. No additional management support is needed unless otherwise documented below in the visit note. 

## 2014-11-06 ENCOUNTER — Encounter: Payer: Self-pay | Admitting: Family Medicine

## 2014-12-04 ENCOUNTER — Other Ambulatory Visit: Payer: Self-pay | Admitting: Cardiovascular Disease

## 2014-12-04 ENCOUNTER — Other Ambulatory Visit: Payer: Self-pay | Admitting: Internal Medicine

## 2015-01-03 ENCOUNTER — Other Ambulatory Visit: Payer: Self-pay | Admitting: Cardiovascular Disease

## 2015-01-15 ENCOUNTER — Encounter: Payer: Self-pay | Admitting: Family

## 2015-01-15 ENCOUNTER — Ambulatory Visit (INDEPENDENT_AMBULATORY_CARE_PROVIDER_SITE_OTHER): Payer: BLUE CROSS/BLUE SHIELD | Admitting: Family

## 2015-01-15 VITALS — BP 118/74 | HR 55 | Temp 98.1°F | Resp 18 | Ht 73.0 in | Wt 206.4 lb

## 2015-01-15 DIAGNOSIS — J0100 Acute maxillary sinusitis, unspecified: Secondary | ICD-10-CM

## 2015-01-15 DIAGNOSIS — I2510 Atherosclerotic heart disease of native coronary artery without angina pectoris: Secondary | ICD-10-CM

## 2015-01-15 MED ORDER — AMOXICILLIN-POT CLAVULANATE 875-125 MG PO TABS: 1.0000 | ORAL_TABLET | Freq: Two times a day (BID) | ORAL | Status: DC

## 2015-01-15 NOTE — Patient Instructions (Signed)
Thank you for choosing [facility].  Summary/Instructions:  Your prescription(s) have been submitted to your pharmacy or been printed and provided for you. Please take as directed and contact our office if you believe you are having problem(s) with the medication(s) or have any questions.  If your symptoms worsen or fail to improve, please contact our office for further instruction, or in case of emergency go directly to the emergency room at the closest medical facility.   General Recommendations:    Please drink plenty of fluids.  Get plenty of rest   Sleep in humidified air  Use saline nasal sprays  Netti pot   OTC Medications:  Decongestants - helps relieve congestion   Flonase (generic fluticasone) or Nasacort (generic triamcinolone) - please make sure to use the "cross-over" technique at a 45 degree angle towards the opposite eye as opposed to straight up the nasal passageway.   Sudafed (generic pseudoephedrine - Note this is the one that is available behind the pharmacy counter); Products with phenylephrine (-PE) may also be used but is often not as effective as pseudoephedrine.   If you have HIGH BLOOD PRESSURE - Coricidin HBP; AVOID any product that is -D as this contains pseudoephedrine which may increase your blood pressure.  Afrin (oxymetazoline) every 6-8 hours for up to 3 days.   Allergies - helps relieve runny nose, itchy eyes and sneezing   Claritin (generic loratidine), Allegra (fexofenidine), or Zyrtec (generic cyrterizine) for runny nose. These medications should not cause drowsiness.  Note - Benadryl (generic diphenhydramine) may be used however may cause drowsiness  Cough -   Delsym or Robitussin (generic dextromethorphan)  Expectorants - helps loosen mucus to ease removal   Mucinex (generic guaifenesin) as directed on the package.  Headaches / General Aches   Tylenol (generic acetaminophen) - DO NOT EXCEED 3 grams (3,000 mg) in a 24  hour time period  Advil/Motrin (generic ibuprofen)   Sore Throat -   Salt water gargle   Chloraseptic (generic benzocaine) spray or lozenges / Sucrets (generic dyclonine)    Sinusitis Sinusitis is redness, soreness, and inflammation of the paranasal sinuses. Paranasal sinuses are air pockets within the bones of your face (beneath the eyes, the middle of the forehead, or above the eyes). In healthy paranasal sinuses, mucus is able to drain out, and air is able to circulate through them by way of your nose. However, when your paranasal sinuses are inflamed, mucus and air can become trapped. This can allow bacteria and other germs to grow and cause infection. Sinusitis can develop quickly and last only a short time (acute) or continue over a long period (chronic). Sinusitis that lasts for more than 12 weeks is considered chronic.  CAUSES  Causes of sinusitis include:  Allergies.  Structural abnormalities, such as displacement of the cartilage that separates your nostrils (deviated septum), which can decrease the air flow through your nose and sinuses and affect sinus drainage.  Functional abnormalities, such as when the small hairs (cilia) that line your sinuses and help remove mucus do not work properly or are not present. SIGNS AND SYMPTOMS  Symptoms of acute and chronic sinusitis are the same. The primary symptoms are pain and pressure around the affected sinuses. Other symptoms include:  Upper toothache.  Earache.  Headache.  Bad breath.  Decreased sense of smell and taste.  A cough, which worsens when you are lying flat.  Fatigue.  Fever.  Thick drainage from your nose, which often is green and may   contain pus (purulent).  Swelling and warmth over the affected sinuses. DIAGNOSIS  Your health care provider will perform a physical exam. During the exam, your health care provider may:  Look in your nose for signs of abnormal growths in your nostrils (nasal  polyps).  Tap over the affected sinus to check for signs of infection.  View the inside of your sinuses (endoscopy) using an imaging device that has a light attached (endoscope). If your health care provider suspects that you have chronic sinusitis, one or more of the following tests may be recommended:  Allergy tests.  Nasal culture. A sample of mucus is taken from your nose, sent to a lab, and screened for bacteria.  Nasal cytology. A sample of mucus is taken from your nose and examined by your health care provider to determine if your sinusitis is related to an allergy. TREATMENT  Most cases of acute sinusitis are related to a viral infection and will resolve on their own within 10 days. Sometimes medicines are prescribed to help relieve symptoms (pain medicine, decongestants, nasal steroid sprays, or saline sprays).  However, for sinusitis related to a bacterial infection, your health care provider will prescribe antibiotic medicines. These are medicines that will help kill the bacteria causing the infection.  Rarely, sinusitis is caused by a fungal infection. In theses cases, your health care provider will prescribe antifungal medicine. For some cases of chronic sinusitis, surgery is needed. Generally, these are cases in which sinusitis recurs more than 3 times per year, despite other treatments. HOME CARE INSTRUCTIONS   Drink plenty of water. Water helps thin the mucus so your sinuses can drain more easily.  Use a humidifier.  Inhale steam 3 to 4 times a day (for example, sit in the bathroom with the shower running).  Apply a warm, moist washcloth to your face 3 to 4 times a day, or as directed by your health care provider.  Use saline nasal sprays to help moisten and clean your sinuses.  Take medicines only as directed by your health care provider.  If you were prescribed either an antibiotic or antifungal medicine, finish it all even if you start to feel better. SEEK IMMEDIATE  MEDICAL CARE IF:  You have increasing pain or severe headaches.  You have nausea, vomiting, or drowsiness.  You have swelling around your face.  You have vision problems.  You have a stiff neck.  You have difficulty breathing. MAKE SURE YOU:   Understand these instructions.  Will watch your condition.  Will get help right away if you are not doing well or get worse. Document Released: 04/12/2005 Document Revised: 08/27/2013 Document Reviewed: 04/27/2011 ExitCare Patient Information 2015 [employer]. This information is not intended to replace advice given to you by your health care provider. Make sure you discuss any questions you have with your health care provider.   

## 2015-01-15 NOTE — Progress Notes (Signed)
Subjective:    Patient ID: [name] [name], male    DOB: 1941/04/11, 74 y.o.   MRN: [id number]  Chief Complaint  Patient presents with  . Sore Throat    started with a sore throat yesterday, sinus headache, congestion     HPI:  [name] [name] is a 74 y.o. male who  has a past medical history of CAD (coronary artery disease); MI (myocardial infarction); Hyperlipemia; Hypothyroidism; Allergic rhinitis; OSA (obstructive sleep apnea); Psoriasis; Family hx of colon cancer; Hypertension; and CERUMEN IMPACTION (06/04/2009). and presents today for acute office visit.   This is a new problem. Associated symptom of sore throat, dental pain, fatigue, myalgias, congestion, and headache have been going on for about 1 week. Modifying factors include Tylenol which has helped with his headache for a couple of hours. Course of the disease has worsened over time. Denies any recent antibiotic usage.    No Known Allergies   Current Outpatient Prescriptions on File Prior to Visit  Medication Sig Dispense Refill  . aspirin 81 MG tablet Take 81 mg by mouth daily.      . cholecalciferol (VITAMIN D) 1000 UNITS tablet Take 1,000 Units by mouth daily.    . clopidogrel (PLAVIX) 75 MG tablet TAKE 1 TABLET DAILY 90 tablet 0  . levothyroxine (SYNTHROID, LEVOTHROID) 100 MCG tablet TAKE 1 TABLET DAILY 90 tablet 2  . losartan (COZAAR) 100 MG tablet Take 1 tablet (100 mg total) by mouth daily. 90 tablet 3  . metoprolol succinate (TOPROL-XL) 25 MG 24 hr tablet Take 1 tablet (25 mg total) by mouth daily. 90 tablet 3  . Multiple Vitamins-Minerals (CENTRUM SILVER PO) Take 1 tablet by mouth daily.      . niacin (NIASPAN) 1000 MG CR tablet TAKE 2 TABLETS AT BEDTIME 180 tablet 1  . nitroGLYCERIN (NITROSTAT) 0.4 MG SL tablet Place 1 tablet (0.4 mg total) under the tongue every 5 (five) minutes as needed for chest pain. 25 tablet 3  . Omega-3 Fatty Acids (FISH OIL) 1200 MG CAPS Take 1 capsule by mouth daily.     .  simvastatin (ZOCOR) 40 MG tablet TAKE 1 TABLET AT BEDTIME 90 tablet 3   No current facility-administered medications on file prior to visit.    Review of Systems  Constitutional: Positive for chills. Negative for fever.  HENT: Positive for congestion, sinus pressure and sore throat. Negative for ear pain.   Respiratory: Negative for cough, chest tightness and shortness of breath.   Neurological: Positive for headaches.      Objective:    BP 118/74 mmHg  Pulse 55  Temp(Src) 98.1 F (36.7 C) (Oral)  Resp 18  Ht 6\' 1"  (1.854 m)  Wt 206 lb 6.4 oz (93.622 kg)  BMI 27.24 kg/m2  SpO2 97% Nursing note and vital signs reviewed.  Physical Exam  Constitutional: He is oriented to person, place, and time. He appears well-developed and well-nourished. No distress.  HENT:  Right Ear: Hearing, tympanic membrane, external ear and ear canal normal.  Left Ear: Hearing, tympanic membrane, external ear and ear canal normal.  Nose: Right sinus exhibits maxillary sinus tenderness and frontal sinus tenderness. Left sinus exhibits maxillary sinus tenderness and frontal sinus tenderness.  Mouth/Throat: Uvula is midline, oropharynx is clear and moist and mucous membranes are normal.  Cardiovascular: Normal rate, regular rhythm, normal heart sounds and intact distal pulses.   Pulmonary/Chest: Effort normal and breath sounds normal.  Neurological: He is alert and oriented to person, place, and  time.  Skin: Skin is warm and dry.  Psychiatric: He has a normal mood and affect. His behavior is normal. Judgment and thought content normal.       Assessment & Plan:   Problem List Items Addressed This Visit      Respiratory   Acute sinusitis - Primary    Symptoms and exam consistent with bacterial sinusitis. Start Augmentin. Continue over-the-counter medications as needed for symptom relief and supportive care. Follow-up if symptoms worsen or fail to improve.      Relevant Medications    amoxicillin-clavulanate (AUGMENTIN) 875-125 MG per tablet

## 2015-01-15 NOTE — Assessment & Plan Note (Signed)
Symptoms and exam consistent with bacterial sinusitis. Start Augmentin. Continue over-the-counter medications as needed for symptom relief and supportive care. Follow up if symptoms worsen or fail to improve. 

## 2015-01-15 NOTE — Progress Notes (Signed)
Pre visit review using our clinic review tool, if applicable. No additional management support is needed unless otherwise documented below in the visit note. 

## 2015-01-21 ENCOUNTER — Encounter: Payer: Self-pay | Admitting: *Deleted

## 2015-01-22 NOTE — Progress Notes (Signed)
Patient ID: [name] [name], male   DOB: 1940-05-14, 74 y.o.   MRN: [id number] [name] is  74 y.o. previously seen by Dr Olevia Perches. 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 This occluded in 2008. F/U cath for abnormal ETT 11/11 showed occluded RCA and D1 with 70% mid LAD that Dr Olevia Perches decided to manage medically.  He has been doing fairly well  Myovue 04/12/12 low risk  Overall Impression: There is mild ischemia at the base and mid inferior wall. There is normal wall motion. This is a low risk scan.  LV Ejection Fraction: 69%. LV Wall Motion: Normal Wall Motion.  Mild exertional dyspnea BP seems up but he is not taking BP pill that Dr Laurian Brim suggested   ETT 05/20/13 with 10 mintues exercise and positive ECG However he has no angina and had specific symptoms of throat tightness and bad indigestion  Before all his interventions. He does not feel he needs a cath " I know my body"   No chest pain Needs nitro  Tax season very stressful for him and he may retire  Son in [place] will be ordained Catholic priest in January    ROS: Denies fever, malais, weight loss, blurry vision, decreased visual acuity, cough, sputum, SOB, hemoptysis, pleuritic pain, palpitaitons, heartburn, abdominal pain, melena, lower extremity edema, claudication, or rash.  All other systems reviewed and negative  General: Affect appropriate Healthy:  appears stated age 89: normal Neck supple with no adenopathy JVP normal no bruits no thyromegaly Lungs clear with no wheezing and good diaphragmatic motion Heart:  S1/S2 no murmur, no rub, gallop or click PMI normal Abdomen: benighn, BS positve, no tenderness, no AAA no bruit.  No HSM or HJR Distal pulses intact with no bruits No edema Neuro non-focal Skin warm and dry No muscular weakness   Current Outpatient Prescriptions  Medication Sig  Dispense Refill  . aspirin 81 MG tablet Take 81 mg by mouth daily.      . cholecalciferol (VITAMIN D) 1000 UNITS tablet Take 1,000 Units by mouth daily.    . clopidogrel (PLAVIX) 75 MG tablet Take 75 mg by mouth daily.    [name] [name] levothyroxine (SYNTHROID, LEVOTHROID) 100 MCG tablet Take 100 mcg by mouth daily before breakfast.    . losartan (COZAAR) 100 MG tablet Take 1 tablet (100 mg total) by mouth daily. 90 tablet 3  . metoprolol succinate (TOPROL-XL) 25 MG 24 hr tablet Take 1 tablet (25 mg total) by mouth daily. 90 tablet 3  . Multiple Vitamins-Minerals (CENTRUM SILVER PO) Take 1 tablet by mouth daily.      . niacin (NIASPAN) 1000 MG CR tablet Take 2,000 mg by mouth at bedtime.     . nitroGLYCERIN (NITROSTAT) 0.4 MG SL tablet Place 1 tablet (0.4 mg total) under the tongue every 5 (five) minutes as needed for chest pain (3 doses MAX). 25 tablet 4  . Omega-3 Fatty Acids (FISH OIL) 1200 MG CAPS Take 1 capsule by mouth daily.     . simvastatin (ZOCOR) 40 MG tablet Take 40 mg by mouth daily.     No current facility-administered medications for this visit.    Allergies  Review of patient's allergies indicates no known allergies.  Electrocardiogram:  SR LAFB RBBB  10/09/13  01/23/15  NSR rate 60 LAD RBBB LVH no change   Assessment and Plan CAD: stable no nitro on  good medical Rx new nitro called in HTN: Well controlled.  Continue current medications and low sodium Dash type diet.   Thyroid   Lab Results  Component Value Date   TSH 3.30 10/08/2014   Chol:  Lab Results  Component Value Date   [place] 81 10/08/2014   FU with me in a year

## 2015-01-23 ENCOUNTER — Ambulatory Visit (INDEPENDENT_AMBULATORY_CARE_PROVIDER_SITE_OTHER): Payer: Medicare Other | Admitting: Cardiovascular Disease

## 2015-01-23 ENCOUNTER — Encounter: Payer: Self-pay | Admitting: Cardiovascular Disease

## 2015-01-23 VITALS — BP 142/70 | HR 60 | Ht 73.0 in | Wt 202.8 lb

## 2015-01-23 DIAGNOSIS — I10 Essential (primary) hypertension: Secondary | ICD-10-CM

## 2015-01-23 DIAGNOSIS — I2510 Atherosclerotic heart disease of native coronary artery without angina pectoris: Secondary | ICD-10-CM

## 2015-01-23 MED ORDER — NITROGLYCERIN 0.4 MG SL SUBL: 0.4000 mg | SUBLINGUAL_TABLET | SUBLINGUAL | Status: DC | PRN

## 2015-01-23 MED ORDER — NITROGLYCERIN 0.4 MG SL SUBL: 0.4000 mg | SUBLINGUAL_TABLET | SUBLINGUAL | Status: AC | PRN

## 2015-01-23 NOTE — Patient Instructions (Signed)
Medication Instructions:  Your physician recommends that you continue on your current medications as directed. Please refer to the Current Medication list given to you today.   Labwork: NONE Testing/Procedures: NONE  Follow-Up: Your physician wants you to follow-up in: YEAR WITH  DR NISHAN  You will receive a reminder letter in the mail two months in advance. If you don't receive a letter, please call our office to schedule the follow-up appointment.  Any Other Special Instructions Will Be Listed Below (If Applicable).   

## 2015-04-04 DIAGNOSIS — H02831 Dermatochalasis of right upper eyelid: Secondary | ICD-10-CM | POA: Diagnosis not present

## 2015-04-04 DIAGNOSIS — H01025 Squamous blepharitis left lower eyelid: Secondary | ICD-10-CM | POA: Diagnosis not present

## 2015-04-04 DIAGNOSIS — H01024 Squamous blepharitis left upper eyelid: Secondary | ICD-10-CM | POA: Diagnosis not present

## 2015-04-04 DIAGNOSIS — H01021 Squamous blepharitis right upper eyelid: Secondary | ICD-10-CM | POA: Diagnosis not present

## 2015-04-04 DIAGNOSIS — H02834 Dermatochalasis of left upper eyelid: Secondary | ICD-10-CM | POA: Diagnosis not present

## 2015-04-04 DIAGNOSIS — D3132 Benign neoplasm of left choroid: Secondary | ICD-10-CM | POA: Diagnosis not present

## 2015-04-04 DIAGNOSIS — H01022 Squamous blepharitis right lower eyelid: Secondary | ICD-10-CM | POA: Diagnosis not present

## 2015-04-04 DIAGNOSIS — H25813 Combined forms of age-related cataract, bilateral: Secondary | ICD-10-CM | POA: Diagnosis not present

## 2015-04-09 ENCOUNTER — Encounter: Payer: Self-pay | Admitting: Internal Medicine

## 2015-04-09 ENCOUNTER — Ambulatory Visit (INDEPENDENT_AMBULATORY_CARE_PROVIDER_SITE_OTHER): Payer: Medicare Other | Admitting: Internal Medicine

## 2015-04-09 VITALS — BP 148/70 | HR 63 | Temp 97.8°F | Wt 205.0 lb

## 2015-04-09 DIAGNOSIS — I2510 Atherosclerotic heart disease of native coronary artery without angina pectoris: Secondary | ICD-10-CM | POA: Diagnosis not present

## 2015-04-09 DIAGNOSIS — S90211A Contusion of right great toe with damage to nail, initial encounter: Secondary | ICD-10-CM | POA: Diagnosis not present

## 2015-04-09 DIAGNOSIS — L539 Erythematous condition, unspecified: Secondary | ICD-10-CM

## 2015-04-09 DIAGNOSIS — J0100 Acute maxillary sinusitis, unspecified: Secondary | ICD-10-CM

## 2015-04-09 MED ORDER — ACYCLOVIR 800 MG PO TABS: 800.0000 mg | ORAL_TABLET | Freq: Every day | ORAL | Status: DC

## 2015-04-09 MED ORDER — AMOXICILLIN-POT CLAVULANATE 875-125 MG PO TABS: 1.0000 | ORAL_TABLET | Freq: Two times a day (BID) | ORAL | Status: DC

## 2015-04-09 NOTE — Assessment & Plan Note (Signed)
See procedure 

## 2015-04-09 NOTE — Assessment & Plan Note (Signed)
Augmentin Rx 

## 2015-04-09 NOTE — Progress Notes (Signed)
Pre visit review using our clinic review tool, if applicable. No additional management support is needed unless otherwise documented below in the visit note. 

## 2015-04-09 NOTE — Progress Notes (Signed)
Subjective:  Patient ID: [name] [name], male    DOB: [birth year]  Age: 74 y.o. MRN: [id number]  CC: No chief complaint on file.   HPI [name] presents for R sinus pain x 3 d, nasal d/c C/o big R toe pain - s/p injury: pt dropped a box on his foot 3 d ago - black nail  Outpatient Prescriptions Prior to Visit  Medication Sig Dispense Refill  . aspirin 81 MG tablet Take 81 mg by mouth daily.      . cholecalciferol (VITAMIN D) 1000 UNITS tablet Take 1,000 Units by mouth daily.    . clopidogrel (PLAVIX) 75 MG tablet Take 75 mg by mouth daily.    [name] levothyroxine (SYNTHROID, LEVOTHROID) 100 MCG tablet Take 100 mcg by mouth daily before breakfast.    . losartan (COZAAR) 100 MG tablet Take 1 tablet (100 mg total) by mouth daily. 90 tablet 3  . metoprolol succinate (TOPROL-XL) 25 MG 24 hr tablet Take 1 tablet (25 mg total) by mouth daily. 90 tablet 3  . Multiple Vitamins-Minerals (CENTRUM SILVER PO) Take 1 tablet by mouth daily.      . niacin (NIASPAN) 1000 MG CR tablet Take 2,000 mg by mouth at bedtime.     . nitroGLYCERIN (NITROSTAT) 0.4 MG SL tablet Place 1 tablet (0.4 mg total) under the tongue every 5 (five) minutes as needed for chest pain (3 doses MAX). 25 tablet 4  . Omega-3 Fatty Acids (FISH OIL) 1200 MG CAPS Take 1 capsule by mouth daily.     . simvastatin (ZOCOR) 40 MG tablet Take 40 mg by mouth daily.     No facility-administered medications prior to visit.    ROS Review of Systems  Constitutional: Positive for fever. Negative for appetite change, fatigue and unexpected weight change.  HENT: Positive for rhinorrhea and sinus pressure. Negative for congestion, nosebleeds, sneezing, sore throat, trouble swallowing and voice change.   Eyes: Negative for itching and visual disturbance.  Respiratory: Negative for cough and wheezing.   Cardiovascular: Negative for chest pain, palpitations and leg swelling.  Gastrointestinal: Negative for nausea, diarrhea, blood in stool  and abdominal distention.  Genitourinary: Negative for frequency and hematuria.  Musculoskeletal: Negative for back pain, joint swelling, gait problem and neck pain.  Skin: Negative for rash.  Neurological: Negative for dizziness, tremors, speech difficulty and weakness.  Psychiatric/Behavioral: Negative for sleep disturbance, dysphoric mood and agitation. The patient is not nervous/anxious.     Objective:  BP 148/70 mmHg  Pulse 63  Temp(Src) 97.8 F (36.6 C) (Oral)  Wt 205 lb (92.987 kg)  SpO2 98%  BP Readings from Last 3 Encounters:  04/09/15 148/70  01/23/15 142/70  01/15/15 118/74    Wt Readings from Last 3 Encounters:  04/09/15 205 lb (92.987 kg)  01/23/15 202 lb 12.8 oz (91.989 kg)  01/15/15 206 lb 6.4 oz (93.622 kg)    Physical Exam  Constitutional: He is oriented to person, place, and time. He appears well-developed. No distress.  NAD  HENT:  Mouth/Throat: Oropharynx is clear and moist. No oropharyngeal exudate.  Eyes: Conjunctivae are normal. Pupils are equal, round, and reactive to light.  Neck: Normal range of motion. No JVD present. No thyromegaly present.  Cardiovascular: Normal rate, regular rhythm, normal heart sounds and intact distal pulses.  Exam reveals no gallop and no friction rub.   No murmur heard. Pulmonary/Chest: Effort normal and breath sounds normal. No respiratory distress. He has no wheezes. He has no rales.  He exhibits no tenderness.  Abdominal: Soft. Bowel sounds are normal. He exhibits no distension and no mass. There is no tenderness. There is no rebound and no guarding.  Musculoskeletal: Normal range of motion. He exhibits tenderness. He exhibits no edema.  Lymphadenopathy:    He has no cervical adenopathy.  Neurological: He is alert and oriented to person, place, and time. He has normal reflexes. No cranial nerve deficit. He exhibits normal muscle tone. He displays a negative Romberg sign. Coordination and gait normal.  Skin: Skin is warm  and dry. No rash noted.  Psychiatric: He has a normal mood and affect. His behavior is normal. Judgment and thought content normal.  R cheek is tender w/watery swelling under L eye - ?rash Nasal passages are inflamed Sub-ungal hematoma R big toenail  Procedure: sub-ungal hematoma evacuation R big toenail - Dr Tamala Julian Verbal consent. A hole was drilled w/18g needle - bloody liquid was expressed, pressure was released. Tolerated well. Band aid.   Lab Results  Component Value Date   WBC 6.5 10/08/2014   HGB 14.0 10/08/2014   HCT 41.4 10/08/2014   PLT 225.0 10/08/2014   GLUCOSE 95 10/08/2014   CHOL 145 10/08/2014   TRIG 108.0 10/08/2014   HDL 42.50 10/08/2014   LDLCALC 81 10/08/2014   ALT 29 10/08/2014   AST 28 10/08/2014   NA 139 10/08/2014   K 3.9 10/08/2014   CL 105 10/08/2014   CREATININE 0.86 10/08/2014   BUN 12 10/08/2014   CO2 27 10/08/2014   TSH 3.30 10/08/2014   PSA 0.49 10/08/2014   INR 1.0 ratio 03/06/2010   HGBA1C 5.8 08/28/2013    Dg Cervical Spine Complete  05/29/2014  CLINICAL DATA:  Right-sided neck pain and stiffness. EXAM: CERVICAL SPINE  4+ VIEWS COMPARISON:  None. FINDINGS: There is slight foraminal narrowing at C3-4 and C4-5 on the right. There is small anterior osteophytes at several levels in the cervical spine with slight narrowing of the C6-7 disc space. Prevertebral soft tissues are normal. No fracture or subluxation or bone destruction. 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 Electronically Signed   By: Rozetta Nunnery M.D.   On: 05/29/2014 13:51    Assessment & Plan:   There are no diagnoses linked to this encounter. I am having Mr. Tousignant start on amoxicillin-clavulanate and acyclovir. I am also having him maintain his aspirin, Multiple Vitamins-Minerals (CENTRUM SILVER PO), Fish Oil, losartan, metoprolol succinate, cholecalciferol, clopidogrel, levothyroxine, niacin, simvastatin, and  nitroGLYCERIN.  Meds ordered this encounter  Medications  . amoxicillin-clavulanate (AUGMENTIN) 875-125 MG tablet    Sig: Take 1 tablet by mouth 2 (two) times daily.    Dispense:  20 tablet    Refill:  0  . acyclovir (ZOVIRAX) 800 MG tablet    Sig: Take 1 tablet (800 mg total) by mouth 5 (five) times daily.    Dispense:  35 tablet    Refill:  0     Follow-up: No Follow-up on file.  Walker Kehr, MD

## 2015-04-09 NOTE — Patient Instructions (Signed)
Align x 7-14 days

## 2015-04-09 NOTE — Assessment & Plan Note (Signed)
erythema under R eye - r/o zoster rash vs secondary to sinusitis Acyclovir if blisters develop

## 2015-05-18 ENCOUNTER — Other Ambulatory Visit: Payer: Self-pay | Admitting: Cardiovascular Disease

## 2015-05-27 ENCOUNTER — Ambulatory Visit (INDEPENDENT_AMBULATORY_CARE_PROVIDER_SITE_OTHER): Payer: Medicare Other | Admitting: Internal Medicine

## 2015-05-27 ENCOUNTER — Encounter: Payer: Self-pay | Admitting: Internal Medicine

## 2015-05-27 VITALS — BP 124/70 | HR 60 | Temp 98.9°F | Resp 20 | Ht 73.0 in | Wt 206.0 lb

## 2015-05-27 DIAGNOSIS — I10 Essential (primary) hypertension: Secondary | ICD-10-CM | POA: Diagnosis not present

## 2015-05-27 DIAGNOSIS — R040 Epistaxis: Secondary | ICD-10-CM | POA: Diagnosis not present

## 2015-05-27 MED ORDER — NEOMYCIN-POLYMYXIN-HC 3.5-10000-1 OT SOLN: 4.0000 [drp] | Freq: Four times a day (QID) | OTIC | Status: DC

## 2015-05-27 NOTE — Progress Notes (Signed)
Pre visit review using our clinic review tool, if applicable. No additional management support is needed unless otherwise documented below in the visit note. 

## 2015-05-27 NOTE — Assessment & Plan Note (Signed)
X 1 episode yesterday, likely incidental, has not recurred, is on plavix, but minor bleed in nature, will hold on further lab work or ENT for now, but to consdier if recurs

## 2015-05-27 NOTE — Patient Instructions (Addendum)
Please take all new medication as prescribed - the ear drops (to [facility])  Please call for pill antibiotic if you have worsening pain, fever, swelling or discharge related to the ear  Please call if you have further nosebleed, as if severe, your plavix may need to be held, and you may need ENT referral  Please continue all other medications as before, and refills have been done if requested.  Please have the pharmacy call with any other refills you may need.  Please keep your appointments with your specialists as you may have planned

## 2015-05-27 NOTE — Progress Notes (Signed)
Subjective:    Patient ID: [name] [name], male    DOB: January 10, 1941, 75 y.o.   MRN: [id number]  HPI  Here with c/o 2-3 day onset right ear pain, tenderness, worse to lie on right side and touch, feeling warm without fever, also without hearing loss, HA, ST, cough, or sinus pain.  DId have incidental right nosebleed yesterday, but has happened rare in the past.  Denies sinus pain, congestion or d/c. Pt denies chest pain, increased sob or doe, wheezing, orthopnea, PND, increased LE swelling, palpitations, dizziness or syncope.   Pt denies new neurological symptoms such as new headache, or facial or extremity weakness or numbness   Pt denies polydipsia, polyuria Past Medical History  Diagnosis Date  . CAD (coronary artery disease)   . MI (myocardial infarction) ([place])   . Hyperlipemia   . Hypothyroidism   . Allergic rhinitis   . OSA (obstructive sleep apnea)   . Psoriasis   . Family hx of colon cancer     brother  . Hypertension   . CERUMEN IMPACTION 06/04/2009    5/15     Past Surgical History  Procedure Laterality Date  . Ptca    . Tonsillectomy    . Cardiac catheterization      reports that he has quit smoking. He has never used smokeless tobacco. He reports that he drinks about 3.6 oz of alcohol per week. He reports that he does not use illicit drugs. family history includes COPD in his father; Cancer in his brother and brother; Colon cancer in his brother, maternal aunt, maternal aunt, maternal uncle, paternal aunt, and paternal grandfather; Colon polyps in his sister; Coronary artery disease in his mother; Heart disease in his mother. No Known Allergies Current Outpatient Prescriptions on File Prior to Visit  Medication Sig Dispense Refill  . acyclovir (ZOVIRAX) 800 MG tablet Take 1 tablet (800 mg total) by mouth 5 (five) times daily. 35 tablet 0  . amoxicillin-clavulanate (AUGMENTIN) 875-125 MG tablet Take 1 tablet by mouth 2 (two) times daily. 20 tablet 0  . aspirin 81 MG  tablet Take 81 mg by mouth daily.      . cholecalciferol (VITAMIN D) 1000 UNITS tablet Take 1,000 Units by mouth daily.    . clopidogrel (PLAVIX) 75 MG tablet TAKE 1 TABLET DAILY 90 tablet 2  . levothyroxine (SYNTHROID, LEVOTHROID) 100 MCG tablet Take 100 mcg by mouth daily before breakfast.    . losartan (COZAAR) 100 MG tablet Take 1 tablet (100 mg total) by mouth daily. 90 tablet 3  . metoprolol succinate (TOPROL-XL) 25 MG 24 hr tablet Take 1 tablet (25 mg total) by mouth daily. 90 tablet 3  . Multiple Vitamins-Minerals (CENTRUM SILVER PO) Take 1 tablet by mouth daily.      . niacin (NIASPAN) 1000 MG CR tablet Take 2,000 mg by mouth at bedtime.     . nitroGLYCERIN (NITROSTAT) 0.4 MG SL tablet Place 1 tablet (0.4 mg total) under the tongue every 5 (five) minutes as needed for chest pain (3 doses MAX). 25 tablet 4  . Omega-3 Fatty Acids (FISH OIL) 1200 MG CAPS Take 1 capsule by mouth daily.     . simvastatin (ZOCOR) 40 MG tablet Take 40 mg by mouth daily.     No current facility-administered medications on file prior to visit.    Review of Systems  Constitutional: Negative for unusual diaphoresis or night sweats HENT: Negative for ringing in ear or discharge Eyes: Negative for double vision  or worsening visual disturbance.  Respiratory: Negative for choking and stridor.   Gastrointestinal: Negative for vomiting or other signifcant bowel change Genitourinary: Negative for hematuria or change in urine volume.  Musculoskeletal: Negative for other MSK pain or swelling Skin: Negative for color change and worsening wound.  Neurological: Negative for tremors and numbness other than noted  Psychiatric/Behavioral: Negative for decreased concentration or agitation other than above        Objective:   Physical Exam BP 124/70 mmHg  Pulse 60  Temp(Src) 98.9 F (37.2 C) (Oral)  Resp 20  Ht 6\' 1"  (1.854 m)  Wt 206 lb (93.441 kg)  BMI 27.18 kg/m2  SpO2 96% VS noted, non toxic  appearing Constitutional: Pt appears in no significant distress HENT: Head: NCAT.  Right Ear: External ear normal.  Left Ear: External ear normal.  Left canal without erythema/tender/swelling or dc Right canal with 1-2+ erythema/tender/swelling without d/c, does have some mucoid material mid canal, no blood Nares without bleeding, swelling, mass or tender Eyes: . Pupils are equal, round, and reactive to light. Conjunctivae and EOM are normal Neck: Normal range of motion. Neck supple.  Cardiovascular: Normal rate and regular rhythm.   Pulmonary/Chest: Effort normal and breath sounds without rales or wheezing.  Neurological: Pt is alert. Not confused , motor grossly intact Skin: Skin is warm. No rash, no LE edema Psychiatric: Pt behavior is normal. No agitation.     Assessment & Plan:

## 2015-05-27 NOTE — Assessment & Plan Note (Signed)
stable overall by history and exam, recent data reviewed with pt, and pt to continue medical treatment as before,  to f/u any worsening symptoms or concerns BP Readings from Last 3 Encounters:  05/27/15 124/70  04/09/15 148/70  01/23/15 142/70

## 2015-06-30 ENCOUNTER — Encounter: Payer: Self-pay | Admitting: *Deleted

## 2015-07-21 ENCOUNTER — Encounter: Payer: Self-pay | Admitting: *Deleted

## 2015-07-26 ENCOUNTER — Other Ambulatory Visit: Payer: Self-pay | Admitting: Internal Medicine

## 2015-07-26 ENCOUNTER — Other Ambulatory Visit: Payer: Self-pay | Admitting: Cardiovascular Disease

## 2015-08-21 ENCOUNTER — Encounter: Payer: Self-pay | Admitting: Internal Medicine

## 2015-10-15 ENCOUNTER — Encounter: Payer: Self-pay | Admitting: Internal Medicine

## 2015-10-15 ENCOUNTER — Other Ambulatory Visit (INDEPENDENT_AMBULATORY_CARE_PROVIDER_SITE_OTHER): Payer: Medicare Other

## 2015-10-15 ENCOUNTER — Ambulatory Visit (INDEPENDENT_AMBULATORY_CARE_PROVIDER_SITE_OTHER): Payer: Medicare Other | Admitting: Internal Medicine

## 2015-10-15 VITALS — BP 130/72 | HR 61 | Ht 73.0 in | Wt 207.0 lb

## 2015-10-15 DIAGNOSIS — I2510 Atherosclerotic heart disease of native coronary artery without angina pectoris: Secondary | ICD-10-CM

## 2015-10-15 DIAGNOSIS — Z0000 Encounter for general adult medical examination without abnormal findings: Secondary | ICD-10-CM | POA: Diagnosis not present

## 2015-10-15 DIAGNOSIS — E785 Hyperlipidemia, unspecified: Secondary | ICD-10-CM

## 2015-10-15 DIAGNOSIS — N320 Bladder-neck obstruction: Secondary | ICD-10-CM | POA: Diagnosis not present

## 2015-10-15 DIAGNOSIS — E034 Atrophy of thyroid (acquired): Secondary | ICD-10-CM

## 2015-10-15 DIAGNOSIS — E038 Other specified hypothyroidism: Secondary | ICD-10-CM

## 2015-10-15 LAB — CBC WITH DIFFERENTIAL/PLATELET
BASOS ABS: 0 10*3/uL (ref 0.0–0.1)
Basophils Relative: 0.5 % (ref 0.0–3.0)
EOS ABS: 0.3 10*3/uL (ref 0.0–0.7)
EOS PCT: 3.6 % (ref 0.0–5.0)
HCT: 41.3 % (ref 39.0–52.0)
Hemoglobin: 13.8 g/dL (ref 13.0–17.0)
LYMPHS ABS: 2.3 10*3/uL (ref 0.7–4.0)
LYMPHS PCT: 27.6 % (ref 12.0–46.0)
MCHC: 33.4 g/dL (ref 30.0–36.0)
MCV: 88.5 fl (ref 78.0–100.0)
MONOS PCT: 7.9 % (ref 3.0–12.0)
Monocytes Absolute: 0.7 10*3/uL (ref 0.1–1.0)
NEUTROS ABS: 5 10*3/uL (ref 1.4–7.7)
NEUTROS PCT: 60.4 % (ref 43.0–77.0)
PLATELETS: 198 10*3/uL (ref 150.0–400.0)
RBC: 4.68 Mil/uL (ref 4.22–5.81)
RDW: 14.4 % (ref 11.5–15.5)
WBC: 8.3 10*3/uL (ref 4.0–10.5)

## 2015-10-15 LAB — HEPATIC FUNCTION PANEL
ALK PHOS: 38 U/L — AB (ref 39–117)
ALT: 21 U/L (ref 0–53)
AST: 22 U/L (ref 0–37)
Albumin: 4.4 g/dL (ref 3.5–5.2)
BILIRUBIN DIRECT: 0.2 mg/dL (ref 0.0–0.3)
BILIRUBIN TOTAL: 0.9 mg/dL (ref 0.2–1.2)
Total Protein: 6.7 g/dL (ref 6.0–8.3)

## 2015-10-15 LAB — URINALYSIS
BILIRUBIN URINE: NEGATIVE
Hgb urine dipstick: NEGATIVE
KETONES UR: NEGATIVE
LEUKOCYTES UA: NEGATIVE
Nitrite: NEGATIVE
Specific Gravity, Urine: <=1.005 — AB (ref 1.000–1.030)
UROBILINOGEN UA: 0.2 (ref 0.0–1.0)
Urine Glucose: NEGATIVE
pH: 7 (ref 5.0–8.0)

## 2015-10-15 LAB — BASIC METABOLIC PANEL
BUN: 10 mg/dL (ref 6–23)
CO2: 26 meq/L (ref 19–32)
Calcium: 9.6 mg/dL (ref 8.4–10.5)
Chloride: 106 mEq/L (ref 96–112)
Creatinine, Ser: 0.91 mg/dL (ref 0.40–1.50)
GFR: 86.44 mL/min (ref >60.00)
GLUCOSE: 98 mg/dL (ref 70–99)
POTASSIUM: 4 meq/L (ref 3.5–5.1)
SODIUM: 140 meq/L (ref 135–145)

## 2015-10-15 LAB — TSH: TSH: 4.29 u[IU]/mL (ref 0.35–4.50)

## 2015-10-15 LAB — LIPID PANEL
CHOLESTEROL: 139 mg/dL (ref 0–200)
HDL: 45.4 mg/dL (ref >39.00)
LDL Cholesterol: 70 mg/dL (ref 0–99)
NonHDL: 93.98
Total CHOL/HDL Ratio: 3
Triglycerides: 118 mg/dL (ref 0.0–149.0)
VLDL: 23.6 mg/dL (ref 0.0–40.0)

## 2015-10-15 LAB — PSA: PSA: 0.48 ng/mL (ref 0.10–4.00)

## 2015-10-15 MED ORDER — METOPROLOL SUCCINATE ER 25 MG PO TB24: 25.0000 mg | ORAL_TABLET | Freq: Every day | ORAL | Status: DC

## 2015-10-15 MED ORDER — SIMVASTATIN 40 MG PO TABS: 40.0000 mg | ORAL_TABLET | Freq: Every day | ORAL | Status: DC

## 2015-10-15 MED ORDER — LOSARTAN POTASSIUM 100 MG PO TABS: 100.0000 mg | ORAL_TABLET | Freq: Every day | ORAL | Status: DC

## 2015-10-15 NOTE — Progress Notes (Signed)
Subjective:  Patient ID: [name] [name], male    DOB: Jul 24, 1940  Age: 75 y.o. MRN: [id number]  CC: No chief complaint on file.   HPI [name] [name] presents for a well exam  Outpatient Prescriptions Prior to Visit  Medication Sig Dispense Refill  . aspirin 81 MG tablet Take 81 mg by mouth daily.      . cholecalciferol (VITAMIN D) 1000 UNITS tablet Take 1,000 Units by mouth daily.    . clopidogrel (PLAVIX) 75 MG tablet TAKE 1 TABLET DAILY 90 tablet 2  . levothyroxine (SYNTHROID, LEVOTHROID) 100 MCG tablet TAKE 1 TABLET DAILY 90 tablet 1  . losartan (COZAAR) 100 MG tablet Take 1 tablet (100 mg total) by mouth daily. 90 tablet 3  . metoprolol succinate (TOPROL-XL) 25 MG 24 hr tablet Take 1 tablet (25 mg total) by mouth daily. 90 tablet 3  . Multiple Vitamins-Minerals (CENTRUM SILVER PO) Take 1 tablet by mouth daily.      [name] neomycin-polymyxin-hydrocortisone (CORTISPORIN) otic solution Place 4 drops into the right ear 4 (four) times daily. 10 mL 0  . niacin (NIASPAN) 1000 MG CR tablet Take 2,000 mg by mouth at bedtime.     . niacin (NIASPAN) 1000 MG CR tablet TAKE 2 TABLETS AT BEDTIME 180 tablet 1  . nitroGLYCERIN (NITROSTAT) 0.4 MG SL tablet Place 1 tablet (0.4 mg total) under the tongue every 5 (five) minutes as needed for chest pain (3 doses MAX). 25 tablet 4  . Omega-3 Fatty Acids (FISH OIL) 1200 MG CAPS Take 1 capsule by mouth daily.     . simvastatin (ZOCOR) 40 MG tablet Take 40 mg by mouth daily.    [name] acyclovir (ZOVIRAX) 800 MG tablet Take 1 tablet (800 mg total) by mouth 5 (five) times daily. (Patient not taking: Reported on 10/15/2015) 35 tablet 0  . amoxicillin-clavulanate (AUGMENTIN) 875-125 MG tablet Take 1 tablet by mouth 2 (two) times daily. (Patient not taking: Reported on 10/15/2015) 20 tablet 0   No facility-administered medications prior to visit.    ROS Review of Systems  Constitutional: Negative for appetite change, fatigue and unexpected weight change.  HENT:  Negative for congestion, nosebleeds, sneezing, sore throat and trouble swallowing.   Eyes: Negative for itching and visual disturbance.  Respiratory: Negative for cough.   Cardiovascular: Negative for chest pain, palpitations and leg swelling.  Gastrointestinal: Negative for nausea, diarrhea, blood in stool and abdominal distention.  Genitourinary: Negative for frequency and hematuria.  Musculoskeletal: Negative for back pain, joint swelling, gait problem and neck pain.  Skin: Negative for rash.  Neurological: Negative for dizziness, tremors, speech difficulty and weakness.  Psychiatric/Behavioral: Negative for suicidal ideas, sleep disturbance, dysphoric mood and agitation. The patient is not nervous/anxious.     Objective:  BP 130/72 mmHg  Pulse 61  Ht 6\' 1"  (1.854 m)  Wt 207 lb (93.895 kg)  BMI 27.32 kg/m2  SpO2 97%  BP Readings from Last 3 Encounters:  10/15/15 130/72  05/27/15 124/70  04/09/15 148/70    Wt Readings from Last 3 Encounters:  10/15/15 207 lb (93.895 kg)  05/27/15 206 lb (93.441 kg)  04/09/15 205 lb (92.987 kg)    Physical Exam  Constitutional: He is oriented to person, place, and time. He appears well-developed and well-nourished. No distress.  HENT:  Head: Normocephalic and atraumatic.  Right Ear: External ear normal.  Left Ear: External ear normal.  Nose: Nose normal.  Mouth/Throat: Oropharynx is clear and moist. No oropharyngeal exudate.  Eyes:  Conjunctivae and EOM are normal. Pupils are equal, round, and reactive to light. Right eye exhibits no discharge. Left eye exhibits no discharge. No scleral icterus.  Neck: Normal range of motion. Neck supple. No JVD present. No tracheal deviation present. No thyromegaly present.  Cardiovascular: Normal rate, regular rhythm, normal heart sounds and intact distal pulses.  Exam reveals no gallop and no friction rub.   No murmur heard. Pulmonary/Chest: Effort normal and breath sounds normal. No stridor. No  respiratory distress. He has no wheezes. He has no rales. He exhibits no tenderness.  Abdominal: Soft. Bowel sounds are normal. He exhibits no distension and no mass. There is no tenderness. There is no rebound and no guarding.  Genitourinary: Rectum normal and penis normal. Guaiac negative stool. No penile tenderness.  Musculoskeletal: Normal range of motion. He exhibits no edema or tenderness.  Lymphadenopathy:    He has no cervical adenopathy.  Neurological: He is alert and oriented to person, place, and time. He has normal reflexes. No cranial nerve deficit. He exhibits normal muscle tone. Coordination normal.  Skin: Skin is warm and dry. No rash noted. He is not diaphoretic. No erythema. No pallor.  Psychiatric: He has a normal mood and affect. His behavior is normal. Judgment and thought content normal.  prostate 1+  Lab Results  Component Value Date   WBC 6.5 10/08/2014   HGB 14.0 10/08/2014   HCT 41.4 10/08/2014   PLT 225.0 10/08/2014   GLUCOSE 95 10/08/2014   CHOL 145 10/08/2014   TRIG 108.0 10/08/2014   HDL 42.50 10/08/2014   LDLCALC 81 10/08/2014   ALT 29 10/08/2014   AST 28 10/08/2014   NA 139 10/08/2014   K 3.9 10/08/2014   CL 105 10/08/2014   CREATININE 0.86 10/08/2014   BUN 12 10/08/2014   CO2 27 10/08/2014   TSH 3.30 10/08/2014   PSA 0.49 10/08/2014   INR 1.0 ratio 03/06/2010   HGBA1C 5.8 08/28/2013    Dg Cervical Spine Complete  05/29/2014  CLINICAL DATA:  Right-sided neck pain and stiffness. EXAM: CERVICAL SPINE  4+ VIEWS COMPARISON:  None. FINDINGS: There is slight foraminal narrowing at C3-4 and C4-5 on the right. There is small anterior osteophytes at several levels in the cervical spine with slight narrowing of the C6-7 disc space. Prevertebral soft tissues are normal. No fracture or subluxation or bone destruction. 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 Electronically Signed   By: Rozetta Nunnery  M.D.   On: 05/29/2014 13:51    Assessment & Plan:   Diagnoses and all orders for this visit:  Atherosclerosis of native coronary artery of native heart without angina pectoris  Well adult exam   I have discontinued Mr. Polkinghorn amoxicillin-clavulanate. I am also having him maintain his aspirin, Multiple Vitamins-Minerals (CENTRUM SILVER PO), Fish Oil, losartan, metoprolol succinate, cholecalciferol, niacin, simvastatin, nitroGLYCERIN, acyclovir, clopidogrel, neomycin-polymyxin-hydrocortisone, levothyroxine, and niacin.  No orders of the defined types were placed in this encounter.     Follow-up: No Follow-up on file.  Walker Kehr, MD

## 2015-10-15 NOTE — Assessment & Plan Note (Addendum)
Here for medicare wellness/physical  Diet: heart healthy  Physical activity: not sedentary  Depression/mood screen: negative  Hearing: intact to whispered voice  Visual acuity: grossly normal, performs annual eye exam  ADLs: capable  Fall risk: none  Home safety: good  Cognitive evaluation: intact to orientation, naming, recall and repetition  EOL planning: adv directives, full code/ I agree  I have personally reviewed and have noted  1. The patient's medical, surgical and social history  2. Their use of alcohol, tobacco or illicit drugs  3. Their current medications and supplements  4. The patient's functional ability including ADL's, fall risks, home safety risks and hearing or visual impairment.  5. Diet and physical activities  6. Evidence for depression or mood disorders 7. The roster of all physicians providing medical care to patient - is listed in the Snapshot section of the chart and reviewed today.    Today patient counseled on age appropriate routine health concerns for screening and prevention, each reviewed and up to date or declined. Immunizations reviewed and up to date or declined. Labs ordered and reviewed. Risk factors for depression reviewed and negative. Hearing function and visual acuity are intact. ADLs screened and addressed as needed. Functional ability and level of safety reviewed and appropriate. Education, counseling and referrals performed based on assessed risks today. Patient provided with a copy of personalized plan for preventive services.   Colonoscopy pending

## 2015-10-15 NOTE — Progress Notes (Signed)
Pre visit review using our clinic review tool, if applicable. No additional management support is needed unless otherwise documented below in the visit note. 

## 2015-10-15 NOTE — Assessment & Plan Note (Signed)
Labs

## 2015-10-15 NOTE — Assessment & Plan Note (Addendum)
  Metoprolol, Losartan, ASA, Simvastatin 

## 2015-10-15 NOTE — Patient Instructions (Signed)

## 2015-12-05 ENCOUNTER — Encounter: Payer: Self-pay | Admitting: *Deleted

## 2015-12-18 ENCOUNTER — Telehealth: Payer: Self-pay | Admitting: *Deleted

## 2015-12-18 ENCOUNTER — Ambulatory Visit (INDEPENDENT_AMBULATORY_CARE_PROVIDER_SITE_OTHER): Payer: Medicare Other | Admitting: Internal Medicine

## 2015-12-18 ENCOUNTER — Encounter: Payer: Self-pay | Admitting: Internal Medicine

## 2015-12-18 VITALS — BP 130/74 | HR 62 | Ht 73.0 in | Wt 210.0 lb

## 2015-12-18 DIAGNOSIS — I2510 Atherosclerotic heart disease of native coronary artery without angina pectoris: Secondary | ICD-10-CM | POA: Diagnosis not present

## 2015-12-18 DIAGNOSIS — Z800 Family history of malignant neoplasm of digestive organs: Secondary | ICD-10-CM | POA: Diagnosis not present

## 2015-12-18 MED ORDER — NA SULFATE-K SULFATE-MG SULF 17.5-3.13-1.6 GM/177ML PO SOLN: ORAL | 0 refills | Status: DC

## 2015-12-18 NOTE — Progress Notes (Signed)
Patient ID: [name] [name], male   DOB: [birth year], 75 y.o.   MRN: [id number] HPI: [name] [name] is a 75 year old male with a family history of colon cancer, CAD on Plavix, hypertension, hyperlipidemia and hypothyroidism who is here to discuss surveillance/screening colonoscopy. He is here alone today. He reports that he has been feeling well. He denies specific complaint including recent change in bowel habit, diarrhea or constipation. No abdominal pain. Good appetite. He denies heartburn, dysphagia and odynophagia. No early satiety or dramatic changes in weight. He reports his weight fluctuates 10 pounds up and down throughout the year. He does not use tobacco. His brother had colon cancer and died of the same. He works as a Engineer, maintenance (IT). His last colonoscopy was with Dr. Sharlett Iles in May 2012. This revealed moderate left-sided diverticulosis and a diminutive polyp removed at the splenic flexure. This was not retrieved.  Past Medical History:  Diagnosis Date  . Allergic rhinitis   . CAD (coronary artery disease)   . CERUMEN IMPACTION 06/04/2009   5/15    . Colon polyp   . Diverticulosis   . Family hx of colon cancer    brother  . Hyperlipemia   . Hypertension   . Hypothyroidism   . MI (myocardial infarction) ([place])   . OSA (obstructive sleep apnea)   . Psoriasis     Past Surgical History:  Procedure Laterality Date  . CARDIAC CATHETERIZATION    . PTCA    . TONSILLECTOMY      Outpatient Medications Prior to Visit  Medication Sig Dispense Refill  . aspirin 81 MG tablet Take 81 mg by mouth daily.      . cholecalciferol (VITAMIN D) 1000 UNITS tablet Take 1,000 Units by mouth daily.    . clopidogrel (PLAVIX) 75 MG tablet TAKE 1 TABLET DAILY 90 tablet 2  . levothyroxine (SYNTHROID, LEVOTHROID) 100 MCG tablet TAKE 1 TABLET DAILY 90 tablet 1  . losartan (COZAAR) 100 MG tablet Take 1 tablet (100 mg total) by mouth daily. 90 tablet 3  . metoprolol succinate (TOPROL-XL) 25 MG 24 hr tablet Take 1  tablet (25 mg total) by mouth daily. 90 tablet 3  . Multiple Vitamins-Minerals (CENTRUM SILVER PO) Take 1 tablet by mouth daily.      . niacin (NIASPAN) 1000 MG CR tablet TAKE 2 TABLETS AT BEDTIME 180 tablet 1  . nitroGLYCERIN (NITROSTAT) 0.4 MG SL tablet Place 1 tablet (0.4 mg total) under the tongue every 5 (five) minutes as needed for chest pain (3 doses MAX). 25 tablet 4  . Omega-3 Fatty Acids (FISH OIL) 1200 MG CAPS Take 1 capsule by mouth daily.     . simvastatin (ZOCOR) 40 MG tablet Take 1 tablet (40 mg total) by mouth daily. 90 tablet 3  . neomycin-polymyxin-hydrocortisone (CORTISPORIN) otic solution Place 4 drops into the right ear 4 (four) times daily. 10 mL 0   No facility-administered medications prior to visit.     No Known Allergies  Family History  Problem Relation Age of Onset  . Colon cancer Brother     died 32  . Cancer Brother     bone cancer  . Coronary artery disease Mother   . Heart disease Mother   . Colon polyps Sister     x2  . COPD Father   . Colon cancer Paternal Grandfather   . Hypertension    . Colon cancer    . Colon cancer Maternal Aunt   . Colon cancer Maternal Uncle   .  Colon cancer Maternal Aunt   . Colon cancer Paternal Aunt     Social History  Substance Use Topics  . Smoking status: Former Research scientist (life sciences)  . Smokeless tobacco: Never Used  . Alcohol use 3.6 oz/week    6 Cans of beer per week    ROS: As per history of present illness, otherwise negative  BP 130/74   Pulse 62   Ht 6\' 1"  (1.854 m)   Wt 210 lb (95.3 kg)   BMI 27.71 kg/m  Constitutional: Well-developed and well-nourished. No distress. HEENT: Normocephalic and atraumatic. Oropharynx is clear and moist. No oropharyngeal exudate. Conjunctivae are normal.  No scleral icterus. Neck: Neck supple. Trachea midline. Cardiovascular: Normal rate, regular rhythm and intact distal pulses. No M/R/G Pulmonary/chest: Effort normal and breath sounds normal. No wheezing, rales or  rhonchi. Abdominal: Soft, nontender, nondistended. Bowel sounds active throughout. There are no masses palpable. Extremities: no clubbing, cyanosis, or edema Lymphadenopathy: No cervical adenopathy noted. Neurological: Alert and oriented to person place and time. Skin: Skin is warm and dry. No rashes noted. Psychiatric: Normal mood and affect. Behavior is normal.  RELEVANT LABS AND IMAGING: CBC    Component Value Date/Time   WBC 8.3 10/15/2015 1027   RBC 4.68 10/15/2015 1027   HGB 13.8 10/15/2015 1027   HCT 41.3 10/15/2015 1027   PLT 198.0 10/15/2015 1027   MCV 88.5 10/15/2015 1027   MCV 91.3 11/16/2013 1906   MCH 30.4 03/10/2014 1608   MCHC 33.4 10/15/2015 1027   RDW 14.4 10/15/2015 1027   LYMPHSABS 2.3 10/15/2015 1027   MONOABS 0.7 10/15/2015 1027   EOSABS 0.3 10/15/2015 1027   BASOSABS 0.0 10/15/2015 1027    CMP     Component Value Date/Time   NA 140 10/15/2015 1027   K 4.0 10/15/2015 1027   CL 106 10/15/2015 1027   CO2 26 10/15/2015 1027   GLUCOSE 98 10/15/2015 1027   GLUCOSE 91 02/14/2006 0824   BUN 10 10/15/2015 1027   CREATININE 0.91 10/15/2015 1027   CALCIUM 9.6 10/15/2015 1027   PROT 6.7 10/15/2015 1027   ALBUMIN 4.4 10/15/2015 1027   AST 22 10/15/2015 1027   ALT 21 10/15/2015 1027   ALKPHOS 38 (L) 10/15/2015 1027   BILITOT 0.9 10/15/2015 1027   GFRNONAA 82 (L) 03/10/2014 1608   GFRAA >90 03/10/2014 1608    ASSESSMENT/PLAN: 75 year old male with a family history of colon cancer, CAD on Plavix, hypertension, hyperlipidemia and hypothyroidism who is here to discuss surveillance/screening colonoscopy.  1. High risk CRC screening/history of polyp (unknown type) -- repeat colonoscopy recommended now given that his last was 5 years ago. We discussed the risk, benefit and alternatives and he wishes to proceed. Hold Plavix 5 days before procedure - will instruct when and how to resume after procedure. Risks and benefits of procedure including bleeding,  perforation, infection, missed lesions, medication reactions and possible hospitalization or surgery if complications occur explained. Additional rare but real risk of cardiovascular event such as heart attack or ischemia/infarct of other organs off  Plavix explained and need to seek urgent help if this occurs. Will communicate by phone or EMR with patient's prescribing provider that to confirm holding Plavix is reasonable in this case.      [id number] V Plotnikov, Md [address] [place],  [address]

## 2015-12-18 NOTE — Telephone Encounter (Signed)
12/18/2015  RE: SAHAS BERRIEN DOB: 1940/09/30 MRN: [id number]  Dear Dr Johnsie Cancel,   We have scheduled the above patient for an endoscopic procedure. Our records show that he is on anticoagulation therapy.   Please advise at to whether the patient may come off his therapy of Plavix 5 days prior to the procedure, which is scheduled for 02/19/16.  Please route your response to Dixon Boos, CMA  Sincerely,  Dixon Boos

## 2015-12-18 NOTE — Telephone Encounter (Signed)
Left voicemail for patient to call back. 

## 2015-12-18 NOTE — Telephone Encounter (Signed)
Ok to hold plavix for 5 days 

## 2015-12-18 NOTE — Patient Instructions (Signed)
You have been scheduled for a colonoscopy. Please follow written instructions given to you at your visit today.  Please pick up your prep supplies at the pharmacy within the next 1-3 days. If you use inhalers (even only as needed), please bring them with you on the day of your procedure. Your physician has requested that you go to www.startemmi.com and enter the access code given to you at your visit today. This web site gives a general overview about your procedure. However, you should still follow specific instructions given to you by our office regarding your preparation for the procedure.  You will be contacted by our office prior to your procedure for directions on holding your Plavix.  If you do not hear from our office 1 week prior to your scheduled procedure, please call [phone] to discuss.   If you are age 75 or older, your body mass index should be between 23-30. Your Body mass index is 27.71 kg/m. If this is out of the aforementioned range listed, please consider follow up with your Primary Care Provider.  If you are age 59 or younger, your body mass index should be between 19-25. Your Body mass index is 27.71 kg/m. If this is out of the aformentioned range listed, please consider follow up with your Primary Care Provider.

## 2015-12-18 NOTE — Telephone Encounter (Signed)
Patient returned phone call. Best # [phone]

## 2015-12-19 NOTE — Telephone Encounter (Signed)
Per Dr. Johnsie Cancel patient can hold Plavix 5 days prior to his procedure. Patient notified and patient verbalized understanding.

## 2016-02-09 NOTE — Progress Notes (Signed)
Patient ID: [name] [name], male   DOB: [birth year], 75 y.o.   MRN: [id number] [name] [name] is  75 y.o. previously seen by Dr Olevia Perches. [name] He has documented coronary disease and has chronic occlusion of the right coronary had cutting balloon angioplasty to the diagonal branch of the LAD. This occluded in 2008. F/U cath for abnormal ETT 11/11 showed occluded RCA and D1 with 70% mid LAD that Dr Olevia Perches decided to manage medically.  He has been doing fairly well   Myovue 04/12/12 low risk  Overall Impression: There is mild ischemia at the base and mid inferior wall. There is normal wall motion. This is a low risk scan.  LV Ejection Fraction: 69%. LV Wall Motion: Normal Wall Motion.  Mild exertional dyspnea BP seems up but he is not taking BP pill that Dr Laurian Brim suggested   ETT 05/20/13 with 10 mintues exercise and positive ECG However he has no angina and had specific symptoms of throat tightness and bad indigestion  Before all his interventions. He does not feel he needs a cath " I know my body"   No chest pain Needs nitro  Tax season very stressful for him and he may retire  Son in [place] will be ordained Catholic priest in January  Other son is a Chief Executive Officer in [place]   ROS: Denies fever, malais, weight loss, blurry vision, decreased visual acuity, cough, sputum, SOB, hemoptysis, pleuritic pain, palpitaitons, heartburn, abdominal pain, melena, lower extremity edema, claudication, or rash.  All other systems reviewed and negative  General: Affect appropriate Healthy:  appears stated age 18: normal Neck supple with no adenopathy JVP normal no bruits no thyromegaly Lungs clear with no wheezing and good diaphragmatic motion Heart:  S1/S2 no murmur, no rub, gallop or click PMI normal Abdomen: benighn, BS positve, no tenderness, no AAA no bruit.  No HSM or HJR Distal pulses intact with no bruits No edema Neuro non-focal Skin warm and dry No muscular weakness   Current Outpatient  Prescriptions  Medication Sig Dispense Refill  . aspirin 81 MG tablet Take 81 mg by mouth daily.      . cholecalciferol (VITAMIN D) 1000 UNITS tablet Take 1,000 Units by mouth daily.    . clopidogrel (PLAVIX) 75 MG tablet Take 75 mg by mouth daily.    [name] losartan (COZAAR) 100 MG tablet Take 1 tablet (100 mg total) by mouth daily. 90 tablet 3  . metoprolol succinate (TOPROL-XL) 25 MG 24 hr tablet Take 1 tablet (25 mg total) by mouth daily. 90 tablet 3  . Multiple Vitamins-Minerals (CENTRUM SILVER PO) Take 1 tablet by mouth daily.      . niacin (NIASPAN) 1000 MG CR tablet Take 2,000 mg by mouth at bedtime.    . nitroGLYCERIN (NITROSTAT) 0.4 MG SL tablet Place 1 tablet (0.4 mg total) under the tongue every 5 (five) minutes as needed for chest pain (3 doses MAX). 25 tablet 4  . Omega-3 Fatty Acids (FISH OIL) 1200 MG CAPS Take 1 capsule by mouth daily.     . simvastatin (ZOCOR) 40 MG tablet Take 1 tablet (40 mg total) by mouth daily. 90 tablet 3  . SYNTHROID 100 MCG tablet Take 100 mcg by mouth daily.     No current facility-administered medications for this visit.     Allergies  Review of patient's allergies indicates no known allergies.  Electrocardiogram:  SR LAFB RBBB  10/09/13  01/23/15  NSR rate 60 LAD RBBB LVH no change  Assessment and Plan CAD: stable no nitro on good medical Rx new nitro called in f/u exercise myovue January will Be 3 years given known residual dx HTN: Well controlled.  Continue current medications and low sodium Dash type diet.   Thyroid   Lab Results  Component Value Date   TSH 4.29 10/15/2015   Chol:  Lab Results  Component Value Date   LDLCALC 70 10/15/2015   FU with me in  6 months

## 2016-02-13 ENCOUNTER — Encounter: Payer: Self-pay | Admitting: Cardiovascular Disease

## 2016-02-13 ENCOUNTER — Ambulatory Visit (INDEPENDENT_AMBULATORY_CARE_PROVIDER_SITE_OTHER): Payer: Medicare Other | Admitting: Cardiovascular Disease

## 2016-02-13 VITALS — BP 150/70 | HR 67 | Ht 73.0 in | Wt 211.4 lb

## 2016-02-13 DIAGNOSIS — I2510 Atherosclerotic heart disease of native coronary artery without angina pectoris: Secondary | ICD-10-CM | POA: Diagnosis not present

## 2016-02-13 NOTE — Patient Instructions (Addendum)
Medication Instructions:  Your physician recommends that you continue on your current medications as directed. Please refer to the Current Medication list given to you today.  Labwork: NONE  Testing/Procedures: Your physician has requested that you have en exercise stress myoview. For further information please visit www.cardiosmart.org. Please follow instruction sheet, as given.  Follow-Up: Your physician wants you to follow-up in: 6 months with Dr. Nishan. You will receive a reminder letter in the mail two months in advance. If you don't receive a letter, please call our office to schedule the follow-up appointment.   If you need a refill on your cardiac medications before your next appointment, please call your pharmacy.   

## 2016-02-19 ENCOUNTER — Encounter: Payer: Self-pay | Admitting: Internal Medicine

## 2016-02-19 ENCOUNTER — Ambulatory Visit (AMBULATORY_SURGERY_CENTER): Payer: Medicare Other | Admitting: Internal Medicine

## 2016-02-19 VITALS — BP 140/68 | HR 56 | Temp 97.3°F | Resp 11 | Ht 73.0 in | Wt 210.0 lb

## 2016-02-19 DIAGNOSIS — Z1211 Encounter for screening for malignant neoplasm of colon: Secondary | ICD-10-CM | POA: Diagnosis not present

## 2016-02-19 DIAGNOSIS — Z86010 Personal history of colonic polyps: Secondary | ICD-10-CM | POA: Diagnosis not present

## 2016-02-19 DIAGNOSIS — D124 Benign neoplasm of descending colon: Secondary | ICD-10-CM | POA: Diagnosis not present

## 2016-02-19 DIAGNOSIS — D123 Benign neoplasm of transverse colon: Secondary | ICD-10-CM

## 2016-02-19 DIAGNOSIS — Z800 Family history of malignant neoplasm of digestive organs: Secondary | ICD-10-CM | POA: Diagnosis not present

## 2016-02-19 DIAGNOSIS — D125 Benign neoplasm of sigmoid colon: Secondary | ICD-10-CM

## 2016-02-19 MED ORDER — SODIUM CHLORIDE 0.9 % IV SOLN: 500.0000 mL | INTRAVENOUS | Status: DC

## 2016-02-19 NOTE — Progress Notes (Signed)
No egg or soy allergy known to patient  No issues with past sedation with any surgeries  or procedures, no intubation problems  No diet pills per patient No home 02 use per patient  No blood thinners per patient  Pt denies issues with constipation  No A fib or A flutter   

## 2016-02-19 NOTE — Patient Instructions (Signed)
YOU HAD AN ENDOSCOPIC PROCEDURE TODAY AT [facility]:   Refer to the procedure report that was given to you for any specific questions about what was found during the examination.  If the procedure report does not answer your questions, please call your gastroenterologist to clarify.  If you requested that your care partner not be given the details of your procedure findings, then the procedure report has been included in a sealed envelope for you to review at your convenience later.  YOU SHOULD EXPECT: Some feelings of bloating in the abdomen. Passage of more gas than usual.  Walking can help get rid of the air that was put into your GI tract during the procedure and reduce the bloating. If you had a lower endoscopy (such as a colonoscopy or flexible sigmoidoscopy) you may notice spotting of blood in your stool or on the toilet paper. If you underwent a bowel prep for your procedure, you may not have a normal bowel movement for a few days.  Please Note:  You might notice some irritation and congestion in your nose or some drainage.  This is from the oxygen used during your procedure.  There is no need for concern and it should clear up in a day or so.  SYMPTOMS TO REPORT IMMEDIATELY:   Following lower endoscopy (colonoscopy or flexible sigmoidoscopy):  Excessive amounts of blood in the stool  Significant tenderness or worsening of abdominal pains  Swelling of the abdomen that is new, acute  Fever of 100F or higher    For urgent or emergent issues, a gastroenterologist can be reached at any hour by calling [phone].   DIET:  We do recommend a small meal at first, but then you may proceed to your regular diet.  Drink plenty of fluids but you should avoid alcoholic beverages for 24 hours.  ACTIVITY:  You should plan to take it easy for the rest of today and you should NOT DRIVE or use heavy machinery until tomorrow (because of the sedation medicines used during the test).     FOLLOW UP: Our staff will call the number listed on your records the next business day following your procedure to check on you and address any questions or concerns that you may have regarding the information given to you following your procedure. If we do not reach you, we will leave a message.  However, if you are feeling well and you are not experiencing any problems, there is no need to return our call.  We will assume that you have returned to your regular daily activities without incident.  If any biopsies were taken you will be contacted by phone or by letter within the next 1-3 weeks.  Please call us at [phone] if you have not heard about the biopsies in 3 weeks.    SIGNATURES/CONFIDENTIALITY: You and/or your care partner have signed paperwork which will be entered into your electronic medical record.  These signatures attest to the fact that that the information above on your After Visit Summary has been reviewed and is understood.  Full responsibility of the confidentiality of this discharge information lies with you and/or your care-partner.   Information on polyps   Resume Plavix tomorrow.!0/27/2017

## 2016-02-19 NOTE — Op Note (Signed)
[facility] [place] Patient Name: [name] [name] Procedure Date: 02/19/2016 8:02 AM MRN: [id number] Endoscopist: Jerene Bears , MD Age: 75 Referring MD:  Date of Birth: 1940/09/10 Gender: Male Account #: [id number] Procedure:                Colonoscopy Indications:              High risk colon cancer surveillance: Personal                            history of colonic polyps, Family history of colon                            cancer in a first-degree relative, Last                            colonoscopy: 2012 Medicines:                Monitored Anesthesia Care Procedure:                Pre-Anesthesia Assessment:                           - Prior to the procedure, a History and Physical                            was performed, and patient medications and                            allergies were reviewed. The patient's tolerance of                            previous anesthesia was also reviewed. The risks                            and benefits of the procedure and the sedation                            options and risks were discussed with the patient.                            All questions were answered, and informed consent                            was obtained. Prior Anticoagulants: The patient has                            taken Plavix (clopidogrel), last dose was 5 days                            prior to procedure. ASA Grade Assessment: III - A                            patient with severe systemic disease. After  reviewing the risks and benefits, the patient was                            deemed in satisfactory condition to undergo the                            procedure.                           After obtaining informed consent, the colonoscope                            was passed under direct vision. Throughout the                            procedure, the patient's blood pressure, pulse, and                            oxygen  saturations were monitored continuously. The                            Model CF-HQ190L [id number]) scope was introduced                            through the anus and advanced to the the cecum,                            identified by appendiceal orifice and ileocecal                            valve. The colonoscopy was performed without                            difficulty. The patient tolerated the procedure                            well. The quality of the bowel preparation was                            good. The ileocecal valve, appendiceal orifice, and                            rectum were photographed. Scope In: 8:08:21 AM Scope Out: 8:25:55 AM Scope Withdrawal Time: 0 hours 12 minutes 59 seconds  Total Procedure Duration: 0 hours 17 minutes 34 seconds  Findings:                 The digital rectal exam was normal.                           A 5 mm polyp was found in the transverse colon. The                            polyp was sessile. The polyp was removed with a  cold snare. Resection and retrieval were complete.                           A 5 mm polyp was found in the descending colon. The                            polyp was sessile. The polyp was removed with a                            cold snare. Resection and retrieval were complete.                           Two sessile polyps were found in the sigmoid colon.                            The polyps were 4 to 6 mm in size. These polyps                            were removed with a cold snare. Resection and                            retrieval were complete.                           Internal hemorrhoids were found during                            retroflexion. The hemorrhoids were small. Complications:            No immediate complications. Estimated Blood Loss:     Estimated blood loss was minimal. Impression:               - One 5 mm polyp in the transverse colon, removed                             with a cold snare. Resected and retrieved.                           - One 5 mm polyp in the descending colon, removed                            with a cold snare. Resected and retrieved.                           - Two 4 to 6 mm polyps in the sigmoid colon,                            removed with a cold snare. Resected and retrieved.                           - Internal hemorrhoids. Recommendation:           - Patient has a contact number available for  emergencies. The signs and symptoms of potential                            delayed complications were discussed with the                            patient. Return to normal activities tomorrow.                            Written discharge instructions were provided to the                            patient.                           - Resume previous diet.                           - Continue present medications.                           - Resume Plavix (clopidogrel) at prior dose                            tomorrow.                           - Await pathology results.                           - Repeat colonoscopy is recommended for                            surveillance. The colonoscopy date will be                            determined after pathology results from today's                            exam become available for review. Jerene Bears, MD 02/19/2016 8:30:35 AM This report has been signed electronically.

## 2016-02-19 NOTE — Progress Notes (Signed)
Called to room to assist during endoscopic procedure.  Patient ID and intended procedure confirmed with present staff. Received instructions for my participation in the procedure from the performing physician.  

## 2016-02-19 NOTE — Progress Notes (Signed)
A and O x3. Report to RN. Tolerated MAC anesthesia well. 

## 2016-02-20 ENCOUNTER — Telehealth: Payer: Self-pay

## 2016-02-20 NOTE — Telephone Encounter (Signed)
  Follow up Call-  Call back number 02/19/2016  Post procedure Call Back phone  # [phone]  Permission to leave phone message Yes  Some recent data might be hidden     Patient questions:  Do you have a fever, pain , or abdominal swelling? No. Pain Score  0 *  Have you tolerated food without any problems? Yes.    Have you been able to return to your normal activities? Yes.    Do you have any questions about your discharge instructions: Diet   No. Medications  No. Follow up visit  No.  Do you have questions or concerns about your Care? No.  Actions: * If pain score is 4 or above: No action needed, pain <4.

## 2016-02-21 ENCOUNTER — Ambulatory Visit (INDEPENDENT_AMBULATORY_CARE_PROVIDER_SITE_OTHER): Payer: Medicare Other | Admitting: Family Medicine

## 2016-02-21 VITALS — BP 132/68 | HR 61 | Temp 98.2°F | Resp 16 | Wt 210.0 lb

## 2016-02-21 DIAGNOSIS — I2510 Atherosclerotic heart disease of native coronary artery without angina pectoris: Secondary | ICD-10-CM

## 2016-02-21 DIAGNOSIS — R239 Unspecified skin changes: Secondary | ICD-10-CM

## 2016-02-21 DIAGNOSIS — E663 Overweight: Secondary | ICD-10-CM

## 2016-02-21 NOTE — Progress Notes (Signed)
Pre visit review using our clinic review tool, if applicable. No additional management support is needed unless otherwise documented below in the visit note. 

## 2016-02-21 NOTE — Patient Instructions (Addendum)
If you start having pus draining from your arm, streaking redness, fevers, or start having increasing pain, seek care or let your Dr know.  I want your cream to have bacitracin in it. Triple antibiotic ointment has it in it. Twice daily until resolution is sufficient.

## 2016-02-21 NOTE — Progress Notes (Signed)
Chief Complaint  Patient presents with  . [place] into a bush 6 days ago, hot to the touch, thinks a piece of the plant is in there.     Subjective: Patient is a 75 y.o. male here for a scrape on his arm.  6 days ago, pt was outside doing yardwork and fell onto a bush. His R upper inner arm was punctured by a branch. He has been putting an OTC ointment on it and bandaging it daily. He feels as though there is a piece of the plant in his arm. There is no pus draining from it, he has minimal pain, no redness, and he denies fevers. He is unsure of when his last tetanus shot was.  ROS: Const: No fevers Skin: as noted in HPI  Family History  Problem Relation Age of Onset  . Colon cancer Brother     died 1  . Cancer Brother     bone cancer  . Coronary artery disease Mother   . Heart disease Mother   . Colon polyps Sister     x2  . COPD Father   . Colon cancer Paternal Grandfather   . Hypertension    . Colon cancer    . Colon cancer Maternal Aunt   . Colon cancer Maternal Uncle   . Colon cancer Maternal Aunt   . Colon cancer Paternal Aunt   . Esophageal cancer Neg Hx   . Rectal cancer Neg Hx   . Stomach cancer Neg Hx    Past Medical History:  Diagnosis Date  . Allergic rhinitis   . Allergy   . CAD (coronary artery disease)   . Cataract   . CERUMEN IMPACTION 06/04/2009   5/15    . Colon polyp   . Diverticulosis   . Family hx of colon cancer    brother  . Hyperlipemia   . Hypertension   . Hypothyroidism   . MI (myocardial infarction) 1993  . OSA (obstructive sleep apnea)   . Psoriasis   . Sleep apnea    no cpap- had sleep study per pt no OSA   No Known Allergies  Current Outpatient Prescriptions:  .  aspirin 81 MG tablet, Take 81 mg by mouth daily.  , Disp: , Rfl:  .  cholecalciferol (VITAMIN D) 1000 UNITS tablet, Take 1,000 Units by mouth daily., Disp: , Rfl:  .  clopidogrel (PLAVIX) 75 MG tablet, Take 75 mg by mouth daily., Disp: , Rfl:  .  losartan  (COZAAR) 100 MG tablet, Take 1 tablet (100 mg total) by mouth daily., Disp: 90 tablet, Rfl: 3 .  metoprolol succinate (TOPROL-XL) 25 MG 24 hr tablet, Take 1 tablet (25 mg total) by mouth daily., Disp: 90 tablet, Rfl: 3 .  Multiple Vitamins-Minerals (CENTRUM SILVER PO), Take 1 tablet by mouth daily.  , Disp: , Rfl:  .  niacin (NIASPAN) 1000 MG CR tablet, Take 2,000 mg by mouth at bedtime., Disp: , Rfl:  .  nitroGLYCERIN (NITROSTAT) 0.4 MG SL tablet, Place 1 tablet (0.4 mg total) under the tongue every 5 (five) minutes as needed for chest pain (3 doses MAX)., Disp: 25 tablet, Rfl: 4 .  Omega-3 Fatty Acids (FISH OIL) 1200 MG CAPS, Take 1 capsule by mouth daily. , Disp: , Rfl:  .  simvastatin (ZOCOR) 40 MG tablet, Take 1 tablet (40 mg total) by mouth daily., Disp: 90 tablet, Rfl: 3 .  SYNTHROID 100 MCG tablet, Take 100 mcg by mouth  daily., Disp: , Rfl:   Current Facility-Administered Medications:  .  0.9 %  sodium chloride infusion, 500 mL, Intravenous, Continuous, Jerene Bears, MD  Objective: BP 132/68   Pulse 61   Temp 98.2 F (36.8 C) (Oral)   Resp 16   Wt 210 lb (95.3 kg)   SpO2 98%   BMI 27.71 kg/m  General: Awake, appears stated age Heart: RRR, no murmurs Lungs: CTAB, no rales, wheezes or rhonchi. No accessory muscle use Skin: 3 mm puncture with adequate healing on the RUE. Some associated tissue edema centrally. No TTP, redness, pus, or fluctuance appreciated. Some serosanguinous material draining from center. Psych: Age appropriate judgment and insight, normal affect and mood  Assessment and Plan: Skin complaints  Overweight (BMI 25.0-29.9)  Uninfected puncture wound with routine healing. Explained to patient that this will take a little longer to heal than a routine scrape. Even if there is a FB in his arm, his body will heal around it or expel it. I do not appreciate any signs of infection, but did give him warning signs of infection both verbally and in his AVS. Continue using  abx ointment twice daily and keep the wound clean. His last tetanus was in 2013 so he does not need a booster. F/u with PCP prn. The patient voiced understanding and agreement to the plan.  [place], DO 02/21/16  9:39 AM

## 2016-02-26 ENCOUNTER — Encounter: Payer: Self-pay | Admitting: Internal Medicine

## 2016-03-11 ENCOUNTER — Other Ambulatory Visit: Payer: Self-pay | Admitting: Cardiovascular Disease

## 2016-03-11 NOTE — Telephone Encounter (Signed)
Pharmacy requesting refill on levothyroxine 100 mcg. Would you like to refill this medication? Please advise

## 2016-03-15 ENCOUNTER — Encounter: Payer: Self-pay | Admitting: Internal Medicine

## 2016-03-16 ENCOUNTER — Other Ambulatory Visit: Payer: Self-pay | Admitting: *Deleted

## 2016-03-16 MED ORDER — SYNTHROID 100 MCG PO TABS: 100.0000 ug | ORAL_TABLET | Freq: Every day | ORAL | 2 refills | Status: DC

## 2016-03-16 NOTE — Telephone Encounter (Signed)
Pt requesting refill on Synthroid. Refill sent. See meds. Pt informed via MyChart.

## 2016-03-25 ENCOUNTER — Telehealth: Payer: Self-pay | Admitting: Internal Medicine

## 2016-03-25 NOTE — Telephone Encounter (Signed)
Pt wife called in and said that [facility].  She said that pt always get generic and wanted to know why that was call in?

## 2016-03-28 MED ORDER — LEVOTHYROXINE SODIUM 100 MCG PO TABS: 100.0000 ug | ORAL_TABLET | Freq: Every day | ORAL | 2 refills | Status: DC

## 2016-03-28 NOTE — Telephone Encounter (Signed)
I'm not sure. Will change to generic Thx

## 2016-03-29 NOTE — Telephone Encounter (Signed)
Pts wife informed.

## 2016-04-06 DIAGNOSIS — H02831 Dermatochalasis of right upper eyelid: Secondary | ICD-10-CM | POA: Diagnosis not present

## 2016-04-06 DIAGNOSIS — H10413 Chronic giant papillary conjunctivitis, bilateral: Secondary | ICD-10-CM | POA: Diagnosis not present

## 2016-04-06 DIAGNOSIS — D3132 Benign neoplasm of left choroid: Secondary | ICD-10-CM | POA: Diagnosis not present

## 2016-04-06 DIAGNOSIS — M279 Disease of jaws, unspecified: Secondary | ICD-10-CM | POA: Diagnosis not present

## 2016-04-06 DIAGNOSIS — H02834 Dermatochalasis of left upper eyelid: Secondary | ICD-10-CM | POA: Diagnosis not present

## 2016-04-06 DIAGNOSIS — H25813 Combined forms of age-related cataract, bilateral: Secondary | ICD-10-CM | POA: Diagnosis not present

## 2016-04-14 MED ORDER — LEVOTHYROXINE SODIUM 100 MCG PO TABS: 100.0000 ug | ORAL_TABLET | Freq: Every day | ORAL | 2 refills | Status: DC

## 2016-04-14 NOTE — Telephone Encounter (Signed)
Resent rx for [facility]othyroxine [facility]...Johny Chess

## 2016-04-14 NOTE — Addendum Note (Signed)
Addended by: Earnstine Regal on: 04/14/2016 11:19 AM   Modules accepted: Orders

## 2016-04-14 NOTE — Telephone Encounter (Signed)
Patients wife calling about this again. They did not get the rx can you send it again. Thank you.

## 2016-04-15 NOTE — Telephone Encounter (Signed)
Patient wife called back and said he wanted the generic band.

## 2016-04-23 DIAGNOSIS — M279 Disease of jaws, unspecified: Secondary | ICD-10-CM | POA: Diagnosis not present

## 2016-04-28 ENCOUNTER — Telehealth (HOSPITAL_COMMUNITY): Payer: Self-pay | Admitting: *Deleted

## 2016-04-28 NOTE — Telephone Encounter (Signed)
Patient given detailed instructions per Myocardial Perfusion Study Information Sheet for the test on 05/04/15 at 0730. Patient notified to arrive 15 minutes early and that it is imperative to arrive on time for appointment to keep from having the test rescheduled.  If you need to cancel or reschedule your appointment, please call the office within 24 hours of your appointment. Failure to do so may result in a cancellation of your appointment, and a $50 no show fee. Patient verbalized understanding.Rajanee Schuelke, Ranae Palms

## 2016-05-03 ENCOUNTER — Ambulatory Visit (HOSPITAL_COMMUNITY): Payer: Medicare Other | Attending: Cardiology

## 2016-05-03 DIAGNOSIS — I2510 Atherosclerotic heart disease of native coronary artery without angina pectoris: Secondary | ICD-10-CM | POA: Diagnosis not present

## 2016-05-03 DIAGNOSIS — R079 Chest pain, unspecified: Secondary | ICD-10-CM | POA: Insufficient documentation

## 2016-05-03 DIAGNOSIS — E785 Hyperlipidemia, unspecified: Secondary | ICD-10-CM | POA: Diagnosis not present

## 2016-05-03 DIAGNOSIS — I10 Essential (primary) hypertension: Secondary | ICD-10-CM | POA: Diagnosis not present

## 2016-05-03 LAB — MYOCARDIAL PERFUSION IMAGING
CHL CUP MPHR: 145 {beats}/min
CHL CUP NUCLEAR SSS: 6
CSEPEDS: 0 s
CSEPEW: 11.7 METS
Exercise duration (min): 10 min
LHR: 0.11
LV dias vol: 107 mL (ref 62–150)
LVSYSVOL: 38 mL
NUC STRESS TID: 1.04
Peak HR: 144 {beats}/min
Percent HR: 99 %
Rest HR: 57 {beats}/min
SDS: 3
SRS: 3

## 2016-05-03 MED ORDER — TECHNETIUM TC 99M TETROFOSMIN IV KIT
10.3000 | PACK | Freq: Once | INTRAVENOUS | Status: AC | PRN
  Administered 2016-05-03: 10.3 via INTRAVENOUS
  Filled 2016-05-03: qty 11

## 2016-05-03 MED ORDER — TECHNETIUM TC 99M TETROFOSMIN IV KIT
32.3000 | PACK | Freq: Once | INTRAVENOUS | Status: AC | PRN
  Administered 2016-05-03: 32.3 via INTRAVENOUS
  Filled 2016-05-03: qty 33

## 2016-05-04 DIAGNOSIS — M279 Disease of jaws, unspecified: Secondary | ICD-10-CM | POA: Diagnosis not present

## 2016-05-05 ENCOUNTER — Telehealth: Payer: Self-pay | Admitting: Cardiovascular Disease

## 2016-05-05 NOTE — Telephone Encounter (Signed)
Left message for patient to call back  

## 2016-05-05 NOTE — Telephone Encounter (Signed)
Follow up   Pt returning phone call from [facility].

## 2016-05-07 NOTE — Telephone Encounter (Signed)
Notes Recorded by Josue Hector, MD on 05/05/2016 at 8:14 AM EST Old IMI no ischemia EF ok medical Rx  Left pt a message to call back to endorse stress test results per Dr Johnsie Cancel.

## 2016-05-07 NOTE — Telephone Encounter (Signed)
Follow up      Returning a call to the nurse to get stress test results

## 2016-05-07 NOTE — Telephone Encounter (Signed)
Patient called stress test results given.Advised to continue same medications.

## 2016-05-13 ENCOUNTER — Ambulatory Visit: Payer: Medicare Other

## 2016-05-14 ENCOUNTER — Ambulatory Visit (INDEPENDENT_AMBULATORY_CARE_PROVIDER_SITE_OTHER): Payer: Medicare Other | Admitting: General Practice

## 2016-05-14 DIAGNOSIS — Z23 Encounter for immunization: Secondary | ICD-10-CM | POA: Diagnosis not present

## 2016-05-20 NOTE — Progress Notes (Signed)
[name] [address] [place], [place] [address] Phone: [phone] Subjective:    I'm seeing this patient by the request  of:    CC: Wrist mass.  [id number]  [name] [name] is a 76 y.o. male coming in with complaint of patient is having more of a wrist mass again. Patient was found to have a ganglion cyst over the third flexor tendon previously. Patient had this done a year and a half ago. Patient states He was doing very well overall. Patient states is on the left side previously. Having worsening symptoms on the right side. States that it is very minimal discomfort. Nothing severe though. Patient though would like and on because it does affects some of his daily activities. Patient is having difficult with gripping but no locking.      Past Medical History:  Diagnosis Date  . Allergic rhinitis   . Allergy   . CAD (coronary artery disease)   . Cataract   . CERUMEN IMPACTION 06/04/2009   5/15    . Colon polyp   . Diverticulosis   . Family hx of colon cancer    brother  . Hyperlipemia   . Hypertension   . Hypothyroidism   . MI (myocardial infarction) 1993  . OSA (obstructive sleep apnea)   . Psoriasis   . Sleep apnea    no cpap- had sleep study per pt no OSA   Past Surgical History:  Procedure Laterality Date  . CARDIAC CATHETERIZATION    . COLONOSCOPY    . POLYPECTOMY    . PTCA    . TONSILLECTOMY     Social History   Social History  . Marital status: Married    Spouse name: N/A  . Number of children: 2  . Years of education: N/A   Occupational History  . 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    Social History Main Topics  . Smoking status: Former Smoker    Quit date: 04/1978  . Smokeless tobacco: Never Used  . Alcohol use 3.6 oz/week    6 Cans of beer per week  . Drug use: No  . Sexual activity: Not Currently   Other Topics Concern  . None   Social History Narrative  . None   No Known  Allergies Family History  Problem Relation Age of Onset  . Colon cancer Brother     died 18  . Cancer Brother     bone cancer  . Coronary artery disease Mother   . Heart disease Mother   . Colon polyps Sister     x2  . COPD Father   . Colon cancer Paternal Grandfather   . Hypertension    . Colon cancer    . Colon cancer Maternal Aunt   . Colon cancer Maternal Uncle   . Colon cancer Maternal Aunt   . Colon cancer Paternal Aunt   . Esophageal cancer Neg Hx   . Rectal cancer Neg Hx   . Stomach cancer Neg Hx     Past medical history, social, surgical and family history all reviewed in electronic medical record.  No pertanent information unless stated regarding to the chief complaint.   Review of Systems:Review of systems updated and as accurate as of 05/21/16  No headache, visual changes, nausea, vomiting, diarrhea, constipation, dizziness, abdominal pain, skin rash, fevers, chills, night sweats, weight loss, swollen lymph nodes, body aches, joint swelling, muscle aches, chest pain, shortness  of breath, mood changes.   Objective  Blood pressure (!) 150/72, pulse 66, height 6\' 1"  (1.854 m), weight 213 lb (96.6 kg).  Systems examined below as of 05/21/16 General: NAD A&O x3 mood, affect normal  HEENT: Pupils equal, extraocular movements intact no nystagmus Respiratory: not short of breath at rest or with speaking Cardiovascular: No lower extremity edema, non tender Skin: Warm dry intact with no signs of infection or rash on extremities or on axial skeleton. Abdomen: Soft nontender, no masses Neuro: Cranial nerves  intact, neurovascularly intact in all extremities with 2+ DTRs and 2+ pulses. Lymph: No lymphadenopathy appreciated today  Gait normal with good balance and coordination.  MSK: Non tender with full range of motion and good stability and symmetric strength and tone of shoulders, elbows, wrist,  knee hips and ankles bilaterally.    Patient's hand exam on the right side  shows the patient does have a palmar cyst noted. There is no triggering of any fingers noted. Neurovascular intact. Good grip strength. Full range of motion of the wrist. Contralateral hand left hand has one similar cyst was much smaller.  Procedure: Real-time Ultrasound Guided Injection of right palmar hand cyst Device: GE Logiq Q7 Ultrasound guided injection is preferred based studies that show increased duration, increased effect, greater accuracy, decreased procedural pain, increased response rate, and decreased cost with ultrasound guided versus blind injection.  Verbal informed consent obtained.  Time-out conducted.  Noted no overlying erythema, induration, or other signs of local infection.  Skin prepped in a sterile fashion.  Local anesthesia: Topical Ethyl chloride.  With sterile technique and under real time ultrasound guidance:  With a 21-gauge 1 inch needle patient was injected with a total of 0.5 mL of 0.5% Marcaine and 0.5 mL of Kenalog 40 mg/dL. Patient and had pressure in July substance came out of the area. Completed without difficulty  Pain immediately resolved suggesting accurate placement of the medication.  Advised to call if fevers/chills, erythema, induration, drainage, or persistent bleeding.  Images permanently stored and available for review in the ultrasound unit.  Impression: Technically successful ultrasound guided injection.     Impression and Recommendations:     This case required medical decision making of moderate complexity.      Note: This dictation was prepared with Dragon dictation along with smaller phrase technology. Any transcriptional errors that result from this process are unintentional.

## 2016-05-21 ENCOUNTER — Ambulatory Visit (INDEPENDENT_AMBULATORY_CARE_PROVIDER_SITE_OTHER): Payer: Medicare Other | Admitting: Family Medicine

## 2016-05-21 ENCOUNTER — Encounter: Payer: Self-pay | Admitting: Family Medicine

## 2016-05-21 ENCOUNTER — Ambulatory Visit: Payer: Self-pay

## 2016-05-21 VITALS — BP 150/72 | HR 66 | Ht 73.0 in | Wt 213.0 lb

## 2016-05-21 DIAGNOSIS — M6740 Ganglion, unspecified site: Secondary | ICD-10-CM | POA: Diagnosis not present

## 2016-05-21 DIAGNOSIS — I2510 Atherosclerotic heart disease of native coronary artery without angina pectoris: Secondary | ICD-10-CM

## 2016-05-21 DIAGNOSIS — M67442 Ganglion, left hand: Secondary | ICD-10-CM

## 2016-05-21 NOTE — Assessment & Plan Note (Signed)
Patient did very well with the injection today on the right side. We discussed icing regimen and home exercises. We discussed and it did not appear that this is within the tendon sheath. Likely will do well to conservative therapy. Follow-up again as needed.

## 2016-05-21 NOTE — Patient Instructions (Signed)
Good to see you  We injected the one hand and I hope it helps.  pennsaid pinkie amount topically 2 times daily as needed if it hurts or on your backside.  Lets watch but these little bumps are not in the tendon sheath so your should be fine. Do exercises for piriformis out of the [facility] book.  See me again in 1 month if anything is bothering you.

## 2016-07-02 ENCOUNTER — Other Ambulatory Visit: Payer: Self-pay | Admitting: *Deleted

## 2016-07-02 MED ORDER — LEVOTHYROXINE SODIUM 100 MCG PO TABS: 100.0000 ug | ORAL_TABLET | Freq: Every day | ORAL | 1 refills | Status: DC

## 2016-07-02 MED ORDER — METOPROLOL SUCCINATE ER 25 MG PO TB24: 25.0000 mg | ORAL_TABLET | Freq: Every day | ORAL | 1 refills | Status: DC

## 2016-07-08 DIAGNOSIS — D3132 Benign neoplasm of left choroid: Secondary | ICD-10-CM | POA: Diagnosis not present

## 2016-07-08 DIAGNOSIS — H02831 Dermatochalasis of right upper eyelid: Secondary | ICD-10-CM | POA: Diagnosis not present

## 2016-07-08 DIAGNOSIS — H02834 Dermatochalasis of left upper eyelid: Secondary | ICD-10-CM | POA: Diagnosis not present

## 2016-07-08 DIAGNOSIS — H25813 Combined forms of age-related cataract, bilateral: Secondary | ICD-10-CM | POA: Diagnosis not present

## 2016-07-20 ENCOUNTER — Telehealth: Payer: Self-pay | Admitting: Internal Medicine

## 2016-07-20 NOTE — Telephone Encounter (Signed)
Called patient to schedule awv. Patient did not answer. Will try to call patient again at a later time.

## 2016-07-29 ENCOUNTER — Encounter: Payer: Self-pay | Admitting: Cardiovascular Disease

## 2016-08-13 NOTE — Progress Notes (Signed)
Patient ID: [name] [name], male   DOB: 08-31-1940, 76 y.o.   MRN: [id number] [name] is  76 y.o. previously seen by Dr Olevia Perches. [name] He has documented coronary disease and has chronic occlusion of the right coronary had cutting balloon angioplasty to the diagonal branch of the LAD. This occluded in 2008. F/U cath for abnormal ETT 11/11 showed occluded RCA and D1 with 70% mid LAD that Dr Olevia Perches decided to manage medically.  He has been doing fairly well   Myovue 05/03/16 low risk  MPI images personally reviewed    Nuclear stress EF: 64%.  Blood pressure demonstrated a hypertensive response to exercise.  Upsloping ST segment depression ST segment depression of 2 mm was noted during stress in the V4 and V5 leads, beginning at 9 minutes of stress, and returning to baseline after less than 1 minute of recovery.  Defect 1: There is a small defect of moderate severity present in the basal inferior and mid inferior location consistent with probable soft tissue attenuation, cannot rule out subendicardial scar. No ischemia  This is a low risk study.  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  All other systems reviewed and negative  General: Affect appropriate Healthy:  appears stated age 3: normal Neck supple with no adenopathy JVP normal no bruits no thyromegaly Lungs clear with no wheezing and good diaphragmatic motion Heart:  S1/S2 no murmur, no rub, gallop or click PMI normal Abdomen: benighn, BS positve, no tenderness, no AAA no bruit.  No HSM or HJR Distal pulses intact with no bruits No edema Neuro non-focal Skin warm and dry No  muscular weakness   Current Outpatient Prescriptions  Medication Sig Dispense Refill  . aspirin 81 MG tablet Take 81 mg by mouth daily.      . cholecalciferol (VITAMIN D) 1000 UNITS tablet Take 1,000 Units by mouth daily.    . clopidogrel (PLAVIX) 75 MG tablet Take 75 mg by mouth daily.    [name] levothyroxine (SYNTHROID) 100 MCG tablet Take 1 tablet (100 mcg total) by mouth daily. Yearly physical due in June must see MD for refills 90 tablet 1  . losartan (COZAAR) 100 MG tablet Take 1 tablet (100 mg total) by mouth daily. 90 tablet 3  . metoprolol succinate (TOPROL-XL) 25 MG 24 hr tablet Take 1 tablet (25 mg total) by mouth daily. Yearly physical due in [place] must see MD for refills 90 tablet 1  . Multiple Vitamins-Minerals (CENTRUM SILVER PO) Take 1 tablet by mouth daily.      . niacin (NIASPAN) 1000 MG CR tablet Take 2,000 mg by mouth at bedtime.    . nitroGLYCERIN (NITROSTAT) 0.4 MG SL tablet Place 1 tablet (0.4 mg total) under the tongue every 5 (five) minutes as needed for chest pain (3 doses MAX). 25 tablet 4  . Omega-3 Fatty Acids (FISH OIL) 1200 MG CAPS Take 1 capsule by mouth daily.     . simvastatin (ZOCOR) 40 MG tablet Take 1 tablet (40 mg total) by mouth daily. 90 tablet 3   Current Facility-Administered Medications  Medication Dose Route Frequency Provider Last Rate Last Dose  . 0.9 %  sodium  chloride infusion  500 mL Intravenous Continuous Jerene Bears, MD        Allergies  Patient has no known allergies.  Electrocardiogram:  SR LAFB RBBB  10/09/13  01/23/15  NSR rate 60 LAD RBBB LVH no change   Assessment and Plan CAD: stable no nitro on good medical Rx new nitro called in   HTN: Well controlled.  Continue current medications and low sodium Dash type diet.   Thyroid   Lab Results  Component Value Date   TSH 4.29 10/15/2015   Chol:  Lab Results  Component Value Date   LDLCALC 70 10/15/2015   FU with me in  6 months

## 2016-08-16 ENCOUNTER — Ambulatory Visit (INDEPENDENT_AMBULATORY_CARE_PROVIDER_SITE_OTHER): Payer: Medicare Other | Admitting: Cardiovascular Disease

## 2016-08-16 ENCOUNTER — Encounter: Payer: Self-pay | Admitting: Cardiovascular Disease

## 2016-08-16 VITALS — BP 128/50 | HR 62 | Ht 73.0 in | Wt 201.8 lb

## 2016-08-16 DIAGNOSIS — I2510 Atherosclerotic heart disease of native coronary artery without angina pectoris: Secondary | ICD-10-CM

## 2016-08-16 NOTE — Patient Instructions (Signed)

## 2016-09-13 DIAGNOSIS — H25811 Combined forms of age-related cataract, right eye: Secondary | ICD-10-CM | POA: Diagnosis not present

## 2016-09-13 DIAGNOSIS — H2511 Age-related nuclear cataract, right eye: Secondary | ICD-10-CM | POA: Diagnosis not present

## 2016-09-15 DIAGNOSIS — H2512 Age-related nuclear cataract, left eye: Secondary | ICD-10-CM | POA: Diagnosis not present

## 2016-09-20 DIAGNOSIS — H2512 Age-related nuclear cataract, left eye: Secondary | ICD-10-CM | POA: Diagnosis not present

## 2016-09-20 DIAGNOSIS — H25012 Cortical age-related cataract, left eye: Secondary | ICD-10-CM | POA: Diagnosis not present

## 2016-09-24 ENCOUNTER — Encounter: Payer: Self-pay | Admitting: Internal Medicine

## 2016-09-24 ENCOUNTER — Ambulatory Visit (INDEPENDENT_AMBULATORY_CARE_PROVIDER_SITE_OTHER): Payer: Medicare Other | Admitting: Internal Medicine

## 2016-09-24 ENCOUNTER — Ambulatory Visit (INDEPENDENT_AMBULATORY_CARE_PROVIDER_SITE_OTHER)
Admission: RE | Admit: 2016-09-24 | Discharge: 2016-09-24 | Disposition: A | Discharge status: Home / Self Care | Payer: Medicare Other | Source: Ambulatory Visit | Attending: Internal Medicine | Admitting: Internal Medicine

## 2016-09-24 VITALS — BP 144/72 | HR 57 | Temp 97.7°F | Ht 73.0 in | Wt 202.0 lb

## 2016-09-24 DIAGNOSIS — N320 Bladder-neck obstruction: Secondary | ICD-10-CM | POA: Diagnosis not present

## 2016-09-24 DIAGNOSIS — I2510 Atherosclerotic heart disease of native coronary artery without angina pectoris: Secondary | ICD-10-CM | POA: Diagnosis not present

## 2016-09-24 DIAGNOSIS — M545 Low back pain, unspecified: Secondary | ICD-10-CM | POA: Insufficient documentation

## 2016-09-24 DIAGNOSIS — E785 Hyperlipidemia, unspecified: Secondary | ICD-10-CM

## 2016-09-24 DIAGNOSIS — M5440 Lumbago with sciatica, unspecified side: Secondary | ICD-10-CM | POA: Diagnosis not present

## 2016-09-24 DIAGNOSIS — I10 Essential (primary) hypertension: Secondary | ICD-10-CM | POA: Diagnosis not present

## 2016-09-24 MED ORDER — PREDNISONE 10 MG PO TABS: ORAL_TABLET | ORAL | 1 refills | Status: DC

## 2016-09-24 NOTE — Assessment & Plan Note (Signed)
  Metoprolol, Losartan, ASA, Simvastatin Labs

## 2016-09-24 NOTE — Progress Notes (Signed)
Subjective:  Patient ID: [name] [name], male    DOB: 29-Jul-1940  Age: 76 y.o. MRN: [id number]  CC: Back Pain   HPI [name] [name] presents for  Severe LBP R>L started after he sneezed 3 times last night   Outpatient Medications Prior to Visit  Medication Sig Dispense Refill  . aspirin 81 MG tablet Take 81 mg by mouth daily.      . cholecalciferol (VITAMIN D) 1000 UNITS tablet Take 1,000 Units by mouth daily.    . clopidogrel (PLAVIX) 75 MG tablet Take 75 mg by mouth daily.    [name] levothyroxine (SYNTHROID) 100 MCG tablet Take 1 tablet (100 mcg total) by mouth daily. Yearly physical due in June must see MD for refills 90 tablet 1  . losartan (COZAAR) 100 MG tablet Take 1 tablet (100 mg total) by mouth daily. 90 tablet 3  . metoprolol succinate (TOPROL-XL) 25 MG 24 hr tablet Take 1 tablet (25 mg total) by mouth daily. Yearly physical due in [place] must see MD for refills 90 tablet 1  . Multiple Vitamins-Minerals (CENTRUM SILVER PO) Take 1 tablet by mouth daily.      . niacin (NIASPAN) 1000 MG CR tablet Take 2,000 mg by mouth at bedtime.    . nitroGLYCERIN (NITROSTAT) 0.4 MG SL tablet Place 1 tablet (0.4 mg total) under the tongue every 5 (five) minutes as needed for chest pain (3 doses MAX). 25 tablet 4  . Omega-3 Fatty Acids (FISH OIL) 1200 MG CAPS Take 1 capsule by mouth daily.     . simvastatin (ZOCOR) 40 MG tablet Take 1 tablet (40 mg total) by mouth daily. 90 tablet 3   Facility-Administered Medications Prior to Visit  Medication Dose Route Frequency Provider Last Rate Last Dose  . 0.9 %  sodium chloride infusion  500 mL Intravenous Continuous Pyrtle, Lajuan Lines, MD        ROS Review of Systems  Constitutional: Negative for appetite change, fatigue and unexpected weight change.  HENT: Negative for congestion, nosebleeds, sneezing, sore throat and trouble swallowing.   Eyes: Negative for itching and visual disturbance.  Respiratory: Negative for cough.   Cardiovascular: Negative for  chest pain, palpitations and leg swelling.  Gastrointestinal: Negative for abdominal distention, blood in stool, diarrhea and nausea.  Genitourinary: Negative for frequency and hematuria.  Musculoskeletal: Positive for back pain and gait problem. Negative for joint swelling and neck pain.  Skin: Negative for rash.  Neurological: Negative for dizziness, tremors, speech difficulty and weakness.  Psychiatric/Behavioral: Negative for agitation, dysphoric mood and sleep disturbance. The patient is not nervous/anxious.     Objective:  BP (!) 144/72 (BP Location: Left Arm, Patient Position: Sitting, Cuff Size: Normal)   Pulse (!) 57   Temp 97.7 F (36.5 C) (Oral)   Ht 6\' 1"  (1.854 m)   Wt 202 lb 0.6 oz (91.6 kg)   SpO2 99%   BMI 26.66 kg/m   BP Readings from Last 3 Encounters:  09/24/16 (!) 144/72  08/16/16 (!) 128/50  05/21/16 (!) 150/72    Wt Readings from Last 3 Encounters:  09/24/16 202 lb 0.6 oz (91.6 kg)  08/16/16 201 lb 12.8 oz (91.5 kg)  05/21/16 213 lb (96.6 kg)    Physical Exam  Constitutional: He is oriented to person, place, and time. He appears well-developed. No distress.  NAD  HENT:  Mouth/Throat: Oropharynx is clear and moist.  Eyes: Conjunctivae are normal. Pupils are equal, round, and reactive to light.  Neck:  Normal range of motion. No JVD present. No thyromegaly present.  Cardiovascular: Normal rate, regular rhythm, normal heart sounds and intact distal pulses.  Exam reveals no gallop and no friction rub.   No murmur heard. Pulmonary/Chest: Effort normal and breath sounds normal. No respiratory distress. He has no wheezes. He has no rales. He exhibits no tenderness.  Abdominal: Soft. Bowel sounds are normal. He exhibits no distension and no mass. There is no tenderness. There is no rebound and no guarding.  Musculoskeletal: Normal range of motion. He exhibits tenderness. He exhibits no edema.  Lymphadenopathy:    He has no cervical adenopathy.  Neurological:  He is alert and oriented to person, place, and time. He has normal reflexes. No cranial nerve deficit. He exhibits normal muscle tone. He displays a negative Romberg sign. Coordination and gait normal.  Skin: Skin is warm and dry. No rash noted.  Psychiatric: He has a normal mood and affect. His behavior is normal. Judgment and thought content normal.  LS tender Decr ROM, stiff posterior legs Leg elevation (+/-) BMS OK B  Lab Results  Component Value Date   WBC 8.3 10/15/2015   HGB 13.8 10/15/2015   HCT 41.3 10/15/2015   PLT 198.0 10/15/2015   GLUCOSE 98 10/15/2015   CHOL 139 10/15/2015   TRIG 118.0 10/15/2015   HDL 45.40 10/15/2015   LDLCALC 70 10/15/2015   ALT 21 10/15/2015   AST 22 10/15/2015   NA 140 10/15/2015   K 4.0 10/15/2015   CL 106 10/15/2015   CREATININE 0.91 10/15/2015   BUN 10 10/15/2015   CO2 26 10/15/2015   TSH 4.29 10/15/2015   PSA 0.48 10/15/2015   INR 1.0 ratio 03/06/2010   HGBA1C 5.8 08/28/2013    Dg Cervical Spine Complete  Result Date: 05/29/2014 CLINICAL DATA:  Right-sided neck pain and stiffness. EXAM: CERVICAL SPINE  4+ VIEWS COMPARISON:  None. FINDINGS: There is slight foraminal narrowing at C3-4 and C4-5 on the right. There is small anterior osteophytes at several levels in the cervical spine with slight narrowing of the C6-7 disc space. Prevertebral soft tissues are normal. No fracture or subluxation or bone destruction. 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 Electronically Signed   By: Rozetta Nunnery M.D.   On: 05/29/2014 13:51    Assessment & Plan:   There are no diagnoses linked to this encounter. I am having Mr. Gullikson maintain his aspirin, Multiple Vitamins-Minerals (CENTRUM SILVER PO), Fish Oil, cholecalciferol, nitroGLYCERIN, losartan, simvastatin, niacin, metoprolol succinate, levothyroxine, and clopidogrel. We will continue to administer sodium chloride.  No orders of the defined  types were placed in this encounter.    Follow-up: No Follow-up on file.  Walker Kehr, MD

## 2016-09-24 NOTE — Assessment & Plan Note (Signed)
Labs

## 2016-09-24 NOTE — Assessment & Plan Note (Addendum)
MSK X ray Prednisone - see Rx Tramadol prn Heat MRI if worse

## 2016-09-27 ENCOUNTER — Encounter: Payer: Self-pay | Admitting: Internal Medicine

## 2016-10-21 ENCOUNTER — Ambulatory Visit (INDEPENDENT_AMBULATORY_CARE_PROVIDER_SITE_OTHER): Payer: Medicare Other | Admitting: Internal Medicine

## 2016-10-21 ENCOUNTER — Encounter: Payer: Self-pay | Admitting: Internal Medicine

## 2016-10-21 VITALS — BP 128/72 | HR 58 | Temp 98.4°F | Ht 73.0 in | Wt 206.0 lb

## 2016-10-21 DIAGNOSIS — I2510 Atherosclerotic heart disease of native coronary artery without angina pectoris: Secondary | ICD-10-CM

## 2016-10-21 DIAGNOSIS — I700 Atherosclerosis of aorta: Secondary | ICD-10-CM | POA: Insufficient documentation

## 2016-10-21 DIAGNOSIS — M5440 Lumbago with sciatica, unspecified side: Secondary | ICD-10-CM | POA: Diagnosis not present

## 2016-10-21 DIAGNOSIS — Z23 Encounter for immunization: Secondary | ICD-10-CM

## 2016-10-21 DIAGNOSIS — E034 Atrophy of thyroid (acquired): Secondary | ICD-10-CM | POA: Diagnosis not present

## 2016-10-21 DIAGNOSIS — I10 Essential (primary) hypertension: Secondary | ICD-10-CM | POA: Diagnosis not present

## 2016-10-21 DIAGNOSIS — Z136 Encounter for screening for cardiovascular disorders: Secondary | ICD-10-CM

## 2016-10-21 DIAGNOSIS — E785 Hyperlipidemia, unspecified: Secondary | ICD-10-CM | POA: Diagnosis not present

## 2016-10-21 MED ORDER — ZOSTER VAC RECOMB ADJUVANTED 50 MCG/0.5ML IM SUSR: 0.5000 mL | Freq: Once | INTRAMUSCULAR | 1 refills | Status: AC

## 2016-10-21 NOTE — Assessment & Plan Note (Signed)
Metoprolol, Losartan 

## 2016-10-21 NOTE — Assessment & Plan Note (Signed)
Resolved

## 2016-10-21 NOTE — Progress Notes (Signed)
Subjective:  Patient ID: [name] [name], male    DOB: 1940-08-25  Age: 76 y.o. MRN: [id number]  CC: No chief complaint on file.   HPI [name] [name] presents for LBP, hypothyroidism, dyslipidemia - atherosclerosis of aorta f/u  Outpatient Medications Prior to Visit  Medication Sig Dispense Refill  . aspirin 81 MG tablet Take 81 mg by mouth daily.      . cholecalciferol (VITAMIN D) 1000 UNITS tablet Take 1,000 Units by mouth daily.    . clopidogrel (PLAVIX) 75 MG tablet Take 75 mg by mouth daily.    [name] levothyroxine (SYNTHROID) 100 MCG tablet Take 1 tablet (100 mcg total) by mouth daily. Yearly physical due in June must see MD for refills 90 tablet 1  . losartan (COZAAR) 100 MG tablet Take 1 tablet (100 mg total) by mouth daily. 90 tablet 3  . metoprolol succinate (TOPROL-XL) 25 MG 24 hr tablet Take 1 tablet (25 mg total) by mouth daily. Yearly physical due in [place] must see MD for refills 90 tablet 1  . Multiple Vitamins-Minerals (CENTRUM SILVER PO) Take 1 tablet by mouth daily.      . niacin (NIASPAN) 1000 MG CR tablet Take 2,000 mg by mouth at bedtime.    . nitroGLYCERIN (NITROSTAT) 0.4 MG SL tablet Place 1 tablet (0.4 mg total) under the tongue every 5 (five) minutes as needed for chest pain (3 doses MAX). 25 tablet 4  . Omega-3 Fatty Acids (FISH OIL) 1200 MG CAPS Take 1 capsule by mouth daily.     . simvastatin (ZOCOR) 40 MG tablet Take 1 tablet (40 mg total) by mouth daily. 90 tablet 3  . predniSONE (DELTASONE) 10 MG tablet Prednisone 10 mg: take 4 tabs a day x 3 days; then 3 tabs a day x 4 days; then 2 tabs a day x 4 days, then 1 tab a day x 6 days, then stop. Take pc. 38 tablet 1   Facility-Administered Medications Prior to Visit  Medication Dose Route Frequency Provider Last Rate Last Dose  . 0.9 %  sodium chloride infusion  500 mL Intravenous Continuous Pyrtle, Lajuan Lines, MD        ROS Review of Systems  Constitutional: Negative for appetite change, fatigue and unexpected  weight change.  HENT: Negative for congestion, nosebleeds, sneezing, sore throat and trouble swallowing.   Eyes: Negative for itching and visual disturbance.  Respiratory: Negative for cough.   Cardiovascular: Negative for chest pain, palpitations and leg swelling.  Gastrointestinal: Negative for abdominal distention, blood in stool, diarrhea and nausea.  Genitourinary: Negative for frequency and hematuria.  Musculoskeletal: Negative for back pain, gait problem, joint swelling and neck pain.  Skin: Negative for rash.  Neurological: Negative for dizziness, tremors, speech difficulty and weakness.  Psychiatric/Behavioral: Negative for agitation, dysphoric mood and sleep disturbance. The patient is not nervous/anxious.     Objective:  BP 128/72 (BP Location: Left Arm, Patient Position: Sitting, Cuff Size: Normal)   Pulse (!) 58   Temp 98.4 F (36.9 C) (Oral)   Ht 6\' 1"  (1.854 m)   Wt 206 lb (93.4 kg)   SpO2 99%   BMI 27.18 kg/m   BP Readings from Last 3 Encounters:  10/21/16 128/72  09/24/16 (!) 144/72  08/16/16 (!) 128/50    Wt Readings from Last 3 Encounters:  10/21/16 206 lb (93.4 kg)  09/24/16 202 lb 0.6 oz (91.6 kg)  08/16/16 201 lb 12.8 oz (91.5 kg)    Physical Exam  Constitutional: He is oriented to person, place, and time. He appears well-developed. No distress.  NAD  HENT:  Mouth/Throat: Oropharynx is clear and moist.  Eyes: Conjunctivae are normal. Pupils are equal, round, and reactive to light.  Neck: Normal range of motion. No JVD present. No thyromegaly present.  Cardiovascular: Normal rate, regular rhythm, normal heart sounds and intact distal pulses.  Exam reveals no gallop and no friction rub.   No murmur heard. Pulmonary/Chest: Effort normal and breath sounds normal. No respiratory distress. He has no wheezes. He has no rales. He exhibits no tenderness.  Abdominal: Soft. Bowel sounds are normal. He exhibits no distension and no mass. There is no tenderness.  There is no rebound and no guarding.  Genitourinary: Prostate normal. Rectal exam shows guaiac negative stool.  Musculoskeletal: Normal range of motion. He exhibits no edema or tenderness.  Lymphadenopathy:    He has no cervical adenopathy.  Neurological: He is alert and oriented to person, place, and time. He has normal reflexes. No cranial nerve deficit. He exhibits normal muscle tone. He displays a negative Romberg sign. Coordination and gait normal.  Skin: Skin is warm and dry. No rash noted.  Psychiatric: He has a normal mood and affect. His behavior is normal. Judgment and thought content normal.    Lab Results  Component Value Date   WBC 8.3 10/15/2015   HGB 13.8 10/15/2015   HCT 41.3 10/15/2015   PLT 198.0 10/15/2015   GLUCOSE 98 10/15/2015   CHOL 139 10/15/2015   TRIG 118.0 10/15/2015   HDL 45.40 10/15/2015   LDLCALC 70 10/15/2015   ALT 21 10/15/2015   AST 22 10/15/2015   NA 140 10/15/2015   K 4.0 10/15/2015   CL 106 10/15/2015   CREATININE 0.91 10/15/2015   BUN 10 10/15/2015   CO2 26 10/15/2015   TSH 4.29 10/15/2015   PSA 0.48 10/15/2015   INR 1.0 ratio 03/06/2010   HGBA1C 5.8 08/28/2013    Dg Lumbar Spine 2-3 Views  Result Date: 09/24/2016 CLINICAL DATA:  Low back pain. EXAM: LUMBAR SPINE - 2-3 VIEW COMPARISON:  No recent prior. FINDINGS: No acute bony abnormality identified. Normal alignment. Normal mineralization. Aortoiliac atherosclerotic vascular calcification. IMPRESSION: 1. No acute bony abnormality. 2. Aortoiliac atherosclerotic vascular disease. Electronically Signed   By: Marcello Moores  Register   On: 09/24/2016 15:05    Assessment & Plan:   There are no diagnoses linked to this encounter. I have discontinued Mr. Goracke predniSONE. I am also having him maintain his aspirin, Multiple Vitamins-Minerals (CENTRUM SILVER PO), Fish Oil, cholecalciferol, nitroGLYCERIN, losartan, simvastatin, niacin, metoprolol succinate, levothyroxine, and clopidogrel. We will  continue to administer sodium chloride.  No orders of the defined types were placed in this encounter.    Follow-up: No Follow-up on file.  Walker Kehr, MD

## 2016-10-21 NOTE — Assessment & Plan Note (Signed)
abd US

## 2016-10-21 NOTE — Assessment & Plan Note (Signed)
Metoprolol, Losartan, ASA, Simvastatin 

## 2016-10-21 NOTE — Assessment & Plan Note (Signed)
On Simvastatin 

## 2016-10-21 NOTE — Addendum Note (Signed)
Addended by: Karren Cobble on: 10/21/2016 09:28 AM   Modules accepted: Orders

## 2016-10-21 NOTE — Assessment & Plan Note (Signed)
labs

## 2016-10-22 ENCOUNTER — Other Ambulatory Visit (INDEPENDENT_AMBULATORY_CARE_PROVIDER_SITE_OTHER): Payer: Medicare Other

## 2016-10-22 DIAGNOSIS — I2510 Atherosclerotic heart disease of native coronary artery without angina pectoris: Secondary | ICD-10-CM | POA: Diagnosis not present

## 2016-10-22 DIAGNOSIS — E785 Hyperlipidemia, unspecified: Secondary | ICD-10-CM | POA: Diagnosis not present

## 2016-10-22 DIAGNOSIS — I10 Essential (primary) hypertension: Secondary | ICD-10-CM | POA: Diagnosis not present

## 2016-10-22 DIAGNOSIS — M5440 Lumbago with sciatica, unspecified side: Secondary | ICD-10-CM

## 2016-10-22 DIAGNOSIS — N320 Bladder-neck obstruction: Secondary | ICD-10-CM

## 2016-10-22 LAB — CBC WITH DIFFERENTIAL/PLATELET
Basophils Absolute: 0 10*3/uL (ref 0.0–0.1)
Basophils Relative: 0.5 % (ref 0.0–3.0)
EOS ABS: 0.4 10*3/uL (ref 0.0–0.7)
EOS PCT: 5.2 % — AB (ref 0.0–5.0)
HCT: 41 % (ref 39.0–52.0)
HEMOGLOBIN: 13.8 g/dL (ref 13.0–17.0)
Lymphocytes Relative: 27.4 % (ref 12.0–46.0)
Lymphs Abs: 1.9 10*3/uL (ref 0.7–4.0)
MCHC: 33.5 g/dL (ref 30.0–36.0)
MCV: 89 fl (ref 78.0–100.0)
MONO ABS: 0.6 10*3/uL (ref 0.1–1.0)
Monocytes Relative: 9 % (ref 3.0–12.0)
Neutro Abs: 3.9 10*3/uL (ref 1.4–7.7)
Neutrophils Relative %: 57.9 % (ref 43.0–77.0)
Platelets: 195 10*3/uL (ref 150.0–400.0)
RBC: 4.61 Mil/uL (ref 4.22–5.81)
RDW: 14.4 % (ref 11.5–15.5)
WBC: 6.8 10*3/uL (ref 4.0–10.5)

## 2016-10-22 LAB — BASIC METABOLIC PANEL
BUN: 14 mg/dL (ref 6–23)
CALCIUM: 9.8 mg/dL (ref 8.4–10.5)
CO2: 27 mEq/L (ref 19–32)
Chloride: 105 mEq/L (ref 96–112)
Creatinine, Ser: 1.04 mg/dL (ref 0.40–1.50)
GFR: 73.89 mL/min (ref >60.00)
Glucose, Bld: 89 mg/dL (ref 70–99)
POTASSIUM: 4.3 meq/L (ref 3.5–5.1)
SODIUM: 143 meq/L (ref 135–145)

## 2016-10-22 LAB — URINALYSIS
Bilirubin Urine: NEGATIVE
Hgb urine dipstick: NEGATIVE
KETONES UR: NEGATIVE
LEUKOCYTES UA: NEGATIVE
Nitrite: NEGATIVE
PH: 7.5 (ref 5.0–8.0)
SPECIFIC GRAVITY, URINE: 1.01 (ref 1.000–1.030)
Total Protein, Urine: NEGATIVE
UROBILINOGEN UA: 0.2 (ref 0.0–1.0)
Urine Glucose: NEGATIVE

## 2016-10-22 LAB — HEPATIC FUNCTION PANEL
ALT: 24 U/L (ref 0–53)
AST: 20 U/L (ref 0–37)
Albumin: 4.3 g/dL (ref 3.5–5.2)
Alkaline Phosphatase: 36 U/L — ABNORMAL LOW (ref 39–117)
BILIRUBIN TOTAL: 1 mg/dL (ref 0.2–1.2)
Bilirubin, Direct: 0.2 mg/dL (ref 0.0–0.3)
Total Protein: 6.7 g/dL (ref 6.0–8.3)

## 2016-10-22 LAB — LIPID PANEL
CHOLESTEROL: 136 mg/dL (ref 0–200)
HDL: 55.2 mg/dL (ref >39.00)
LDL CALC: 61 mg/dL (ref 0–99)
NonHDL: 80.83
Total CHOL/HDL Ratio: 2
Triglycerides: 98 mg/dL (ref 0.0–149.0)
VLDL: 19.6 mg/dL (ref 0.0–40.0)

## 2016-10-22 LAB — PSA: PSA: 0.68 ng/mL (ref 0.10–4.00)

## 2016-10-22 LAB — TSH: TSH: 3.45 u[IU]/mL (ref 0.35–4.50)

## 2016-11-02 ENCOUNTER — Ambulatory Visit (HOSPITAL_COMMUNITY)
Admission: RE | Admit: 2016-11-02 | Discharge: 2016-11-02 | Disposition: A | Discharge status: Home / Self Care | Payer: Medicare Other | Source: Ambulatory Visit | Attending: Vascular Surgery | Admitting: Vascular Surgery

## 2016-11-02 DIAGNOSIS — I7090 Unspecified atherosclerosis: Secondary | ICD-10-CM

## 2016-11-02 DIAGNOSIS — I10 Essential (primary) hypertension: Secondary | ICD-10-CM

## 2016-11-02 DIAGNOSIS — I700 Atherosclerosis of aorta: Secondary | ICD-10-CM | POA: Diagnosis not present

## 2016-11-02 DIAGNOSIS — I2510 Atherosclerotic heart disease of native coronary artery without angina pectoris: Secondary | ICD-10-CM

## 2016-11-02 DIAGNOSIS — Z136 Encounter for screening for cardiovascular disorders: Secondary | ICD-10-CM | POA: Diagnosis not present

## 2016-11-10 NOTE — Progress Notes (Signed)
Pre visit review using our clinic review tool, if applicable. No additional management support is needed unless otherwise documented below in the visit note. 

## 2016-11-10 NOTE — Progress Notes (Addendum)
Subjective:   [name] [name] is a 76 y.o. male who presents for Medicare Annual/Subsequent preventive examination.  Review of Systems:  No ROS.  Medicare Wellness Visit. Additional risk factors are reflected in the social history.    Sleep patterns: feels rested on waking, gets up 1-2 times nightly to void and sleeps 4-6 hours nightly.  Patient states this is his baseline for sleep, he feels rested and states he does not feel stressed.   Home Safety/Smoke Alarms: Feels safe in home. Smoke alarms in place.  Living environment; residence and Firearm Safety: 2-story house, no firearms. Lives with wife, no needs for DME, good support system  Seat Belt Safety/Bike Helmet: Wears seat belt.   Counseling:   Eye Exam- appointment yearly, Dr.Grout [facility]- appointment every 6 months,  Dr Ladona Horns  Male:   CCS- Last 02/19/16, polyps, recall 3 years    PSA-  Lab Results  Component Value Date   PSA 0.68 10/22/2016   PSA 0.48 10/15/2015   PSA 0.49 10/08/2014       Objective:    Vitals: There were no vitals taken for this visit.  There is no height or weight on file to calculate BMI.  Tobacco History  Smoking Status  . Former Smoker  . Quit date: 04/1978  Smokeless Tobacco  . Never Used     Counseling given: Not Answered   Past Medical History:  Diagnosis Date  . Allergic rhinitis   . Allergy   . CAD (coronary artery disease)   . Cataract   . CERUMEN IMPACTION 06/04/2009   5/15    . Colon polyp   . Diverticulosis   . Family hx of colon cancer    brother  . Hyperlipemia   . Hypertension   . Hypothyroidism   . MI (myocardial infarction) ([place]) 1993  . OSA (obstructive sleep apnea)   . Psoriasis   . Sleep apnea    no cpap- had sleep study per pt no OSA   Past Surgical History:  Procedure Laterality Date  . CARDIAC CATHETERIZATION    . COLONOSCOPY    . POLYPECTOMY    . PTCA    . TONSILLECTOMY     Family History  Problem Relation Age of Onset  . Colon cancer  Brother        died 52  . Cancer Brother        bone cancer  . Coronary artery disease Mother   . Heart disease Mother   . Colon polyps Sister        x2  . COPD Father   . Colon cancer Paternal Grandfather   . Hypertension Unknown   . Colon cancer Unknown   . Colon cancer Maternal Aunt   . Colon cancer Maternal Uncle   . Colon cancer Maternal Aunt   . Colon cancer Paternal Aunt   . Esophageal cancer Neg Hx   . Rectal cancer Neg Hx   . Stomach cancer Neg Hx    History  Sexual Activity  . Sexual activity: Not Currently    Outpatient Encounter Prescriptions as of 11/11/2016  Medication Sig  . aspirin 81 MG tablet Take 81 mg by mouth daily.    . cholecalciferol (VITAMIN D) 1000 UNITS tablet Take 1,000 Units by mouth daily.  . clopidogrel (PLAVIX) 75 MG tablet Take 75 mg by mouth daily.  [name] levothyroxine (SYNTHROID) 100 MCG tablet Take 1 tablet (100 mcg total) by mouth daily. Yearly physical due in June must see  MD for refills  . losartan (COZAAR) 100 MG tablet Take 1 tablet (100 mg total) by mouth daily.  . metoprolol succinate (TOPROL-XL) 25 MG 24 hr tablet Take 1 tablet (25 mg total) by mouth daily. Yearly physical due in [place] must see MD for refills  . Multiple Vitamins-Minerals (CENTRUM SILVER PO) Take 1 tablet by mouth daily.    . niacin (NIASPAN) 1000 MG CR tablet Take 2,000 mg by mouth at bedtime.  . nitroGLYCERIN (NITROSTAT) 0.4 MG SL tablet Place 1 tablet (0.4 mg total) under the tongue every 5 (five) minutes as needed for chest pain (3 doses MAX).  . Omega-3 Fatty Acids (FISH OIL) 1200 MG CAPS Take 1 capsule by mouth daily.   . simvastatin (ZOCOR) 40 MG tablet Take 1 tablet (40 mg total) by mouth daily.   Facility-Administered Encounter Medications as of 11/11/2016  Medication  . 0.9 %  sodium chloride infusion    Activities of Daily Living No flowsheet data found.  Patient Care Team: Plotnikov, Evie Lacks, MD as PCP - General Josue Hector, MD  (Cardiology) Sable Feil, MD as Attending Physician (Gastroenterology) Clent Jacks, MD (Ophthalmology)   Assessment:    Physical assessment deferred to PCP.  Exercise Activities and Dietary recommendations   Diet (meal preparation, eat out, water intake, caffeinated beverages, dairy products, fruits and vegetables): in general, a "healthy" diet  , well balanced, low fat/ cholesterol, low salt, eats a variety of fruits and vegetables daily, limits salt, fat/cholesterol, sugar, caffeine, drinks 6-8 glasses of water daily.   Goals    . Exercise 3x per week (30 min per time)          Agrees to increase exercise; back to walking and jogging; 5 days a week build up to 5 miles per hour.       Fall Risk Fall Risk  10/21/2016 10/15/2015 10/08/2014  Falls in the past year? No No No   Depression Screen PHQ 2/9 Scores 10/21/2016 10/15/2015 10/08/2014  PHQ - 2 Score 0 0 0    Cognitive Function       Ad8 score reviewed for issues:  Issues making decisions: no  Less interest in hobbies / activities: no  Repeats questions, stories (family complaining): no  Trouble using ordinary gadgets (microwave, computer, phone):no  Forgets the month or year: no  Mismanaging finances: no  Remembering appts: no  Daily problems with thinking and/or memory: no Ad8 score is= 0  Immunization History  Administered Date(s) Administered  . Influenza Whole 04/14/2007, 06/05/2010  . Influenza, High Dose Seasonal PF 05/14/2016  . Influenza,inj,Quad PF,36+ Mos 05/16/2014  . Pneumococcal Conjugate-13 08/28/2013  . Pneumococcal Polysaccharide-23 06/04/2009  . Td 08/16/2011  . Tdap 10/21/2016  . Zoster 06/05/2010   Screening Tests Health Maintenance  Topic Date Due  . INFLUENZA VACCINE  11/24/2016  . COLONOSCOPY  02/19/2019  . TETANUS/TDAP  10/22/2026  . PNA vac Low Risk Adult  Completed      Plan:     Continue doing brain stimulating activities (puzzles, reading, adult coloring  books, staying active) to keep memory sharp.   Continue to eat heart healthy diet (full of fruits, vegetables, whole grains, lean protein, water--limit salt, fat, and sugar intake) and increase physical activity as tolerated.  I have personally reviewed and noted the following in the patient's chart:   . Medical and social history . Use of alcohol, tobacco or illicit drugs  . Current medications and supplements . Functional ability and status .  Nutritional status . Physical activity . Advanced directives . List of other physicians . Vitals . Screenings to include cognitive, depression, and falls . Referrals and appointments  In addition, I have reviewed and discussed with patient certain preventive protocols, quality metrics, and best practice recommendations. A written personalized care plan for preventive services as well as general preventive health recommendations were provided to patient.     Michiel Cowboy, RN  11/10/2016  Medical screening examination/treatment/procedure(s) were performed by non-physician practitioner and as supervising physician I was immediately available for consultation/collaboration. I agree with above. Walker Kehr, MD

## 2016-11-11 ENCOUNTER — Ambulatory Visit (INDEPENDENT_AMBULATORY_CARE_PROVIDER_SITE_OTHER): Payer: Medicare Other | Admitting: *Deleted

## 2016-11-11 VITALS — BP 132/78 | HR 62 | Resp 20 | Ht 73.0 in | Wt 206.0 lb

## 2016-11-11 DIAGNOSIS — Z0000 Encounter for general adult medical examination without abnormal findings: Secondary | ICD-10-CM

## 2016-11-11 NOTE — Patient Instructions (Signed)
Continue doing brain stimulating activities (puzzles, reading, adult coloring books, staying active) to keep memory sharp.   Continue to eat heart healthy diet (full of fruits, vegetables, whole grains, lean protein, water--limit salt, fat, and sugar intake) and increase physical activity as tolerated.   [name] [name] , Thank you for taking time to come for your Medicare Wellness Visit. I appreciate your ongoing commitment to your health goals. Please review the following plan we discussed and let me know if I can assist you in the future.   These are the goals we discussed: Goals    . Exercise 3x per week (30 min per time)          Agrees to increase exercise; back to walking and jogging; 5 days a week build up to 5 miles per hour.     . Maintain current health status           Continue to exercise, eat healthy, enjoy life and family       This is a list of the screening recommended for you and due dates:  Health Maintenance  Topic Date Due  . Flu Shot  11/24/2016  . Colon Cancer Screening  02/19/2019  . Tetanus Vaccine  10/22/2026  . Pneumonia vaccines  Completed

## 2016-11-26 ENCOUNTER — Other Ambulatory Visit: Payer: Self-pay | Admitting: Internal Medicine

## 2016-12-17 ENCOUNTER — Other Ambulatory Visit: Payer: Self-pay | Admitting: Cardiovascular Disease

## 2016-12-17 MED ORDER — NIACIN ER (ANTIHYPERLIPIDEMIC) 1000 MG PO TBCR: 2000.0000 mg | EXTENDED_RELEASE_TABLET | Freq: Every day | ORAL | 2 refills | Status: DC

## 2016-12-17 NOTE — Telephone Encounter (Signed)
Pt's medication was sent to pt's pharmacy as requested. Confirmation received.  °

## 2016-12-18 ENCOUNTER — Other Ambulatory Visit: Payer: Self-pay | Admitting: Internal Medicine

## 2016-12-20 ENCOUNTER — Other Ambulatory Visit: Payer: Self-pay | Admitting: Internal Medicine

## 2017-02-11 ENCOUNTER — Encounter: Payer: Self-pay | Admitting: Cardiovascular Disease

## 2017-02-23 NOTE — Progress Notes (Signed)
Patient ID: [name] [name], male   DOB: 04-17-1941, 76 y.o.   MRN: [id number] [name] [name] is  76 y.o. previously seen by Dr Olevia Perches. [name] He has documented coronary disease and has chronic occlusion of the right coronary had cutting balloon angioplasty to the diagonal branch of the LAD. This occluded in 2008. F/U cath for abnormal ETT 11/11 showed occluded RCA and D1 with 70% mid LAD that Dr Olevia Perches decided to manage medically.  He has been doing fairly well   Myovue 05/03/16 low risk  MPI images personally reviewed    Nuclear stress EF: 64%.  Blood pressure demonstrated a hypertensive response to exercise.  Upsloping ST segment depression ST segment depression of 2 mm was noted during stress in the V4 and V5 leads, beginning at 9 minutes of stress, and returning to baseline after less than 1 minute of recovery.  Defect 1: There is a small defect of moderate severity present in the basal inferior and mid inferior location consistent with probable soft tissue attenuation, cannot rule out subendicardial scar. No ischemia  This is a low risk study.  No chest pain Nitro  refilled   Tax season very stressful for him and he may retire  Son in [place] and is catholic priest  Other son is a Chief Executive Officer in [place]   ROS: Denies fever, malais, weight loss, blurry vision, decreased visual acuity, cough, sputum, SOB, hemoptysis, pleuritic pain, palpitaitons, heartburn, abdominal pain, melena, lower extremity edema, claudication, or rash.  All other systems reviewed and negative  General: There were no vitals taken for this visit. Affect appropriate Healthy:  appears stated age 13: normal Neck supple with no adenopathy JVP normal no bruits no thyromegaly Lungs clear with no wheezing and good diaphragmatic motion Heart:  S1/S2 no murmur, no rub, gallop or click PMI normal Abdomen: benighn, BS positve, no tenderness, no AAA no bruit.  No HSM or HJR Distal pulses intact but left iliac bruit No  edema Neuro non-focal Skin warm and dry No muscular weakness      Allergies  Patient has no known allergies.  Electrocardiogram:  SR LAFB RBBB  10/09/13  01/23/15  NSR rate 60 LAD RBBB LVH no change  02/28/17 SR RBBB LAFB LVH   Assessment and Plan CAD: Occluded D1 and RCA with moderate LAD disease non ischemic myovue 05/03/16 Continue medical RX   HTN: Well controlled.  Continue current medications and low sodium Dash type diet.    Thyroid   Lab Results  Component Value Date   TSH 3.45 10/22/2016   Chol:  Lab Results  Component Value Date   [place] 61 10/22/2016   PVD:  Calcium seen on lumbar film f/u US with >50% left iliac disease no claudication will do ABI's and LE arterial duplex next year   FU with me in  6 months

## 2017-02-25 ENCOUNTER — Other Ambulatory Visit: Payer: Self-pay | Admitting: Cardiovascular Disease

## 2017-02-26 ENCOUNTER — Ambulatory Visit (INDEPENDENT_AMBULATORY_CARE_PROVIDER_SITE_OTHER): Payer: Medicare Other

## 2017-02-26 DIAGNOSIS — Z23 Encounter for immunization: Secondary | ICD-10-CM | POA: Diagnosis not present

## 2017-02-28 ENCOUNTER — Ambulatory Visit (INDEPENDENT_AMBULATORY_CARE_PROVIDER_SITE_OTHER): Payer: Medicare Other | Admitting: Cardiovascular Disease

## 2017-02-28 ENCOUNTER — Encounter: Payer: Self-pay | Admitting: Cardiovascular Disease

## 2017-02-28 DIAGNOSIS — I2510 Atherosclerotic heart disease of native coronary artery without angina pectoris: Secondary | ICD-10-CM | POA: Diagnosis not present

## 2017-02-28 DIAGNOSIS — I10 Essential (primary) hypertension: Secondary | ICD-10-CM | POA: Diagnosis not present

## 2017-02-28 DIAGNOSIS — I252 Old myocardial infarction: Secondary | ICD-10-CM | POA: Diagnosis not present

## 2017-02-28 NOTE — Patient Instructions (Signed)

## 2017-03-11 MED FILL — SHINGRIX 50 MCG SUS: 50 | 1 days supply | Qty: 1 | Fill #0

## 2017-03-29 ENCOUNTER — Encounter: Payer: Self-pay | Admitting: Family Medicine

## 2017-03-29 ENCOUNTER — Ambulatory Visit (INDEPENDENT_AMBULATORY_CARE_PROVIDER_SITE_OTHER): Payer: Medicare Other | Admitting: Family Medicine

## 2017-03-29 VITALS — BP 116/78 | HR 98 | Temp 98.2°F | Wt 210.8 lb

## 2017-03-29 DIAGNOSIS — J018 Other acute sinusitis: Secondary | ICD-10-CM

## 2017-03-29 DIAGNOSIS — I2510 Atherosclerotic heart disease of native coronary artery without angina pectoris: Secondary | ICD-10-CM | POA: Diagnosis not present

## 2017-03-29 MED ORDER — AZITHROMYCIN 250 MG PO TABS: ORAL_TABLET | ORAL | 0 refills | Status: DC

## 2017-03-29 NOTE — Progress Notes (Signed)
   Subjective:    Patient ID: [name] [name], male    DOB: 06/09/1940, 76 y.o.   MRN: [id number]  HPI Here for 10 days of stuffy head, PND, ST., and a dry cough. Now for 2 days he has been blowing green mucus from the nose. Using Tylenol.    Review of Systems  Constitutional: Negative.   HENT: Positive for congestion, postnasal drip, sinus pressure, sinus pain and sore throat. Negative for ear pain.   Respiratory: Positive for cough.        Objective:   Physical Exam  Constitutional: He appears well-developed and well-nourished.  HENT:  Right Ear: External ear normal.  Left Ear: External ear normal.  Nose: Nose normal.  Mouth/Throat: Oropharynx is clear and moist.  Eyes: Conjunctivae are normal.  Neck: Neck supple. No thyromegaly present.  Pulmonary/Chest: Effort normal and breath sounds normal. No respiratory distress. He has no wheezes. He has no rales.  Lymphadenopathy:    He has no cervical adenopathy.          Assessment & Plan:  Sinusitis, treat with a Zpack.  Alysia Penna, MD

## 2017-04-21 DIAGNOSIS — R1310 Dysphagia, unspecified: Secondary | ICD-10-CM | POA: Diagnosis not present

## 2017-05-05 ENCOUNTER — Encounter: Payer: Self-pay | Admitting: Internal Medicine

## 2017-05-05 ENCOUNTER — Ambulatory Visit (INDEPENDENT_AMBULATORY_CARE_PROVIDER_SITE_OTHER): Payer: Medicare Other | Admitting: Internal Medicine

## 2017-05-05 ENCOUNTER — Ambulatory Visit (INDEPENDENT_AMBULATORY_CARE_PROVIDER_SITE_OTHER)
Admission: RE | Admit: 2017-05-05 | Discharge: 2017-05-05 | Disposition: A | Discharge status: Home / Self Care | Payer: Medicare Other | Source: Ambulatory Visit | Attending: Internal Medicine | Admitting: Internal Medicine

## 2017-05-05 VITALS — BP 136/78 | HR 66 | Temp 97.9°F | Ht 73.0 in | Wt 213.0 lb

## 2017-05-05 DIAGNOSIS — R1310 Dysphagia, unspecified: Secondary | ICD-10-CM | POA: Insufficient documentation

## 2017-05-05 DIAGNOSIS — M542 Cervicalgia: Secondary | ICD-10-CM

## 2017-05-05 DIAGNOSIS — I10 Essential (primary) hypertension: Secondary | ICD-10-CM | POA: Diagnosis not present

## 2017-05-05 MED ORDER — TIZANIDINE HCL 4 MG PO TABS: 4.0000 mg | ORAL_TABLET | Freq: Four times a day (QID) | ORAL | 0 refills | Status: DC | PRN

## 2017-05-05 MED ORDER — PREDNISONE 10 MG PO TABS: ORAL_TABLET | ORAL | 0 refills | Status: DC

## 2017-05-05 MED ORDER — MELOXICAM 7.5 MG PO TABS: 7.5000 mg | ORAL_TABLET | Freq: Every day | ORAL | 1 refills | Status: DC | PRN

## 2017-05-05 NOTE — Progress Notes (Deleted)
Subjective:    Patient ID: [name] [name], male    DOB: 02-06-1941, 77 y.o.   MRN: [id number]  HPI  Here to f/u with acute visit with co > 1 wk right post lat neck pain, sharp and dull, worse to turn head to right but not to the left, hears a sort of grinding and popping, has some pain radiating to the right upper back but denies bowel or bladder change, fever, wt loss,  worsening UE and LE pain/numbness/weakness, gait change or falls.   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  Pt is asking for f/u films and tx. Also incidentally with several wks of upper chest dysphagia it seems, Denies worsening reflux, abd pain, n/v, bowel change or blood.  Appetite ok, no wt loss Pt denies chest pain, increased sob or doe, wheezing, orthopnea, PND, increased LE swelling, palpitations, dizziness or syncope.  Wt Readings from Last 3 Encounters:  05/05/17 213 lb (96.6 kg)  03/29/17 210 lb 12.8 oz (95.6 kg)  11/11/16 206 lb (93.4 kg)   Past Medical History:  Diagnosis Date  . Allergic rhinitis   . Allergy   . CAD (coronary artery disease)   . Cataract   . CERUMEN IMPACTION 06/04/2009   5/15    . Colon polyp   . Diverticulosis   . Family hx of colon cancer    brother  . Hyperlipemia   . Hypertension   . Hypothyroidism   . MI (myocardial infarction) ([place]) 1993  . OSA (obstructive sleep apnea)   . Psoriasis   . Sleep apnea    no cpap- had sleep study per pt no OSA   Past Surgical History:  Procedure Laterality Date  . CARDIAC CATHETERIZATION    . COLONOSCOPY    . POLYPECTOMY    . PTCA    . TONSILLECTOMY      reports that he quit smoking about 39 years ago. he has never used smokeless tobacco. He reports that he drinks about 3.6 oz of alcohol per week. He reports that he does not use drugs. family history includes COPD in his father; Cancer in his  brother; Colon cancer in his brother, maternal aunt, maternal aunt, maternal uncle, paternal aunt, paternal grandfather, and unknown relative; Colon polyps in his sister; Coronary artery disease in his mother; Heart disease in his mother; Hypertension in his unknown relative. No Known Allergies Current Outpatient Medications on File Prior to Visit  Medication Sig Dispense Refill  . acetaminophen (TYLENOL) 500 MG tablet Take 500 mg by mouth every 6 (six) hours as needed.    [name] aspirin 81 MG tablet Take 81 mg by mouth daily.      . cholecalciferol (VITAMIN D) 1000 UNITS tablet Take 1,000 Units by mouth daily.    . clopidogrel (PLAVIX) 75 MG tablet TAKE 1 TABLET DAILY 90 tablet 1  . levothyroxine (SYNTHROID, LEVOTHROID) 100 MCG tablet TAKE 1 TABLET BY MOUTH EVERY DAY (***PT WANTS GENERIC***) 90 tablet 3  . losartan (COZAAR) 100 MG tablet Take 1 tablet (100 mg total) by mouth daily. 90 tablet 3  . metoprolol succinate (TOPROL-XL) 25 MG 24 hr tablet TAKE 1 TABLET BY MOUTH EVERY DAY 90 tablet 3  . Multiple Vitamins-Minerals (CENTRUM SILVER PO) Take 1 tablet by mouth daily.      . niacin (NIASPAN) 1000 MG CR  tablet Take 2 tablets (2,000 mg total) by mouth at bedtime. 180 tablet 2  . nitroGLYCERIN (NITROSTAT) 0.4 MG SL tablet Place 1 tablet (0.4 mg total) under the tongue every 5 (five) minutes as needed for chest pain (3 doses MAX). 25 tablet 4  . Omega-3 Fatty Acids (FISH OIL) 1200 MG CAPS Take 1 capsule by mouth daily.     . simvastatin (ZOCOR) 40 MG tablet TAKE 1 TABLET BY MOUTH EVERY DAY 90 tablet 3   Current Facility-Administered Medications on File Prior to Visit  Medication Dose Route Frequency Provider Last Rate Last Dose  . 0.9 %  sodium chloride infusion  500 mL Intravenous Continuous Pyrtle, Lajuan Lines, MD       Review of Systems  Constitutional: Negative for other unusual diaphoresis or sweats HENT: Negative for ear discharge or swelling Eyes: Negative for other worsening visual  disturbances Respiratory: Negative for stridor or other swelling  Gastrointestinal: Negative for worsening distension or other blood Genitourinary: Negative for retention or other urinary change Musculoskeletal: Negative for other MSK pain or swelling Skin: Negative for color change or other new lesions Neurological: Negative for worsening tremors and other numbness  Psychiatric/Behavioral: Negative for worsening agitation or other fatigue All other system neg per pt    Objective:   Physical Exam BP 136/78   Pulse 66   Temp 97.9 F (36.6 C) (Oral)   Ht 6\' 1"  (1.854 m)   Wt 213 lb (96.6 kg)   SpO2 100%   BMI 28.10 kg/m  VS noted,  Constitutional: Pt appears in NAD HENT: Head: NCAT.  Right Ear: External ear normal.  Left Ear: External ear normal.  Eyes: . Pupils are equal, round, and reactive to light. Conjunctivae and EOM are normal Nose: without d/c or deformity Neck: Neck supple. Gross normal ROM but mild tender right post lateral without swelling or rash Cardiovascular: Normal rate and regular rhythm.   Pulmonary/Chest: Effort normal and breath sounds without rales or wheezing.  Abd:  Soft, NT, ND, + BS, no organomegaly Neurological: Pt is alert. At baseline orientation, motor 5/5 intact Skin: Skin is warm. No rashes, other new lesions, no LE edema Psychiatric: Pt behavior is normal without agitation  No other exam findings    Assessment & Plan:

## 2017-05-05 NOTE — Patient Instructions (Signed)
Please take all new medication as prescribed - the mobic for pain, muscle relaxer, and prednisone  You will be contacted regarding the referral for: Gastroenterology  Please continue all other medications as before, and refills have been done if requested.  Please have the pharmacy call with any other refills you may need.  Please keep your appointments with your specialists as you may have planned  Please go to the XRAY Department in the Basement (go straight as you get off the elevator) for the x-ray testing  You will be contacted by phone if any changes need to be made immediately.  Otherwise, you will receive a letter about your results with an explanation, but please check with MyChart first.  Please remember to sign up for MyChart if you have not done so, as this will be important to you in the future with finding out test results, communicating by private email, and scheduling acute appointments online when needed.

## 2017-05-07 ENCOUNTER — Encounter: Payer: Self-pay | Admitting: Internal Medicine

## 2017-05-07 MED ORDER — LEVOTHYROXINE SODIUM 100 MCG PO TABS: ORAL_TABLET | ORAL | 3 refills | Status: DC

## 2017-05-07 NOTE — Progress Notes (Signed)
Subjective:    Patient ID: [name] [name], male    DOB: 1940/10/06, 77 y.o.   MRN: [id number]  HPI  Here to f/u with acute visit with co > 1 wk right post lat neck pain, sharp and dull, worse to turn head to right but not to the left, hears a sort of grinding and popping, has some pain radiating to the right upper back but denies bowel or bladder change, fever, wt loss,  worsening UE and LE pain/numbness/weakness, gait change or falls.   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  Pt is asking for f/u films and tx. Also incidentally with several wks of upper chest dysphagia it seems, Denies worsening reflux, abd pain, n/v, bowel change or blood.  Appetite ok, no wt loss Pt denies chest pain, increased sob or doe, wheezing, orthopnea, PND, increased LE swelling, palpitations, dizziness or syncope.  Wt Readings from Last 3 Encounters:  05/05/17 213 lb (96.6 kg)  03/29/17 210 lb 12.8 oz (95.6 kg)  11/11/16 206 lb (93.4 kg)   Past Medical History:  Diagnosis Date  . Allergic rhinitis   . Allergy   . CAD (coronary artery disease)   . Cataract   . CERUMEN IMPACTION 06/04/2009   5/15    . Colon polyp   . Diverticulosis   . Family hx of colon cancer    brother  . Hyperlipemia   . Hypertension   . Hypothyroidism   . MI (myocardial infarction) ([place]) 1993  . OSA (obstructive sleep apnea)   . Psoriasis   . Sleep apnea    no cpap- had sleep study per pt no OSA   Past Surgical History:  Procedure Laterality Date  . CARDIAC CATHETERIZATION    . COLONOSCOPY    . POLYPECTOMY    . PTCA    . TONSILLECTOMY      reports that he quit smoking about 39 years ago. he has never used smokeless tobacco. He reports that he drinks about 3.6 oz of alcohol per week. He reports that he does not use drugs. family history includes COPD in his father; Cancer in his  brother; Colon cancer in his brother, maternal aunt, maternal aunt, maternal uncle, paternal aunt, paternal grandfather, and unknown relative; Colon polyps in his sister; Coronary artery disease in his mother; Heart disease in his mother; Hypertension in his unknown relative. No Known Allergies Current Outpatient Medications on File Prior to Visit  Medication Sig Dispense Refill  . acetaminophen (TYLENOL) 500 MG tablet Take 500 mg by mouth every 6 (six) hours as needed.    [name] aspirin 81 MG tablet Take 81 mg by mouth daily.      . cholecalciferol (VITAMIN D) 1000 UNITS tablet Take 1,000 Units by mouth daily.    . clopidogrel (PLAVIX) 75 MG tablet TAKE 1 TABLET DAILY 90 tablet 1  . losartan (COZAAR) 100 MG tablet Take 1 tablet (100 mg total) by mouth daily. 90 tablet 3  . metoprolol succinate (TOPROL-XL) 25 MG 24 hr tablet TAKE 1 TABLET BY MOUTH EVERY DAY 90 tablet 3  . Multiple Vitamins-Minerals (CENTRUM SILVER PO) Take 1 tablet by mouth daily.      . niacin (NIASPAN) 1000 MG CR tablet Take 2 tablets (2,000 mg total) by mouth at bedtime. 180 tablet 2  . nitroGLYCERIN (NITROSTAT) 0.4 MG SL  tablet Place 1 tablet (0.4 mg total) under the tongue every 5 (five) minutes as needed for chest pain (3 doses MAX). 25 tablet 4  . Omega-3 Fatty Acids (FISH OIL) 1200 MG CAPS Take 1 capsule by mouth daily.     . simvastatin (ZOCOR) 40 MG tablet TAKE 1 TABLET BY MOUTH EVERY DAY 90 tablet 3   Current Facility-Administered Medications on File Prior to Visit  Medication Dose Route Frequency Provider Last Rate Last Dose  . 0.9 %  sodium chloride infusion  500 mL Intravenous Continuous Pyrtle, Lajuan Lines, MD       Review of Systems  Constitutional: Negative for other unusual diaphoresis or sweats HENT: Negative for ear discharge or swelling Eyes: Negative for other worsening visual disturbances Respiratory: Negative for stridor or other swelling  Gastrointestinal: Negative for worsening distension or other  blood Genitourinary: Negative for retention or other urinary change Musculoskeletal: Negative for other MSK pain or swelling Skin: Negative for color change or other new lesions Neurological: Negative for worsening tremors and other numbness  Psychiatric/Behavioral: Negative for worsening agitation or other fatigue All other system neg per pt    Objective:   Physical Exam BP 136/78   Pulse 66   Temp 97.9 F (36.6 C) (Oral)   Ht 6\' 1"  (1.854 m)   Wt 213 lb (96.6 kg)   SpO2 100%   BMI 28.10 kg/m  VS noted,  Constitutional: Pt appears in NAD HENT: Head: NCAT.  Right Ear: External ear normal.  Left Ear: External ear normal.  Eyes: . Pupils are equal, round, and reactive to light. Conjunctivae and EOM are normal Nose: without d/c or deformity Neck: Neck supple. Gross normal ROM but mild tender right post lateral without swelling or rash Cardiovascular: Normal rate and regular rhythm.   Pulmonary/Chest: Effort normal and breath sounds without rales or wheezing.  Abd:  Soft, NT, ND, + BS, no organomegaly Neurological: Pt is alert. At baseline orientation, motor 5/5 intact Skin: Skin is warm. No rashes, other new lesions, no LE edema Psychiatric: Pt behavior is normal without agitation  No other exam findings    Assessment & Plan:

## 2017-05-07 NOTE — Assessment & Plan Note (Signed)
C/w msk flare DJD or DDD; no radicular symptoms or signs, exam benign, for c spine films, ok for mobic prn, tizanidine prn and predpac asd, and f/u any worsening s/s

## 2017-05-07 NOTE — Assessment & Plan Note (Signed)
Etiology unclear, will check c spine films but more likely other GI related - for GI referral, cont same tx

## 2017-05-07 NOTE — Assessment & Plan Note (Signed)
stable overall by history and exam, recent data reviewed with pt, and pt to continue medical treatment as before,  to f/u any worsening symptoms or concerns BP Readings from Last 3 Encounters:  05/05/17 136/78  03/29/17 116/78  11/11/16 132/78

## 2017-05-12 ENCOUNTER — Telehealth: Payer: Self-pay

## 2017-05-12 ENCOUNTER — Ambulatory Visit (INDEPENDENT_AMBULATORY_CARE_PROVIDER_SITE_OTHER): Payer: Medicare Other | Admitting: Nurse Practitioner

## 2017-05-12 ENCOUNTER — Encounter: Payer: Self-pay | Admitting: Nurse Practitioner

## 2017-05-12 VITALS — BP 138/72 | HR 57 | Ht 73.0 in | Wt 216.0 lb

## 2017-05-12 DIAGNOSIS — R1310 Dysphagia, unspecified: Secondary | ICD-10-CM

## 2017-05-12 NOTE — Telephone Encounter (Signed)
Ok to hold plavix he has no stent

## 2017-05-12 NOTE — Telephone Encounter (Signed)
[facility] [address] [place], [place] [address] Phone: [phone]: [phone]  05/12/2017   FK:[name] [name] DOB:09-Nov-1940 ZGY:[id number]   Dear Dr. Johnsie Cancel,    We have scheduled the above patient for an endoscopic procedure. Our records show that heis on anticoagulation therapy.   Please advise as to how long the patient may come off histherapy of Plavix prior to the EGD procedure, which is scheduled for 05/20/17.  Please fax back/ or route the completed form to [facility] at [phone].   Sincerely,    Thurmon Fair, RMA

## 2017-05-12 NOTE — Patient Instructions (Signed)
If you are age 77 or older, your body mass index should be between 23-30. Your Body mass index is 28.5 kg/m. If this is out of the aforementioned range listed, please consider follow up with your Primary Care Provider.  If you are age 33 or younger, your body mass index should be between 19-25. Your Body mass index is 28.5 kg/m. If this is out of the aformentioned range listed, please consider follow up with your Primary Care Provider.   You have been scheduled for an endoscopy. Please follow written instructions given to you at your visit today. If you use inhalers (even only as needed), please bring them with you on the day of your procedure. Your physician has requested that you go to www.startemmi.com and enter the access code given to you at your visit today. This web site gives a general overview about your procedure. However, you should still follow specific instructions given to you by our office regarding your preparation for the procedure.  You will be contacted by our office prior to your procedure for directions on holding your Plavix.  If you do not hear from our office 1 week prior to your scheduled procedure, please call [phone] to discuss.   Thank you for choosing me and [facility].   Tye Savoy, NP

## 2017-05-12 NOTE — Telephone Encounter (Signed)
   Primary Cardiologist: Jenkins Rouge, MD  Chart reviewed as part of pre-operative protocol coverage. Given past medical history and time since last visit, based on ACC/AHA guidelines, WELLS MABE would be at acceptable risk for the planned procedure without further cardiovascular testing. He may come off of plavix 5 days prior to EGD, but we do recommend continuation of ASA throughout the peri-procedural period, with a plan to resume plavix following the procedure.  Please call with questions.  Murray Hodgkins, NP 05/12/2017, 4:30 PM

## 2017-05-12 NOTE — Telephone Encounter (Signed)
[facility] [address] [place], [place]  [address] Phone:  [phone]   Fax:  [phone]  05/12/2017   RE:      [name] [name] DOB:   Jul 03, 1940 MRN:   [id number]   Dear Dr. Johnsie Cancel,    We have scheduled the above patient for an endoscopic procedure. Our records show that he is on anticoagulation therapy.   Please advise as to how long the patient may come off his therapy of Plavix prior to the EGD procedure, which is scheduled for 05/20/17.  Please fax back/ or route the completed form to [facility] at [phone].   Sincerely,    Thurmon Fair, RMA

## 2017-05-12 NOTE — Progress Notes (Signed)
     Chief Complaint:  Swallowing problems   HPI: Patient is a 77 year old male known to Dr. Hilarie Fredrickson  He has family history colon cancer.  Mr. Mecham comes in today for evaluation of dysphagia.  Approximately 1 year ago while sitting in his office eating patient had food lodged in his esophagus.  He was able to "spit" the food back up.  No major episodes of dysphagia , only mild symptoms until just recently when he had another significant episode while eating salad.  Patient was in [place] visiting family at the time.  He was seen at a [facility] and it was recommended he follow up with his GI provider. Patient has occasional heartburn usually resolved with drinking water. He has no other GI complaints.   Past Medical History:  Diagnosis Date  . Allergic rhinitis   . Allergy   . CAD (coronary artery disease)   . Cataract   . CERUMEN IMPACTION 06/04/2009   5/15    . Colon polyp   . Diverticulosis   . Family hx of colon cancer    brother  . Hyperlipemia   . Hypertension   . Hypothyroidism   . MI (myocardial infarction) ([place]) 1993  . OSA (obstructive sleep apnea)   . Psoriasis   . Sleep apnea    no cpap- had sleep study per pt no OSA    Patient's surgical history, family medical history, social history, medications and allergies were all reviewed in Epic    Physical Exam: BP 138/72   Pulse (!) 57   Ht 6\' 1"  (1.854 m)   Wt 216 lb (98 kg)   BMI 28.50 kg/m   GENERAL: Well-developed white male in NAD PSYCH: :Pleasant, cooperative, normal affect EENT:  conjunctiva pink, mucous membranes moist, neck supple without masses CARDIAC:  RRR, no murmur heard, no peripheral edema PULM: Normal respiratory effort, lungs CTA bilaterally, no wheezing Musculoskeletal:  Normal muscle tone, normal strength NEURO: Alert and oriented x 3, no focal neurologic deficits   ASSESSMENT and PLAN:  1. Pleasant 77 year old with intermittent solid food dysphagia possibly secondary to  esophageal dysmotility.  Need to exclude stricture. -We discussed diagnostic studies including barium swallow with tablet versus upper endoscopy.  Patient would like to proceed with EGD with possible dilation. The risks and benefits of EGD were discussed and the patient agrees to proceed.   2. CAD / remote angioplasty / cath 2011 showed occluded RCA and D1 with 70% mid LAD that was managed medically. He is on chronic Plavix.  -Hold plavix for 5 days before procedure - will instruct when and how to resume after procedure. Patient understands that there is a low but real risk of cardiovascular event such as heart attack, stroke, embolism, thrombosis or ischemia/infarct while off plavix. The patient consents to proceed. Will communicate by phone or EMR with patient's prescribing provider to confirm that holding plavix is reasonable in this case.    Tye Savoy , NP 05/12/2017, 8:47 AM   Addendum: Reviewed and agree with initial management. Pyrtle, Lajuan Lines, MD

## 2017-05-13 ENCOUNTER — Telehealth: Payer: Self-pay

## 2017-05-13 NOTE — Telephone Encounter (Signed)
Per Dr. Johnsie Cancel - okay to hold Plavix.  Patient advised to hold five days prior to procedure.

## 2017-05-16 ENCOUNTER — Encounter: Payer: Medicare Other | Admitting: Internal Medicine

## 2017-05-18 MED FILL — SHINGRIX 50 MCG SUS: 50 | 1 days supply | Qty: 1 | Fill #1

## 2017-05-20 ENCOUNTER — Ambulatory Visit (AMBULATORY_SURGERY_CENTER): Payer: Medicare Other | Admitting: Internal Medicine

## 2017-05-20 ENCOUNTER — Other Ambulatory Visit: Payer: Self-pay

## 2017-05-20 ENCOUNTER — Encounter: Payer: Self-pay | Admitting: Internal Medicine

## 2017-05-20 VITALS — BP 95/68 | HR 59 | Temp 97.1°F | Resp 13 | Ht 73.0 in | Wt 216.0 lb

## 2017-05-20 DIAGNOSIS — K209 Esophagitis, unspecified: Secondary | ICD-10-CM

## 2017-05-20 DIAGNOSIS — K222 Esophageal obstruction: Secondary | ICD-10-CM

## 2017-05-20 DIAGNOSIS — I2510 Atherosclerotic heart disease of native coronary artery without angina pectoris: Secondary | ICD-10-CM | POA: Diagnosis not present

## 2017-05-20 DIAGNOSIS — R1310 Dysphagia, unspecified: Secondary | ICD-10-CM | POA: Diagnosis present

## 2017-05-20 DIAGNOSIS — R55 Syncope and collapse: Secondary | ICD-10-CM | POA: Diagnosis not present

## 2017-05-20 DIAGNOSIS — E669 Obesity, unspecified: Secondary | ICD-10-CM | POA: Diagnosis not present

## 2017-05-20 DIAGNOSIS — I10 Essential (primary) hypertension: Secondary | ICD-10-CM | POA: Diagnosis not present

## 2017-05-20 MED ORDER — SODIUM CHLORIDE 0.9 % IV SOLN: 500.0000 mL | Freq: Once | INTRAVENOUS | Status: DC

## 2017-05-20 MED ORDER — PANTOPRAZOLE SODIUM 40 MG PO TBEC: 40.0000 mg | DELAYED_RELEASE_TABLET | Freq: Every day | ORAL | 5 refills | Status: DC

## 2017-05-20 NOTE — Patient Instructions (Signed)
YOU HAD AN ENDOSCOPIC PROCEDURE TODAY AT THE [facility]:   Refer to the procedure report that was given to you for any specific questions about what was found during the examination.  If the procedure report does not answer your questions, please call your gastroenterologist to clarify.  If you requested that your care partner not be given the details of your procedure findings, then the procedure report has been included in a sealed envelope for you to review at your convenience later.  YOU SHOULD EXPECT: Some feelings of bloating in the abdomen. Passage of more gas than usual.  Walking can help get rid of the air that was put into your GI tract during the procedure and reduce the bloating. If you had a lower endoscopy (such as a colonoscopy or flexible sigmoidoscopy) you may notice spotting of blood in your stool or on the toilet paper. If you underwent a bowel prep for your procedure, you may not have a normal bowel movement for a few days.  Please Note:  You might notice some irritation and congestion in your nose or some drainage.  This is from the oxygen used during your procedure.  There is no need for concern and it should clear up in a day or so.  SYMPTOMS TO REPORT IMMEDIATELY:   Following upper endoscopy (EGD)  Vomiting of blood or coffee ground material  New chest pain or pain under the shoulder blades  Painful or persistently difficult swallowing  New shortness of breath  Fever of 100F or higher  Black, tarry-looking stools  For urgent or emergent issues, a gastroenterologist can be reached at any hour by calling [phone].   DIET:  We do recommend a small meal at first, but then you may proceed to your regular diet.  Drink plenty of fluids but you should avoid alcoholic beverages for 24 hours.  ACTIVITY:  You should plan to take it easy for the rest of today and you should NOT DRIVE or use heavy machinery until tomorrow (because of the sedation medicines used  during the test).    FOLLOW UP: Our staff will call the number listed on your records the next business day following your procedure to check on you and address any questions or concerns that you may have regarding the information given to you following your procedure. If we do not reach you, we will leave a message.  However, if you are feeling well and you are not experiencing any problems, there is no need to return our call.  We will assume that you have returned to your regular daily activities without incident.  If any biopsies were taken you will be contacted by phone or by letter within the next 1-3 weeks.  Please call us at [phone] if you have not heard about the biopsies in 3 weeks.    SIGNATURES/CONFIDENTIALITY: You and/or your care partner have signed paperwork which will be entered into your electronic medical record.  These signatures attest to the fact that that the information above on your After Visit Summary has been reviewed and is understood.  Full responsibility of the confidentiality of this discharge information lies with you and/or your care-partner. 

## 2017-05-20 NOTE — Progress Notes (Signed)
Report to PACU, RN, vss, BBS= Clear.  

## 2017-05-20 NOTE — Op Note (Signed)
[facility] Patient Name: Stephanos Fan Procedure Date: 05/20/2017 9:55 AM MRN: [id number] Endoscopist: Jerene Bears , MD Age: 77 Referring MD:  Date of Birth: 02-07-1941 Gender: Male Account #: [id number] Procedure:                Upper GI endoscopy Indications:              Dysphagia Medicines:                Monitored Anesthesia Care Procedure:                Pre-Anesthesia Assessment:                           - Prior to the procedure, a History and Physical                            was performed, and patient medications and                            allergies were reviewed. The patient's tolerance of                            previous anesthesia was also reviewed. The risks                            and benefits of the procedure and the sedation                            options and risks were discussed with the patient.                            All questions were answered, and informed consent                            was obtained. Prior Anticoagulants: The patient has                            taken Plavix (clopidogrel), last dose was 5 days                            prior to procedure. ASA Grade Assessment: III - A                            patient with severe systemic disease. After                            reviewing the risks and benefits, the patient was                            deemed in satisfactory condition to undergo the                            procedure.  After obtaining informed consent, the endoscope was                            passed under direct vision. Throughout the                            procedure, the patient's blood pressure, pulse, and                            oxygen saturations were monitored continuously. The                            Endoscope was introduced through the mouth, and                            advanced to the second part of duodenum. The upper                            GI  endoscopy was accomplished without difficulty.                            The patient tolerated the procedure well. Scope In: Scope Out: Findings:                 Moderately severe esophagitis was found 37 cm from                            the incisors with luminal narrowing/stricture.                            There was resistance to passage of the adult upper                            endoscope and thereafter there were 2 mucosal rents                            in the stricture with self-limited oozing. For this                            reason further dilation was not performed.                           A 3 cm hiatal hernia was found. The proximal extent                            of the gastric folds (end of tubular esophagus) was                            37 cm from the incisors. The hiatal narrowing was                            40 cm from the incisors. The Z-line was 37 cm from  the incisors.                           The entire examined stomach was normal.                           The examined duodenum was normal. Complications:            No immediate complications. Estimated Blood Loss:     Estimated blood loss was minimal. 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                           - 3 cm hiatal hernia.                           - Normal stomach.                           - Normal examined duodenum.                           - No specimens collected. Recommendation:           - Patient has a contact number available for                            emergencies. The signs and symptoms of potential                            delayed complications were discussed with the                            patient. Return to normal activities tomorrow.                            Written discharge instructions were provided to the                             patient.                           - Soft diet.                           - Continue present medications.                           - Begin pantoprazole 40 mg daily (30 min before                            breakfast daily) for reflux esophagitis.                           -  Repeat upper endoscopy in 8-12 weeks to check                            healing and consider dilation.                           - Restart Plavix today and normal dose. Jerene Bears, MD 05/20/2017 10:20:10 AM This report has been signed electronically.

## 2017-05-20 NOTE — Progress Notes (Signed)
No soy or egg allegyHistory reviewed todayHistory reviewed today

## 2017-05-23 ENCOUNTER — Telehealth: Payer: Self-pay

## 2017-05-23 NOTE — Telephone Encounter (Signed)
  Follow up Call-  Call back number 05/20/2017 02/19/2016  Post procedure Call Back phone  # [phone]  Permission to leave phone message Yes Yes  Some recent data might be hidden     Patient questions:  Do you have a fever, pain , or abdominal swelling? No. Pain Score  0 *  Have you tolerated food without any problems? Yes.    Have you been able to return to your normal activities? Yes.    Do you have any questions about your discharge instructions: Diet   No. Medications  No. Follow up visit  No.  Do you have questions or concerns about your Care? No.  Actions: * If pain score is 4 or above: No action needed, pain <4.  No problems noted per pt. maw

## 2017-05-26 ENCOUNTER — Telehealth: Payer: Self-pay | Admitting: *Deleted

## 2017-05-26 NOTE — Telephone Encounter (Signed)
Dr. Vena Rua advice noted.  See TE from 05-12-17 for Plavix instructions  Attempted to reach pt- unable to reach on his cell phone and left message on home phone to call office back.

## 2017-05-26 NOTE — Telephone Encounter (Signed)
Dr. Hilarie Fredrickson,  I am going to get Mr. Knick scheduled for his repeat EGD.  Are you ok with holding his Plavix for 5 days again for this next procedure?  Thanks, [employer]

## 2017-05-26 NOTE — Telephone Encounter (Signed)
Ok to hold Plavix based on prior/recent recommendation from prescribing provider assuming no change to medical history of recent/new medical issues

## 2017-05-27 NOTE — Telephone Encounter (Signed)
Spoke with pt- PV and EGD appointment made.  Pt needed an appointment after 08-08-17 d/t his work.

## 2017-06-29 ENCOUNTER — Other Ambulatory Visit: Payer: Self-pay | Admitting: Internal Medicine

## 2017-06-29 NOTE — Telephone Encounter (Signed)
Prescribed by Dr. Jenny Reichmann when he saw in in January, please advise

## 2017-08-03 ENCOUNTER — Ambulatory Visit (AMBULATORY_SURGERY_CENTER): Payer: Self-pay

## 2017-08-03 VITALS — Ht 73.0 in | Wt 213.4 lb

## 2017-08-03 DIAGNOSIS — R1310 Dysphagia, unspecified: Secondary | ICD-10-CM

## 2017-08-03 DIAGNOSIS — R1319 Other dysphagia: Secondary | ICD-10-CM

## 2017-08-03 NOTE — Progress Notes (Signed)
Per pt, no allergies to soy or egg products.Pt not taking any weight loss meds or using  O2 at home.  Pt refused emmi video. 

## 2017-08-04 ENCOUNTER — Encounter: Payer: Self-pay | Admitting: Internal Medicine

## 2017-08-10 ENCOUNTER — Ambulatory Visit (AMBULATORY_SURGERY_CENTER): Payer: Medicare Other | Admitting: Internal Medicine

## 2017-08-10 ENCOUNTER — Other Ambulatory Visit: Payer: Self-pay

## 2017-08-10 ENCOUNTER — Encounter: Payer: Self-pay | Admitting: Internal Medicine

## 2017-08-10 ENCOUNTER — Encounter: Payer: Medicare Other | Admitting: Internal Medicine

## 2017-08-10 VITALS — BP 140/69 | HR 52 | Temp 96.6°F | Resp 9 | Ht 73.0 in | Wt 213.0 lb

## 2017-08-10 DIAGNOSIS — R1310 Dysphagia, unspecified: Secondary | ICD-10-CM

## 2017-08-10 DIAGNOSIS — K222 Esophageal obstruction: Secondary | ICD-10-CM | POA: Diagnosis not present

## 2017-08-10 MED ORDER — SODIUM CHLORIDE 0.9 % IV SOLN: 500.0000 mL | Freq: Once | INTRAVENOUS | Status: DC

## 2017-08-10 NOTE — Op Note (Signed)
[facility] Patient Name: [name] [name] Procedure Date: 08/10/2017 8:22 AM MRN: [id number] Endoscopist: Jerene Bears , MD Age: 77 Referring MD:  Date of Birth: 1941/04/12 Gender: Male Account #: [id number] Procedure:                Upper GI endoscopy Indications:              Dysphagia, Follow-up of esophagitis Medicines:                Monitored Anesthesia Care Procedure:                Pre-Anesthesia Assessment:                           - Prior to the procedure, a History and Physical                            was performed, and patient medications and                            allergies were reviewed. The patient's tolerance of                            previous anesthesia was also reviewed. The risks                            and benefits of the procedure and the sedation                            options and risks were discussed with the patient.                            All questions were answered, and informed consent                            was obtained. Prior Anticoagulants: The patient has                            taken Plavix (clopidogrel), last dose was 5 days                            prior to procedure. ASA Grade Assessment: III - A                            patient with severe systemic disease. After                            reviewing the risks and benefits, the patient was                            deemed in satisfactory condition to undergo the                            procedure.  After obtaining informed consent, the endoscope was                            passed under direct vision. Throughout the                            procedure, the patient's blood pressure, pulse, and                            oxygen saturations were monitored continuously. The                            Model GIF-HQ190 [id number]) scope was introduced                            through the mouth, and advanced to the second part                          of duodenum. The upper GI endoscopy was                            accomplished without difficulty. The patient                            tolerated the procedure well. Scope In: Scope Out: Findings:                 A 3 cm hiatal hernia was found. The proximal extent                            of the gastric folds (end of tubular esophagus) was                            37 cm from the incisors. The hiatal narrowing was                            40 cm from the incisors. The Z-line was 37 cm from                            the incisors.                           Normal mucosa was found in the entire esophagus.                           A low-grade of narrowing Schatzki ring was found at                            the gastroesophageal junction. A TTS dilator was                            passed through the scope. Dilation with a  13.5-14.5-15.5 mm balloon dilator was performed to                            15.5 mm. The dilation site was examined and showed                            moderate improvement in luminal narrowing.                           The entire examined stomach was normal.                           The examined duodenum was normal. Complications:            No immediate complications. Estimated Blood Loss:     Estimated blood loss: none. Impression:               - 3 cm hiatal hernia.                           - Normal mucosa was found in the entire esophagus.                            Esophagitis has healed.                           - Low-grade of narrowing Schatzki ring. Dilated to                            15.5 mm                           - Normal stomach.                           - Normal examined duodenum.                           - No specimens collected. Recommendation:           - Patient has a contact number available for                            emergencies. The signs and symptoms of potential                             delayed complications were discussed with the                            patient. Return to normal activities tomorrow.                            Written discharge instructions were provided to the                            patient.                           -  Resume previous diet.                           - Continue present medications.                           - Repeat upper endoscopy as needed for repeat                            dilation.                           - Resume Plavix (clopidogrel) at prior dose today. Jerene Bears, MD 08/10/2017 8:48:26 AM This report has been signed electronically.

## 2017-08-10 NOTE — Progress Notes (Signed)
Called to room to assist during endoscopic procedure.  Patient ID and intended procedure confirmed with present staff. Received instructions for my participation in the procedure from the performing physician.  

## 2017-08-10 NOTE — Patient Instructions (Signed)
*  Handout given to pt's wife on dilation diet and GERD protocal.   YOU HAD AN ENDOSCOPIC PROCEDURE TODAY AT [facility]:   Refer to the procedure report that was given to you for any specific questions about what was found during the examination.  If the procedure report does not answer your questions, please call your gastroenterologist to clarify.  If you requested that your care partner not be given the details of your procedure findings, then the procedure report has been included in a sealed envelope for you to review at your convenience later.  YOU SHOULD EXPECT: Some feelings of bloating in the abdomen. Passage of more gas than usual.  Walking can help get rid of the air that was put into your GI tract during the procedure and reduce the bloating. If you had a lower endoscopy (such as a colonoscopy or flexible sigmoidoscopy) you may notice spotting of blood in your stool or on the toilet paper. If you underwent a bowel prep for your procedure, you may not have a normal bowel movement for a few days.  Please Note:  You might notice some irritation and congestion in your nose or some drainage.  This is from the oxygen used during your procedure.  There is no need for concern and it should clear up in a day or so.  SYMPTOMS TO REPORT IMMEDIATELY:    Following upper endoscopy (EGD)  Vomiting of blood or coffee ground material  New chest pain or pain under the shoulder blades  Painful or persistently difficult swallowing  New shortness of breath  Fever of 100F or higher  Black, tarry-looking stools  For urgent or emergent issues, a gastroenterologist can be reached at any hour by calling [phone].   DIET:  See dilation diet!  Drink plenty of fluids but you should avoid alcoholic beverages for 24 hours.  ACTIVITY:  You should plan to take it easy for the rest of today and you should NOT DRIVE or use heavy machinery until tomorrow (because of the sedation medicines  used during the test).    FOLLOW UP: Our staff will call the number listed on your records the next business day following your procedure to check on you and address any questions or concerns that you may have regarding the information given to you following your procedure. If we do not reach you, we will leave a message.  However, if you are feeling well and you are not experiencing any problems, there is no need to return our call.  We will assume that you have returned to your regular daily activities without incident.  If any biopsies were taken you will be contacted by phone or by letter within the next 1-3 weeks.  Please call us at [phone] if you have not heard about the biopsies in 3 weeks.    SIGNATURES/CONFIDENTIALITY: You and/or your care partner have signed paperwork which will be entered into your electronic medical record.  These signatures attest to the fact that that the information above on your After Visit Summary has been reviewed and is understood.  Full responsibility of the confidentiality of this discharge information lies with you and/or your care-partner.

## 2017-08-10 NOTE — Progress Notes (Signed)
A and O x3. Report to RN. Tolerated MAC anesthesia well.Teeth unchanged after procedure.

## 2017-08-10 NOTE — Progress Notes (Signed)
Pt's states no medical or surgical changes since previsit or office visit. 

## 2017-08-11 ENCOUNTER — Telehealth: Payer: Self-pay | Admitting: *Deleted

## 2017-08-11 NOTE — Telephone Encounter (Signed)
  Follow up Call-  Call back number 08/10/2017 05/20/2017 02/19/2016  Post procedure Call Back phone  # [phone]  Permission to leave phone message Yes Yes Yes  Some recent data might be hidden     Patient questions:  Do you have a fever, pain , or abdominal swelling? No. Pain Score  0 *  Have you tolerated food without any problems? Yes.    Have you been able to return to your normal activities? Yes.    Do you have any questions about your discharge instructions: Diet   No. Medications  No. Follow up visit  No.  Do you have questions or concerns about your Care? No.  Actions: * If pain score is 4 or above: No action needed, pain <4.

## 2017-08-27 ENCOUNTER — Other Ambulatory Visit: Payer: Self-pay | Admitting: Internal Medicine

## 2017-08-31 IMAGING — DX DG LUMBAR SPINE 2-3V
3 series · 3 of 3 positions shown · non-contrast
Comparison: No recent prior.

CLINICAL DATA: Low back pain.

EXAM:
LUMBAR SPINE - 2-3 VIEW

[l-spine ap]
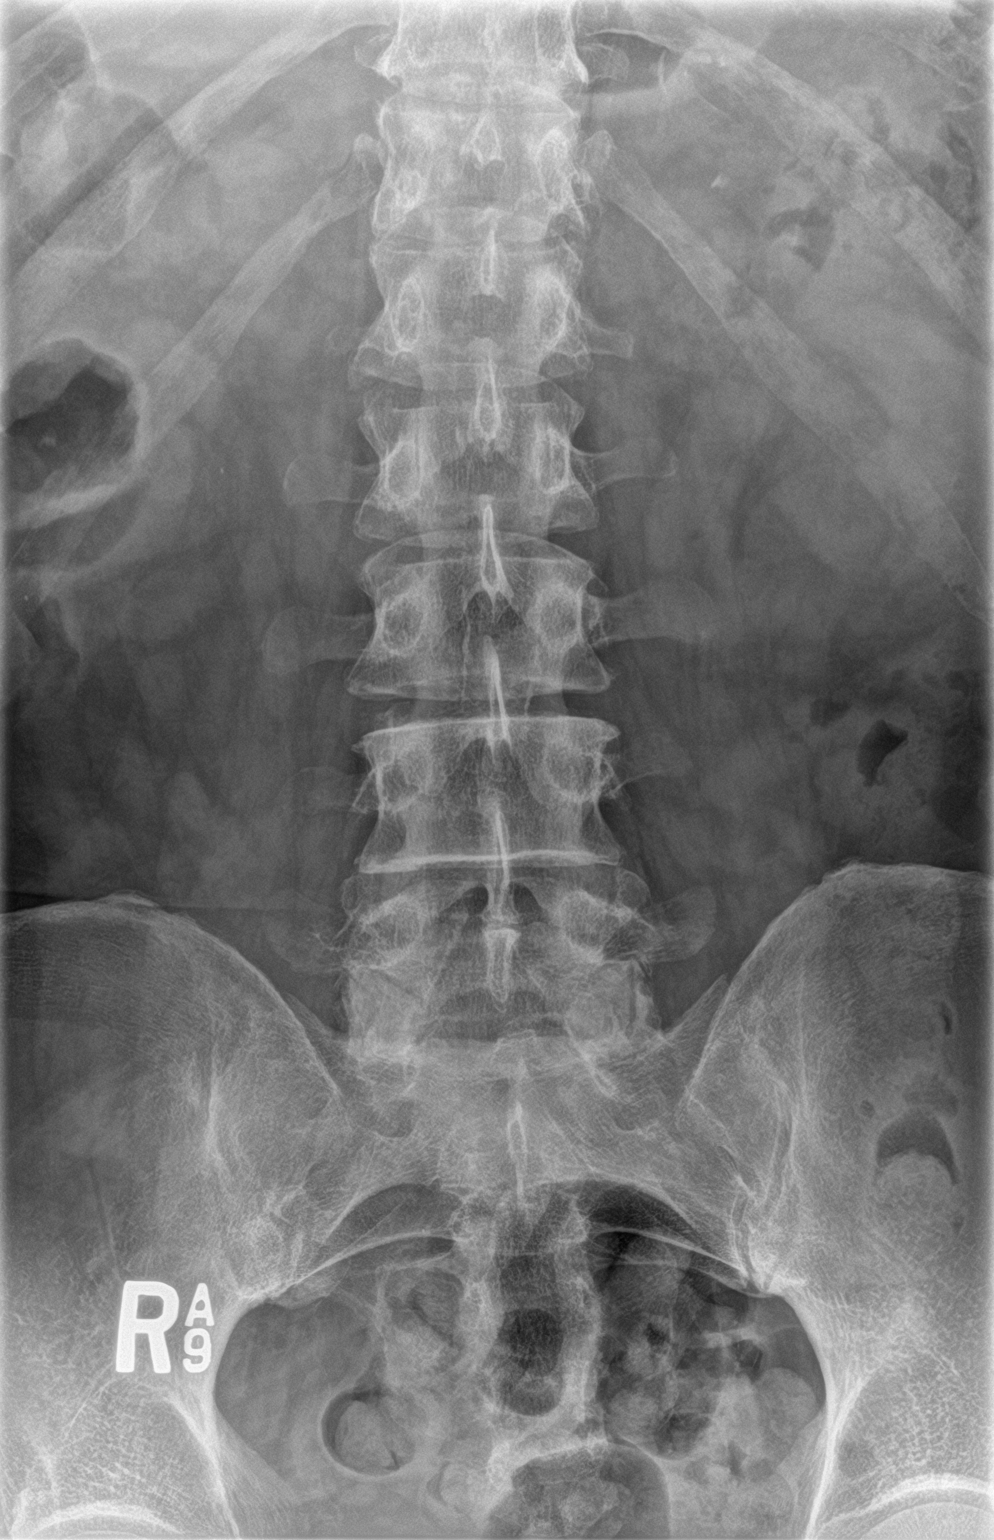

[l-spine lat]
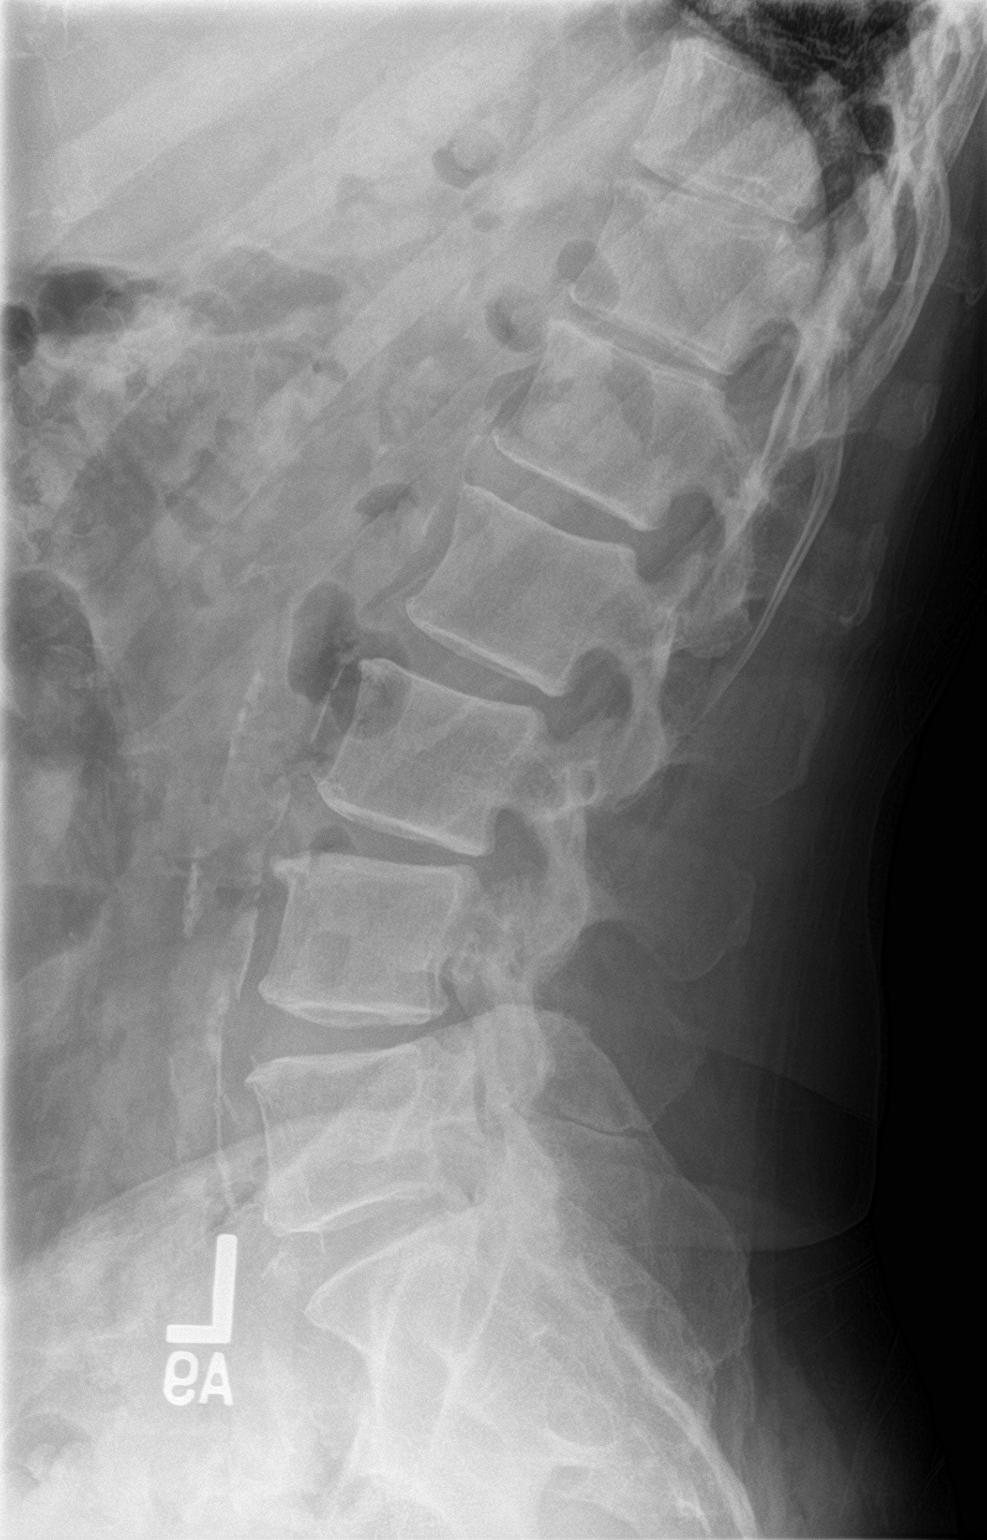

[l-spine spot]
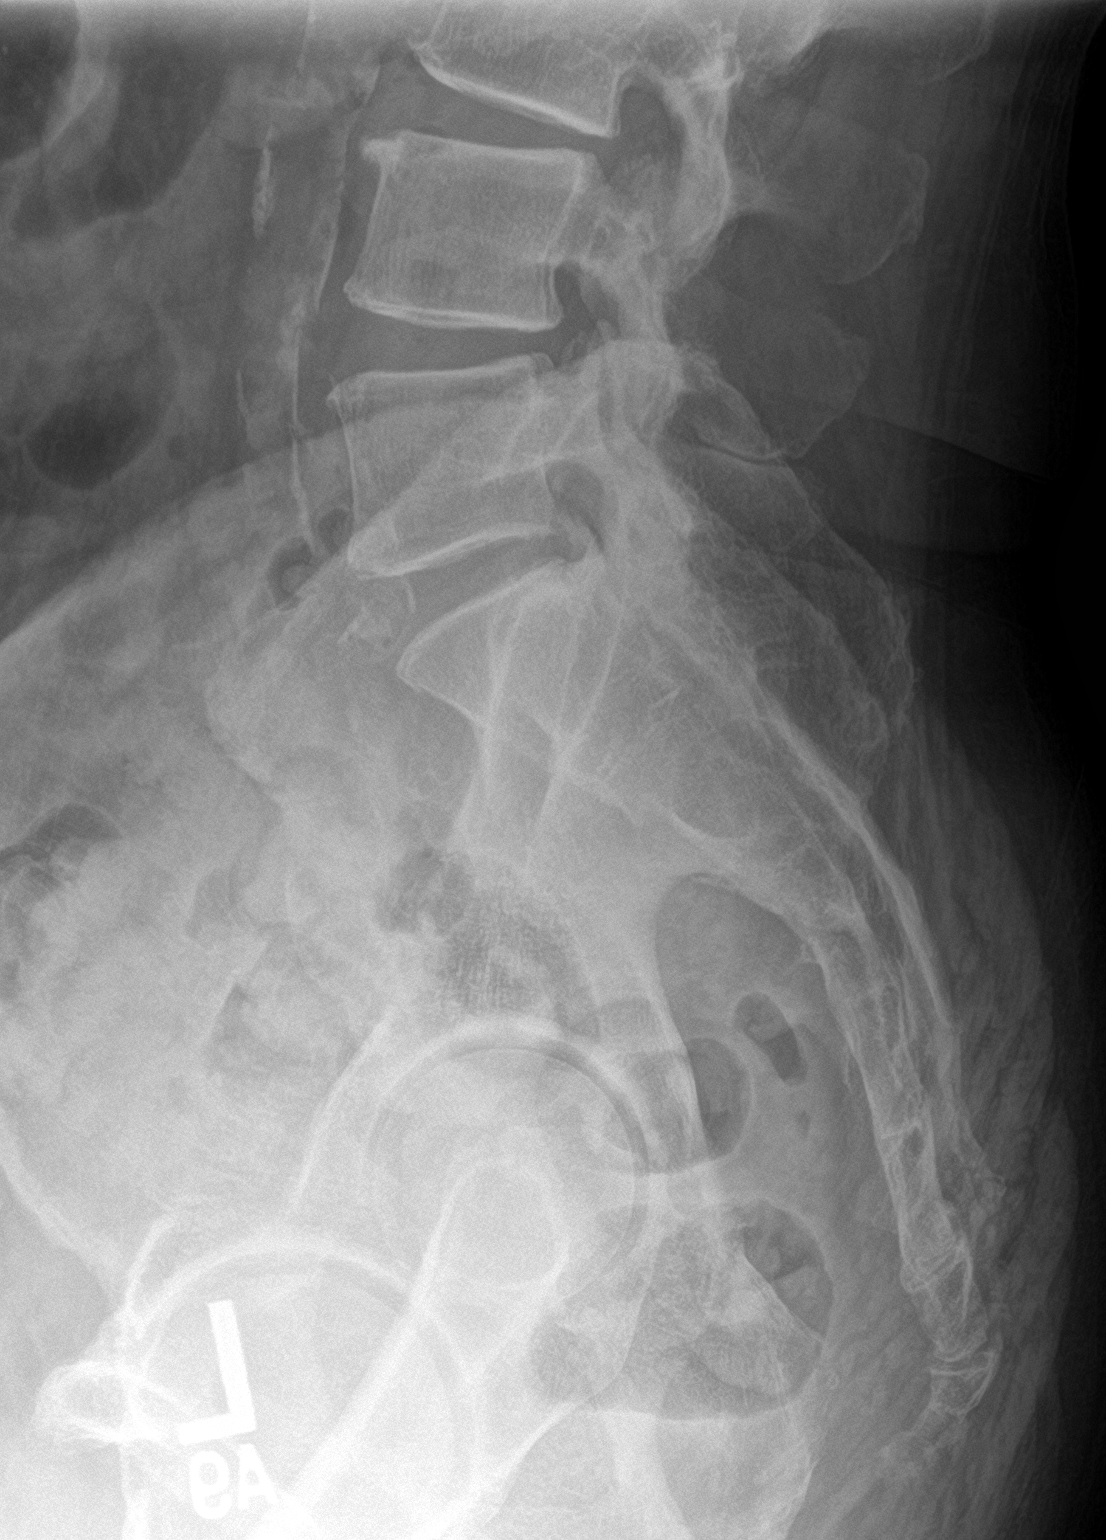

[3 of 3 positions shown; findings below may reference images not displayed]

FINDINGS: No acute bony abnormality identified. Normal alignment. Normal
mineralization. Aortoiliac atherosclerotic vascular calcification.
IMPRESSION: 1. No acute bony abnormality.

2. Aortoiliac atherosclerotic vascular disease.

## 2017-09-18 ENCOUNTER — Other Ambulatory Visit: Payer: Self-pay | Admitting: Cardiovascular Disease

## 2017-09-26 ENCOUNTER — Other Ambulatory Visit: Payer: Self-pay | Admitting: Internal Medicine

## 2017-11-03 DIAGNOSIS — Z961 Presence of intraocular lens: Secondary | ICD-10-CM | POA: Diagnosis not present

## 2017-11-03 DIAGNOSIS — H02834 Dermatochalasis of left upper eyelid: Secondary | ICD-10-CM | POA: Diagnosis not present

## 2017-11-03 DIAGNOSIS — H3552 Pigmentary retinal dystrophy: Secondary | ICD-10-CM | POA: Diagnosis not present

## 2017-11-03 DIAGNOSIS — H02831 Dermatochalasis of right upper eyelid: Secondary | ICD-10-CM | POA: Diagnosis not present

## 2017-11-03 DIAGNOSIS — H10413 Chronic giant papillary conjunctivitis, bilateral: Secondary | ICD-10-CM | POA: Diagnosis not present

## 2017-11-03 DIAGNOSIS — H353111 Nonexudative age-related macular degeneration, right eye, early dry stage: Secondary | ICD-10-CM | POA: Diagnosis not present

## 2017-11-03 DIAGNOSIS — D3132 Benign neoplasm of left choroid: Secondary | ICD-10-CM | POA: Diagnosis not present

## 2017-11-07 ENCOUNTER — Other Ambulatory Visit: Payer: Self-pay | Admitting: Internal Medicine

## 2017-11-30 ENCOUNTER — Encounter: Payer: Self-pay | Admitting: Internal Medicine

## 2017-11-30 ENCOUNTER — Ambulatory Visit (INDEPENDENT_AMBULATORY_CARE_PROVIDER_SITE_OTHER): Payer: Medicare Other | Admitting: Internal Medicine

## 2017-11-30 VITALS — BP 142/78 | HR 54 | Temp 97.9°F | Ht 73.0 in | Wt 216.0 lb

## 2017-11-30 DIAGNOSIS — M5440 Lumbago with sciatica, unspecified side: Secondary | ICD-10-CM | POA: Diagnosis not present

## 2017-11-30 DIAGNOSIS — N320 Bladder-neck obstruction: Secondary | ICD-10-CM

## 2017-11-30 DIAGNOSIS — I2510 Atherosclerotic heart disease of native coronary artery without angina pectoris: Secondary | ICD-10-CM | POA: Diagnosis not present

## 2017-11-30 DIAGNOSIS — I10 Essential (primary) hypertension: Secondary | ICD-10-CM | POA: Diagnosis not present

## 2017-11-30 DIAGNOSIS — E559 Vitamin D deficiency, unspecified: Secondary | ICD-10-CM | POA: Diagnosis not present

## 2017-11-30 DIAGNOSIS — E034 Atrophy of thyroid (acquired): Secondary | ICD-10-CM | POA: Diagnosis not present

## 2017-11-30 MED ORDER — AZELASTINE HCL 0.05 % OP SOLN: 1.0000 [drp] | Freq: Two times a day (BID) | OPHTHALMIC | 6 refills | Status: AC

## 2017-11-30 NOTE — Progress Notes (Signed)
Subjective:  Patient ID: [name] [name], male    DOB: July 03, 1940  Age: 77 y.o. MRN: [id number]  CC: No chief complaint on file.   HPI JEFFIE SPIVACK presents for HTN, OA, hypothyroidism  Outpatient Medications Prior to Visit  Medication Sig Dispense Refill  . acetaminophen (TYLENOL) 500 MG tablet Take 500 mg by mouth every 6 (six) hours as needed.    [name] aspirin 81 MG tablet Take 81 mg by mouth daily.      . cholecalciferol (VITAMIN D) 1000 UNITS tablet Take 4,000 Units by mouth daily.     . clopidogrel (PLAVIX) 75 MG tablet TAKE 1 TABLET DAILY 90 tablet 1  . levothyroxine (SYNTHROID, LEVOTHROID) 100 MCG tablet TAKE 1 TABLET BY MOUTH EVERY DAY 90 tablet 3  . losartan (COZAAR) 100 MG tablet Take 1 tablet (100 mg total) by mouth daily. 90 tablet 3  . meloxicam (MOBIC) 7.5 MG tablet Take 1 tablet (7.5 mg total) by mouth daily as needed for pain. Annual appt is due must see provider for future refills 30 tablet 0  . metoprolol succinate (TOPROL-XL) 25 MG 24 hr tablet TAKE 1 TABLET BY MOUTH EVERY DAY 90 tablet 3  . Multiple Vitamins-Minerals (CENTRUM SILVER PO) Take 1 tablet by mouth daily.      . niacin (NIASPAN) 1000 MG CR tablet Take 2 tablets (2,000 mg total) by mouth at bedtime. 180 tablet 2  . nitroGLYCERIN (NITROSTAT) 0.4 MG SL tablet Place 1 tablet (0.4 mg total) under the tongue every 5 (five) minutes as needed for chest pain (3 doses MAX). 25 tablet 4  . Omega-3 Fatty Acids (FISH OIL) 1200 MG CAPS Take 1 capsule by mouth daily.     . pantoprazole (PROTONIX) 40 MG tablet TAKE 1 TABLET (40 MG TOTAL) BY MOUTH DAILY. TAKE 30 MINUTES BEFORE BREAKFAST. 30 tablet 5  . simvastatin (ZOCOR) 40 MG tablet TAKE 1 TABLET BY MOUTH EVERY DAY 90 tablet 3   Facility-Administered Medications Prior to Visit  Medication Dose Route Frequency Provider Last Rate Last Dose  . 0.9 %  sodium chloride infusion  500 mL Intravenous Once Pyrtle, Lajuan Lines, MD      . 0.9 %  sodium chloride infusion  500 mL  Intravenous Once Pyrtle, Lajuan Lines, MD        ROS: Review of Systems  Constitutional: Negative for appetite change, fatigue and unexpected weight change.  HENT: Negative for congestion, nosebleeds, sneezing, sore throat and trouble swallowing.   Eyes: Negative for itching and visual disturbance.  Respiratory: Negative for cough.   Cardiovascular: Negative for chest pain, palpitations and leg swelling.  Gastrointestinal: Negative for abdominal distention, blood in stool, diarrhea and nausea.  Genitourinary: Negative for frequency and hematuria.  Musculoskeletal: Negative for back pain, gait problem, joint swelling and neck pain.  Skin: Negative for rash.  Neurological: Negative for dizziness, tremors, speech difficulty and weakness.  Psychiatric/Behavioral: Negative for agitation, dysphoric mood, sleep disturbance and suicidal ideas. The patient is not nervous/anxious.     Objective:  BP (!) 142/78 (BP Location: Left Arm, Patient Position: Sitting, Cuff Size: Normal)   Pulse (!) 54   Temp 97.9 F (36.6 C) (Oral)   Ht 6\' 1"  (1.854 m)   Wt 216 lb (98 kg)   SpO2 98%   BMI 28.50 kg/m   BP Readings from Last 3 Encounters:  11/30/17 (!) 142/78  08/10/17 140/69  05/20/17 95/68    Wt Readings from Last 3 Encounters:  11/30/17 216  lb (98 kg)  08/10/17 213 lb (96.6 kg)  08/03/17 213 lb 6.4 oz (96.8 kg)    Physical Exam  Constitutional: He is oriented to person, place, and time. He appears well-developed. No distress.  NAD  HENT:  Mouth/Throat: Oropharynx is clear and moist.  Eyes: Pupils are equal, round, and reactive to light. Conjunctivae are normal.  Neck: Normal range of motion. No JVD present. No thyromegaly present.  Cardiovascular: Normal rate, regular rhythm, normal heart sounds and intact distal pulses. Exam reveals no gallop and no friction rub.  No murmur heard. Pulmonary/Chest: Effort normal and breath sounds normal. No respiratory distress. He has no wheezes. He has no  rales. He exhibits no tenderness.  Abdominal: Soft. Bowel sounds are normal. He exhibits no distension and no mass. There is no tenderness. There is no rebound and no guarding.  Genitourinary: Rectum normal and prostate normal. Rectal exam shows guaiac negative stool.  Musculoskeletal: Normal range of motion. He exhibits no edema or tenderness.  Lymphadenopathy:    He has no cervical adenopathy.  Neurological: He is alert and oriented to person, place, and time. He has normal reflexes. No cranial nerve deficit. He exhibits normal muscle tone. He displays a negative Romberg sign. Coordination and gait normal.  Skin: Skin is warm and dry. No rash noted.  Psychiatric: He has a normal mood and affect. His behavior is normal. Judgment and thought content normal.    Lab Results  Component Value Date   WBC 6.8 10/22/2016   HGB 13.8 10/22/2016   HCT 41.0 10/22/2016   PLT 195.0 10/22/2016   GLUCOSE 89 10/22/2016   CHOL 136 10/22/2016   TRIG 98.0 10/22/2016   HDL 55.20 10/22/2016   LDLCALC 61 10/22/2016   ALT 24 10/22/2016   AST 20 10/22/2016   NA 143 10/22/2016   K 4.3 10/22/2016   CL 105 10/22/2016   CREATININE 1.04 10/22/2016   BUN 14 10/22/2016   CO2 27 10/22/2016   TSH 3.45 10/22/2016   PSA 0.68 10/22/2016   INR 1.0 ratio 03/06/2010   HGBA1C 5.8 08/28/2013    Dg Cervical Spine Complete  Result Date: 05/05/2017 CLINICAL DATA:  RIGHT-sided neck pain for 1 week worse when turning head to the RIGHT EXAM: CERVICAL SPINE - COMPLETE 4+ VIEW COMPARISON:  05/29/2014 FINDINGS: Prevertebral soft tissues normal thickness. Bones appear slightly demineralized. Anterior endplate spur formation identified and inferior C2, inferior C3, and at the C5-C6 and C6-C7 endplates. Vertebral body heights maintained without fracture or subluxation. Encroachment upon RIGHT C3-C4 and C4-C5 neural foramina by combination of facet and uncovertebral spurs. Mild scattered facet degenerative changes. Lung apices  clear. 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 No new osseous abnormalities. Electronically Signed   By: Lavonia Dana M.D.   On: 05/05/2017 16:52    Assessment & Plan:   There are no diagnoses linked to this encounter.   No orders of the defined types were placed in this encounter.    Follow-up: No follow-ups on file.  Walker Kehr, MD

## 2017-11-30 NOTE — Assessment & Plan Note (Signed)
Metoprolol, Losartan, ASA, Simvastatin

## 2017-11-30 NOTE — Assessment & Plan Note (Signed)
No relapse 

## 2017-11-30 NOTE — Assessment & Plan Note (Signed)
Metoprolol, Losartan

## 2017-11-30 NOTE — Assessment & Plan Note (Signed)
TSH 

## 2017-12-01 ENCOUNTER — Other Ambulatory Visit (INDEPENDENT_AMBULATORY_CARE_PROVIDER_SITE_OTHER): Payer: Medicare Other

## 2017-12-01 DIAGNOSIS — I2510 Atherosclerotic heart disease of native coronary artery without angina pectoris: Secondary | ICD-10-CM | POA: Diagnosis not present

## 2017-12-01 DIAGNOSIS — E559 Vitamin D deficiency, unspecified: Secondary | ICD-10-CM

## 2017-12-01 DIAGNOSIS — I10 Essential (primary) hypertension: Secondary | ICD-10-CM

## 2017-12-01 DIAGNOSIS — N320 Bladder-neck obstruction: Secondary | ICD-10-CM | POA: Diagnosis not present

## 2017-12-01 DIAGNOSIS — E034 Atrophy of thyroid (acquired): Secondary | ICD-10-CM | POA: Diagnosis not present

## 2017-12-01 LAB — URINALYSIS
Bilirubin Urine: NEGATIVE
Hgb urine dipstick: NEGATIVE
KETONES UR: NEGATIVE
Leukocytes, UA: NEGATIVE
Nitrite: NEGATIVE
SPECIFIC GRAVITY, URINE: 1.015 (ref 1.000–1.030)
TOTAL PROTEIN, URINE-UPE24: NEGATIVE
URINE GLUCOSE: NEGATIVE
UROBILINOGEN UA: 0.2 (ref 0.0–1.0)
pH: 6.5 (ref 5.0–8.0)

## 2017-12-01 LAB — CBC WITH DIFFERENTIAL/PLATELET
BASOS ABS: 0 10*3/uL (ref 0.0–0.1)
BASOS PCT: 0.7 % (ref 0.0–3.0)
EOS ABS: 0.4 10*3/uL (ref 0.0–0.7)
Eosinophils Relative: 6.1 % — ABNORMAL HIGH (ref 0.0–5.0)
HCT: 38.3 % — ABNORMAL LOW (ref 39.0–52.0)
Hemoglobin: 13 g/dL (ref 13.0–17.0)
Lymphocytes Relative: 30.7 % (ref 12.0–46.0)
Lymphs Abs: 2.1 10*3/uL (ref 0.7–4.0)
MCHC: 33.9 g/dL (ref 30.0–36.0)
MCV: 87.8 fl (ref 78.0–100.0)
MONOS PCT: 10.8 % (ref 3.0–12.0)
Monocytes Absolute: 0.7 10*3/uL (ref 0.1–1.0)
NEUTROS ABS: 3.5 10*3/uL (ref 1.4–7.7)
NEUTROS PCT: 51.7 % (ref 43.0–77.0)
PLATELETS: 214 10*3/uL (ref 150.0–400.0)
RBC: 4.36 Mil/uL (ref 4.22–5.81)
RDW: 13.8 % (ref 11.5–15.5)
WBC: 6.8 10*3/uL (ref 4.0–10.5)

## 2017-12-01 LAB — LIPID PANEL
CHOL/HDL RATIO: 3
Cholesterol: 128 mg/dL (ref 0–200)
HDL: 38.4 mg/dL — AB (ref >39.00)
LDL Cholesterol: 60 mg/dL (ref 0–99)
NonHDL: 90.05
TRIGLYCERIDES: 152 mg/dL — AB (ref 0.0–149.0)
VLDL: 30.4 mg/dL (ref 0.0–40.0)

## 2017-12-01 LAB — VITAMIN D 25 HYDROXY (VIT D DEFICIENCY, FRACTURES): VITD: 36.96 ng/mL (ref 30.00–100.00)

## 2017-12-01 LAB — BASIC METABOLIC PANEL
BUN: 15 mg/dL (ref 6–23)
CO2: 28 meq/L (ref 19–32)
Calcium: 9.6 mg/dL (ref 8.4–10.5)
Chloride: 104 mEq/L (ref 96–112)
Creatinine, Ser: 0.99 mg/dL (ref 0.40–1.50)
GFR: 77.98 mL/min (ref >60.00)
GLUCOSE: 96 mg/dL (ref 70–99)
Potassium: 4.1 mEq/L (ref 3.5–5.1)
SODIUM: 141 meq/L (ref 135–145)

## 2017-12-01 LAB — TSH: TSH: 4.63 u[IU]/mL — ABNORMAL HIGH (ref 0.35–4.50)

## 2017-12-01 LAB — HEPATIC FUNCTION PANEL
ALT: 25 U/L (ref 0–53)
AST: 25 U/L (ref 0–37)
Albumin: 4.3 g/dL (ref 3.5–5.2)
Alkaline Phosphatase: 39 U/L (ref 39–117)
BILIRUBIN TOTAL: 1 mg/dL (ref 0.2–1.2)
Bilirubin, Direct: 0.2 mg/dL (ref 0.0–0.3)
Total Protein: 6.6 g/dL (ref 6.0–8.3)

## 2017-12-01 LAB — PSA: PSA: 0.68 ng/mL (ref 0.10–4.00)

## 2017-12-04 ENCOUNTER — Other Ambulatory Visit: Payer: Self-pay | Admitting: Internal Medicine

## 2017-12-04 DIAGNOSIS — E034 Atrophy of thyroid (acquired): Secondary | ICD-10-CM

## 2017-12-05 ENCOUNTER — Other Ambulatory Visit: Payer: Self-pay | Admitting: Internal Medicine

## 2017-12-14 ENCOUNTER — Other Ambulatory Visit: Payer: Self-pay

## 2017-12-14 MED ORDER — LOSARTAN POTASSIUM 100 MG PO TABS: 100.0000 mg | ORAL_TABLET | Freq: Every day | ORAL | 3 refills | Status: DC

## 2017-12-22 DIAGNOSIS — Z23 Encounter for immunization: Secondary | ICD-10-CM | POA: Diagnosis not present

## 2018-01-10 ENCOUNTER — Other Ambulatory Visit (INDEPENDENT_AMBULATORY_CARE_PROVIDER_SITE_OTHER): Payer: Medicare Other

## 2018-01-10 DIAGNOSIS — E034 Atrophy of thyroid (acquired): Secondary | ICD-10-CM

## 2018-01-10 LAB — T4, FREE: Free T4: 0.87 ng/dL (ref 0.60–1.60)

## 2018-01-10 LAB — TSH: TSH: 4.3 u[IU]/mL (ref 0.35–4.50)

## 2018-01-24 ENCOUNTER — Other Ambulatory Visit: Payer: Self-pay | Admitting: Internal Medicine

## 2018-02-17 ENCOUNTER — Other Ambulatory Visit: Payer: Self-pay | Admitting: Cardiovascular Disease

## 2018-03-29 NOTE — Progress Notes (Signed)
Patient ID: [name] [name], male   DOB: 1940-04-30, 77 y.o.   MRN: [id number]     77 y.o. f/u for CAD - chronic occlusion of the right coronary had cutting balloon angioplasty to the diagonal branch of the LAD. This occluded in 2008. F/U cath for abnormal ETT 11/11 showed occluded RCA and D1 with 70% mid LAD that Dr Olevia Perches decided to manage medically.  He has been doing fairly well   Myovue 05/03/16 low risk  MPI images personally reviewed    Nuclear stress EF: 64%.  Blood pressure demonstrated a hypertensive response to exercise.  Upsloping ST segment depression ST segment depression of 2 mm was noted during stress in the V4 and V5 leads, beginning at 9 minutes of stress, and returning to baseline after less than 1 minute of recovery.  Defect 1: There is a small defect of moderate severity present in the basal inferior and mid inferior location consistent with probable soft tissue attenuation, cannot rule out subendicardial scar. No ischemia  This is a low risk study.  No chest pain Nitro  refilled   Tax season very stressful for him and he may retire  Son in [employer] and is catholic priest  Other son is a Chief Executive Officer in [place] He is walking / jogging 4-5 miles/day with no claudication or angina    ROS: Denies fever, malais, weight loss, blurry vision, decreased visual acuity, cough, sputum, SOB, hemoptysis, pleuritic pain, palpitaitons, heartburn, abdominal pain, melena, lower extremity edema, claudication, or rash.  All other systems reviewed and negative  General: BP 140/62   Pulse 63   Ht 6\' 1"  (1.854 m)   Wt 202 lb 4 oz (91.7 kg)   SpO2 98%   BMI 26.68 kg/m  Affect appropriate Healthy:  appears stated age 24: normal Neck supple with no adenopathy JVP normal no bruits no thyromegaly Lungs clear with no wheezing and good diaphragmatic motion Heart:  S1/S2 no murmur, no rub, gallop or click PMI normal Abdomen: benighn, BS positve, no tenderness, no AAA no bruit.  No  HSM or HJR Distal pulses intact with no bruits No edema Neuro non-focal Skin warm and dry No muscular weakness     Allergies  Patient has no known allergies.  Electrocardiogram:  SR LAFB RBBB  10/09/13  01/23/15  NSR rate 60 LAD RBBB LVH no change  02/28/17 SR RBBB LAFB LVH 03/31/18 SR rate 59 LAD RBBB no changes   Assessment and Plan  CAD: Occluded D1 and RCA with moderate LAD disease non ischemic myovue 05/03/16 Continue medical RX  Very active with no angina    HTN: Well controlled.  Continue current medications and low sodium Dash type diet.    Thyroid  Continue replacement TSH normal 4.3 on 01/10/18  HLD:  Continue meds LDL at goal 60 on 12/01/17  PVD:  Calcium seen on lumbar film f/u US with >50% left iliac disease no claudication observe   FU with me in  6 months

## 2018-03-31 ENCOUNTER — Encounter: Payer: Self-pay | Admitting: Cardiovascular Disease

## 2018-03-31 ENCOUNTER — Ambulatory Visit (INDEPENDENT_AMBULATORY_CARE_PROVIDER_SITE_OTHER): Payer: Medicare Other | Admitting: Cardiovascular Disease

## 2018-03-31 VITALS — BP 140/62 | HR 63 | Ht 73.0 in | Wt 202.2 lb

## 2018-03-31 DIAGNOSIS — I2510 Atherosclerotic heart disease of native coronary artery without angina pectoris: Secondary | ICD-10-CM

## 2018-03-31 DIAGNOSIS — I10 Essential (primary) hypertension: Secondary | ICD-10-CM | POA: Diagnosis not present

## 2018-03-31 DIAGNOSIS — E785 Hyperlipidemia, unspecified: Secondary | ICD-10-CM | POA: Diagnosis not present

## 2018-03-31 NOTE — Patient Instructions (Signed)
Medication Instructions:   If you need a refill on your cardiac medications before your next appointment, please call your pharmacy.   Lab work:  If you have labs (blood work) drawn today and your tests are completely normal, you will receive your results only by: [name] MyChart Message (if you have MyChart) OR . A paper copy in the mail If you have any lab test that is abnormal or we need to change your treatment, we will call you to review the results.  Testing/Procedures: NONE ordered today  Follow-Up: At [facility], you and your health needs are our priority.  As part of our continuing mission to provide you with exceptional heart care, we have created designated Provider Care Teams.  These Care Teams include your primary Cardiologist (physician) and Advanced Practice Providers (APPs -  Physician Assistants and Nurse Practitioners) who all work together to provide you with the care you need, when you need it. You will need a follow up appointment in:  6 months.  Please call our office 2 months in advance to schedule this appointment.  You may see Jenkins Rouge, MD or one of the following Advanced Practice Providers on your designated Care Team: Richardson Dopp, PA-C [place], [place] . Daune Perch, NP

## 2018-04-03 ENCOUNTER — Other Ambulatory Visit: Payer: Self-pay | Admitting: Cardiovascular Disease

## 2018-04-11 IMAGING — DX DG CERVICAL SPINE COMPLETE 4+V
6 series · 6 of 6 positions shown · non-contrast
Comparison: 05/29/2014

CLINICAL DATA: RIGHT-sided neck pain for 1 week worse when turning
head to the RIGHT

EXAM:
CERVICAL SPINE - COMPLETE 4+ VIEW

[c-spine lat]
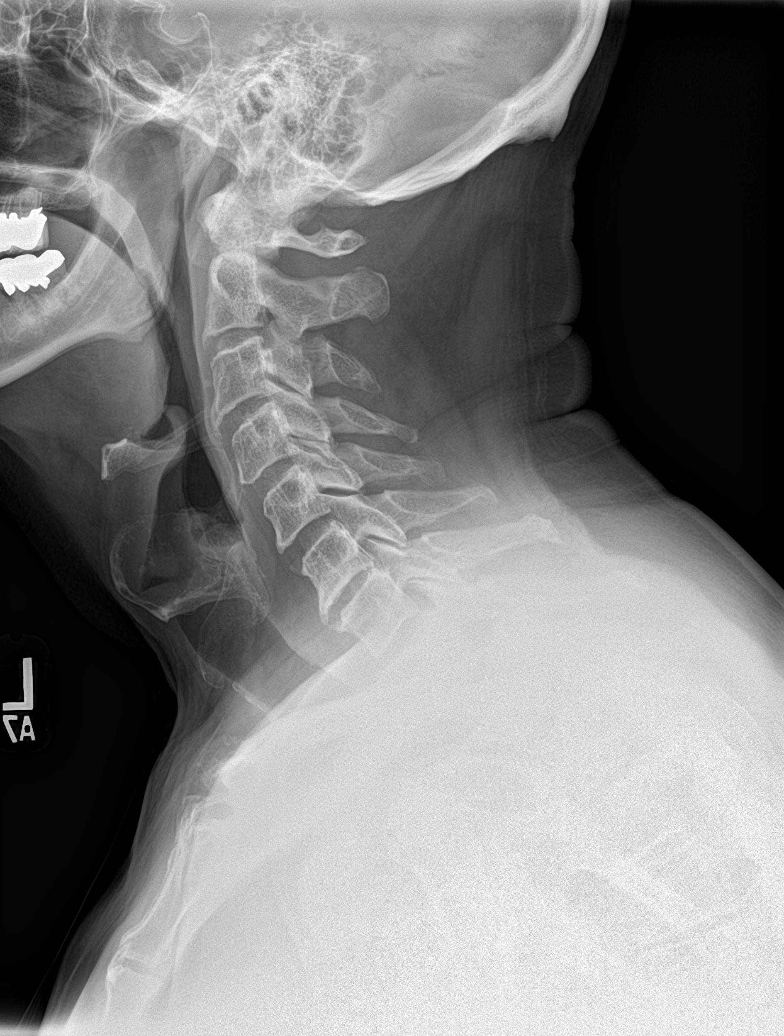

[c-spine obl (1 of 2)]
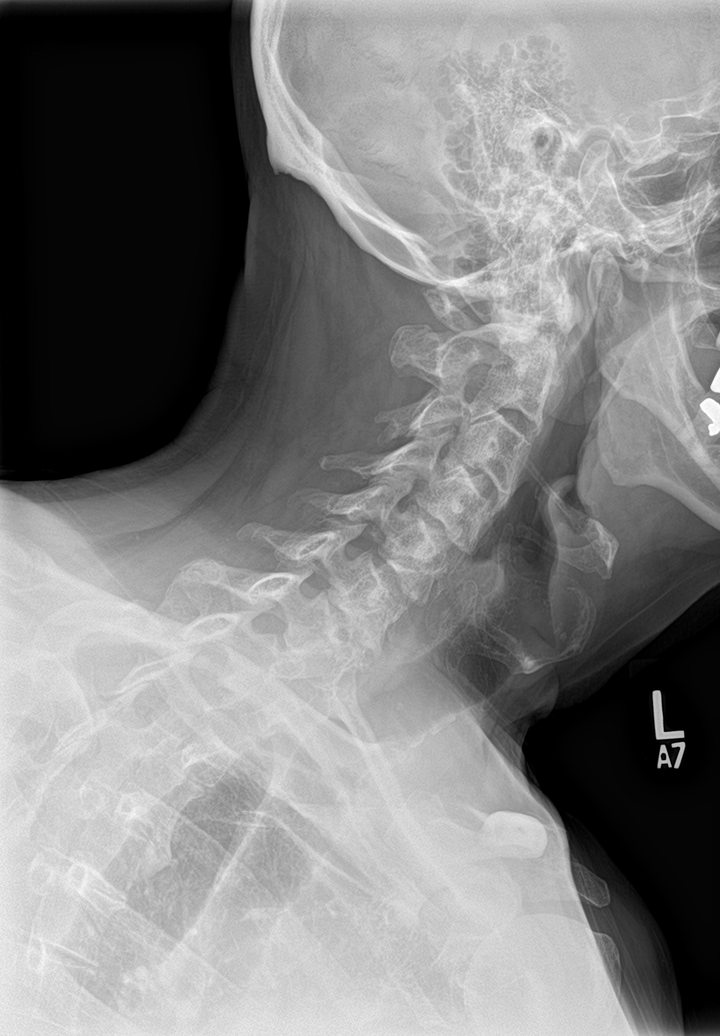

[c-spine obl (2 of 2)]
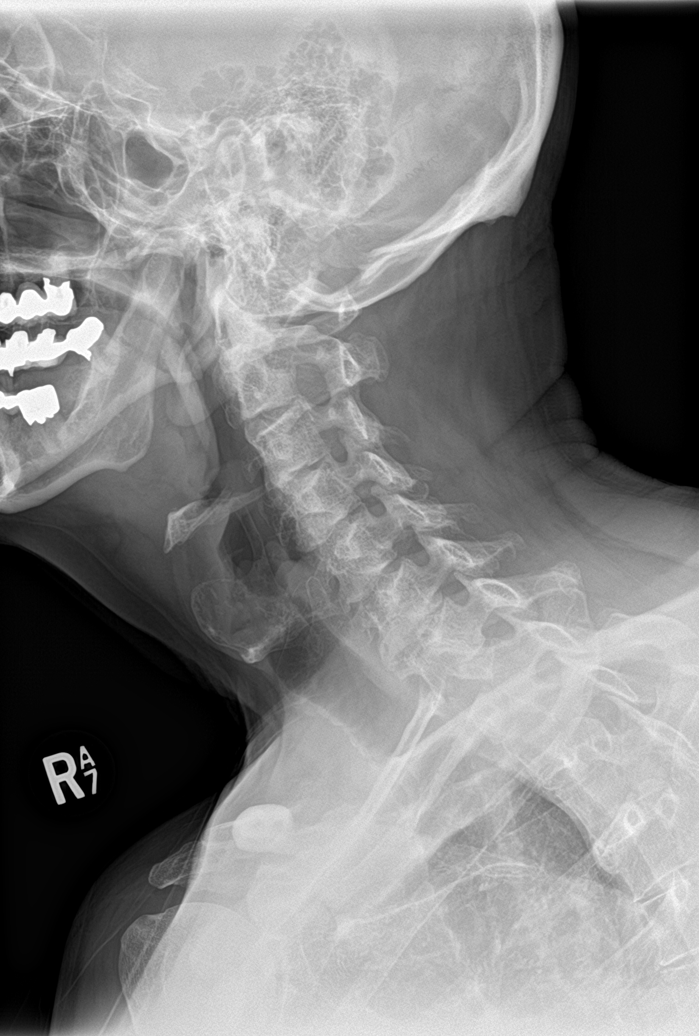

[c-spine ap]
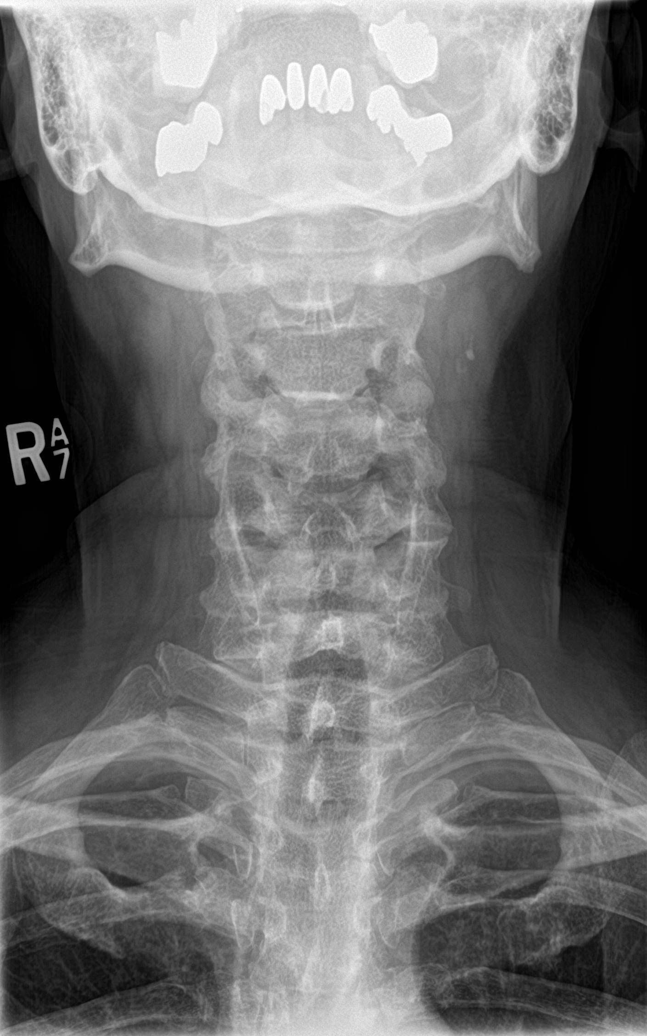

[c-spine open mouth (1 of 2)]
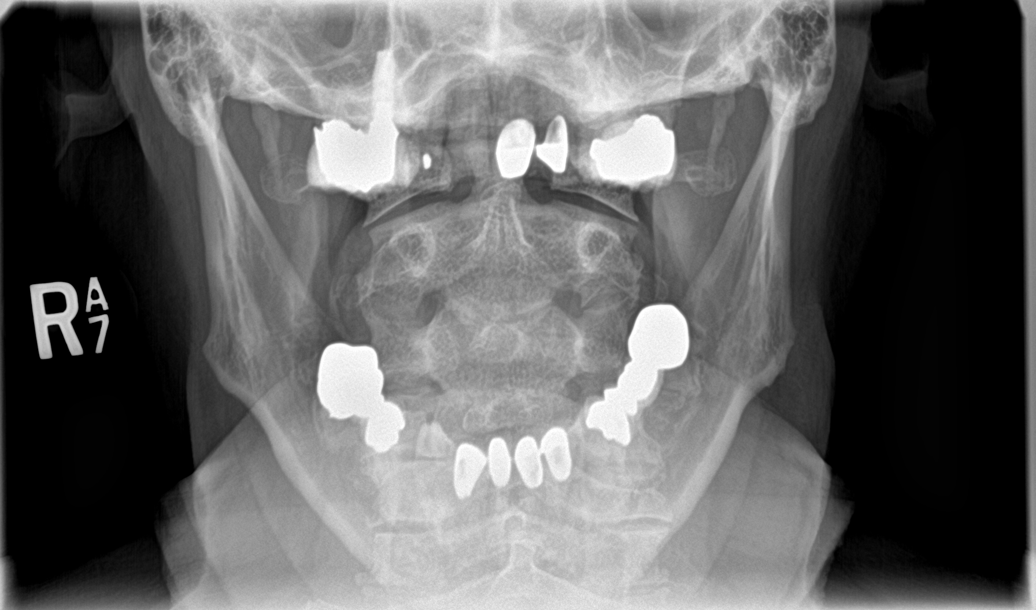

[c-spine open mouth (2 of 2)]
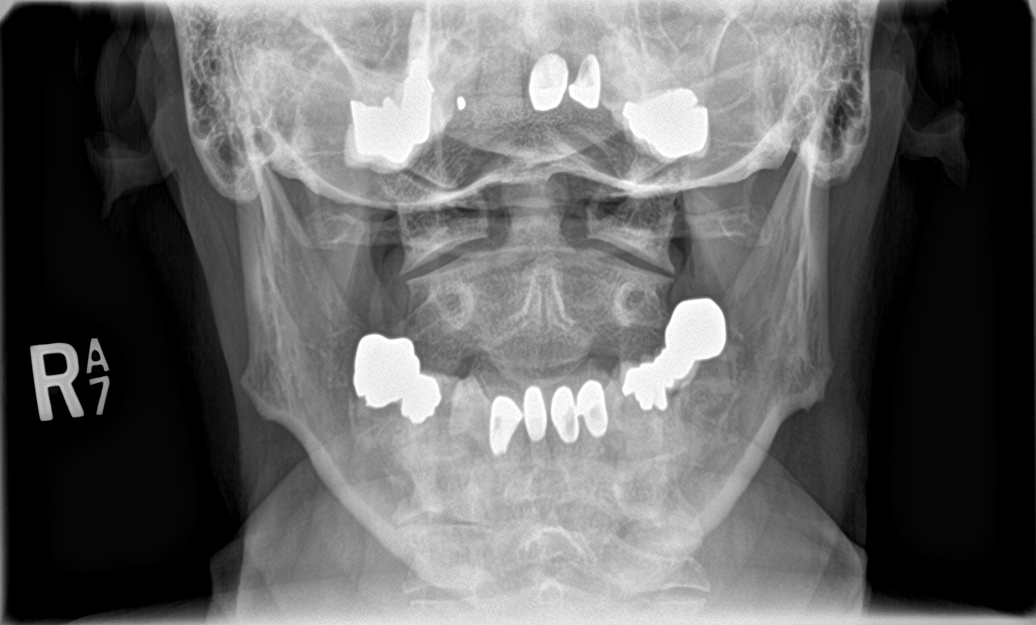

[6 of 6 positions shown; findings below may reference images not displayed]

FINDINGS: Prevertebral soft tissues normal thickness.

Bones appear slightly demineralized.

Anterior endplate spur formation identified and inferior C2,
inferior C3, and at the C5-C6 and C6-C7 endplates.

Vertebral body heights maintained without fracture or subluxation.

Encroachment upon RIGHT C3-C4 and C4-C5 neural foramina by
combination of facet and uncovertebral spurs.

Mild scattered facet degenerative changes.

Lung apices clear.
IMPRESSION: Mild degenerative disc and facet disease changes of the cervical
spine with encroachment upon the RIGHT C3-C4 and C4-C5 neural
foramina by facet and uncovertebral spur formation, similar to prior
exam.

No new osseous abnormalities.

## 2018-04-29 ENCOUNTER — Other Ambulatory Visit: Payer: Self-pay | Admitting: Internal Medicine

## 2018-05-17 ENCOUNTER — Ambulatory Visit: Payer: Medicare Other | Admitting: Internal Medicine

## 2018-05-17 ENCOUNTER — Ambulatory Visit (INDEPENDENT_AMBULATORY_CARE_PROVIDER_SITE_OTHER): Payer: Medicare Other | Admitting: Family Medicine

## 2018-05-17 ENCOUNTER — Ambulatory Visit: Payer: Self-pay

## 2018-05-17 VITALS — BP 122/80 | HR 63 | Ht 73.0 in

## 2018-05-17 DIAGNOSIS — M79642 Pain in left hand: Secondary | ICD-10-CM | POA: Diagnosis not present

## 2018-05-17 DIAGNOSIS — R2231 Localized swelling, mass and lump, right upper limb: Secondary | ICD-10-CM

## 2018-05-17 NOTE — Assessment & Plan Note (Signed)
Appear to be more vascular in nature.  Likely recent injury, discussed proper lifting mechanics, avoiding certain activities and monitoring.  Patient is having no pain, extra-articular, no true mass appreciated.  Worsening symptoms will consider the possibility of aspiration but at this moment we will continue to monitor.

## 2018-05-17 NOTE — Patient Instructions (Addendum)
Good to see you  Ice is your friend You know the drill  Lets watch it for now  Make an appointment in 6 weeks or otherwise See me again when you need me  Can call [phone] as well for faster response.  Happy New Year!

## 2018-05-17 NOTE — Progress Notes (Signed)
[name] [address] [place], [place] [address] Phone: [phone] Subjective:    I'm seeing this patient by the request  of:    CC: Right hand cyst  [id number]  [name] [name] is a 78 y.o. male coming in with complaint of right hand pain. Was seen in 2018 for left hand ganglion cyst. Patient states that within the past 2 months a cyst has developed on ventral side of hand.  Patient states it is nontender, full movement, wants to know what it is.     Past Medical History:  Diagnosis Date  . Allergic rhinitis   . Allergy   . Arthritis    in neck  . CAD (coronary artery disease)   . Cataract    Had surg 08/2016  . CERUMEN IMPACTION 06/04/2009   5/15    . Colon polyp   . Diverticulosis   . Family hx of colon cancer    brother  . Hyperlipemia   . Hypertension   . Hypothyroidism   . MI (myocardial infarction) ([place]) 1993/2007   no stents  . OSA (obstructive sleep apnea)   . Psoriasis   . Sleep apnea    no cpap- had sleep study per pt no OSA   Past Surgical History:  Procedure Laterality Date  . CARDIAC CATHETERIZATION  1993  . COLONOSCOPY    . POLYPECTOMY    . PTCA    . TONSILLECTOMY     Social History   Socioeconomic History  . Marital status: Married    Spouse name: Not on file  . Number of children: 2  . Years of education: Not on file  . Highest education level: Not on file  Occupational History  . 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   Social Needs  . Financial resource strain: Not on file  . Food insecurity:    Worry: Not on file    Inability: Not on file  . Transportation needs:    Medical: Not on file    Non-medical: Not on file  Tobacco Use  . Smoking status: Former Smoker    Last attempt to quit: 04/1978    Years since quitting: 40.0  . Smokeless tobacco: Never Used  Substance and Sexual Activity  . Alcohol use: Yes    Alcohol/week: 6.0 standard drinks      Types: 6 Cans of beer per week  . Drug use: No  . Sexual activity: Not Currently  Lifestyle  . Physical activity:    Days per week: Not on file    Minutes per session: Not on file  . Stress: Not on file  Relationships  . Social connections:    Talks on phone: Not on file    Gets together: Not on file    Attends religious service: Not on file    Active member of club or organization: Not on file    Attends meetings of clubs or organizations: Not on file    Relationship status: Not on file  Other Topics Concern  . Not on file  Social History Narrative  . Not on file   No Known Allergies Family History  Problem Relation Age of Onset  . Colon cancer Brother        died 52  . Coronary artery disease Mother   . Heart disease Mother   . Colon polyps Sister        x2  .  COPD Father   . Colon cancer Paternal Grandfather   . Cancer Brother   . Bone cancer Brother   . Hypertension Unknown   . Colon cancer Unknown   . Colon cancer Maternal Aunt   . Colon cancer Maternal Uncle   . Colon cancer Maternal Aunt   . Colon cancer Paternal Aunt   . Esophageal cancer Neg Hx   . Rectal cancer Neg Hx   . Stomach cancer Neg Hx        Past medical history, social, surgical and family history all reviewed in electronic medical record.  No pertanent information unless stated regarding to the chief complaint.   Review of Systems:  No headache, visual changes, nausea, vomiting, diarrhea, constipation, dizziness, abdominal pain, skin rash, fevers, chills, night sweats, weight loss, swollen lymph nodes, body aches, joint swelling, muscle aches, chest pain, shortness of breath, mood changes.   Objective  Blood pressure 122/80, pulse 63, height 6\' 1"  (1.854 m), SpO2 98 %. Systems examined below as of    General: No apparent distress alert and oriented x3 mood and affect normal, dressed appropriately.  HEENT: Pupils equal, extraocular movements intact  Respiratory: Patient's speak in  full sentences and does not appear short of breath  Cardiovascular: No lower extremity edema, non tender, no erythema  Skin: Warm dry intact with no signs of infection or rash on extremities or on axial skeleton.  Abdomen: Soft nontender  Neuro: Cranial nerves II through XII are intact, neurovascularly intact in all extremities with 2+ DTRs and 2+ pulses.  Lymph: No lymphadenopathy of posterior or anterior cervical chain or axillae bilaterally.  Gait normal with good balance and coordination.  MSK:  Non tender with full range of motion and good stability and symmetric strength and tone of shoulders, elbows, wrist, hip, knee and ankles bilaterally.  Mild arthritic changes of multiple joints  Right hand exam shows that over the fourth palmar aspect there seems to be an inclusion body cyst.  Nontender, full range of motion of the hand and fingers.  Does not appear to be in the tendon sheath.  Limited musculoskeletal ultrasound was performed and interpreted by Lyndal Pulley  Limited ultrasound shows the patient does have what appears to be a varicose vein in the area.  Seems to be acute injury to the vascular in this area.  Extra-articular.  There is a cyst surrounding the area but very small of less than 1-1/2 mm Impression: Small vascular injury superficial fourth MCP    Impression and Recommendations:     This case required medical decision making of moderate complexity. The above documentation has been reviewed and is accurate and complete Hulan Saas.      Note: This dictation was prepared with Dragon dictation along with smaller phrase technology. Any transcriptional errors that result from this process are unintentional.

## 2018-05-20 ENCOUNTER — Other Ambulatory Visit: Payer: Self-pay | Admitting: Cardiovascular Disease

## 2018-07-24 ENCOUNTER — Other Ambulatory Visit: Payer: Self-pay | Admitting: Internal Medicine

## 2018-10-03 ENCOUNTER — Telehealth: Payer: Self-pay

## 2018-10-03 NOTE — Telephone Encounter (Signed)
Virtual Visit Pre-Appointment Phone Call  "(Name), I am calling you today to discuss your upcoming appointment. We are currently trying to limit exposure to the virus that causes COVID-19 by seeing patients at home rather than in the office."  1. "What is the BEST phone number to call the day of the visit?" - include this in appointment notes  2. "Do you have or have access to (through a family member/friend) a smartphone with video capability that we can use for your visit?" a. If yes - list this number in appt notes as "cell" (if different from BEST phone #) and list the appointment type as a VIDEO visit in appointment notes b. If no - list the appointment type as a PHONE visit in appointment notes  3. Confirm consent - "In the setting of the current Covid19 crisis, you are scheduled for a (phone or video) visit with your provider on (date) at (time).  Just as we do with many in-office visits, in order for you to participate in this visit, we must obtain consent.  If you'd like, I can send this to your mychart (if signed up) or email for you to review.  Otherwise, I can obtain your verbal consent now.  All virtual visits are billed to your insurance company just like a normal visit would be.  By agreeing to a virtual visit, we'd like you to understand that the technology does not allow for your provider to perform an examination, and thus may limit your provider's ability to fully assess your condition. If your provider identifies any concerns that need to be evaluated in person, we will make arrangements to do so.  Finally, though the technology is pretty good, we cannot assure that it will always work on either your or our end, and in the setting of a video visit, we may have to convert it to a phone-only visit.  In either situation, we cannot ensure that we have a secure connection.  Are you willing to proceed? YES  4. Advise patient to be prepared - "Two hours prior to your appointment, go  ahead and check your blood pressure, pulse, oxygen saturation, and your weight (if you have the equipment to check those) and write them all down. When your visit starts, your provider will ask you for this information. If you have an Apple Watch or Kardia device, please plan to have heart rate information ready on the day of your appointment. Please have a pen and paper handy nearby the day of the visit as well."  5. Give patient instructions for MyChart download to smartphone OR [email or web address] as below if video visit (depending on what platform provider is using)  6. Inform patient they will receive a phone call 15 minutes prior to their appointment time (may be from unknown caller ID) so they should be prepared to answer    TELEPHONE CALL NOTE  [name] [name] has been deemed a candidate for a follow-up tele-health visit to limit community exposure during the Covid-19 pandemic. I spoke with the patient via phone to ensure availability of phone/video source, confirm preferred email & phone number, and discuss instructions and expectations.  I reminded [name] [name] to be prepared with any vital sign and/or heart rhythm information that could potentially be obtained via home monitoring, at the time of his visit. I reminded [name] [name] to expect a phone call prior to his visit.  Michaelyn Barter, RN 10/03/2018 8:29 AM   IF  USING DOXIMITY or DOXY.ME - The patient will receive a link just prior to their visit by text.     FULL LENGTH CONSENT FOR TELE-HEALTH VISIT   I hereby voluntarily request, consent and authorize [facility] and its employed or contracted physicians, physician assistants, nurse practitioners or other licensed health care professionals (the Practitioner), to provide me with telemedicine health care services (the "Services") as deemed necessary by the treating Practitioner. I acknowledge and consent to receive the Services by the Practitioner via telemedicine. I  understand that the telemedicine visit will involve communicating with the Practitioner through live audiovisual communication technology and the disclosure of certain medical information by electronic transmission. I acknowledge that I have been given the opportunity to request an in-person assessment or other available alternative prior to the telemedicine visit and am voluntarily participating in the telemedicine visit.  I understand that I have the right to withhold or withdraw my consent to the use of telemedicine in the course of my care at any time, without affecting my right to future care or treatment, and that the Practitioner or I may terminate the telemedicine visit at any time. I understand that I have the right to inspect all information obtained and/or recorded in the course of the telemedicine visit and may receive copies of available information for a reasonable fee.  I understand that some of the potential risks of receiving the Services via telemedicine include:  [name] Delay or interruption in medical evaluation due to technological equipment failure or disruption; . Information transmitted may not be sufficient (e.g. poor resolution of images) to allow for appropriate medical decision making by the Practitioner; and/or  . In rare instances, security protocols could fail, causing a breach of personal health information.  Furthermore, I acknowledge that it is my responsibility to provide information about my medical history, conditions and care that is complete and accurate to the best of my ability. I acknowledge that Practitioner's advice, recommendations, and/or decision may be based on factors not within their control, such as incomplete or inaccurate data provided by me or distortions of diagnostic images or specimens that may result from electronic transmissions. I understand that the practice of medicine is not an exact science and that Practitioner makes no warranties or guarantees  regarding treatment outcomes. I acknowledge that I will receive a copy of this consent concurrently upon execution via email to the email address I last provided but may also request a printed copy by calling the office of [facility].    I understand that my insurance will be billed for this visit.   I have read or had this consent read to me. . I understand the contents of this consent, which adequately explains the benefits and risks of the Services being provided via telemedicine.  . I have been provided ample opportunity to ask questions regarding this consent and the Services and have had my questions answered to my satisfaction. . I give my informed consent for the services to be provided through the use of telemedicine in my medical care  By participating in this telemedicine visit I agree to the above.

## 2018-10-03 NOTE — Progress Notes (Signed)
Virtual Visit via Video Note   This visit type was conducted due to national recommendations for restrictions regarding the COVID-19 Pandemic (e.g. social distancing) in an effort to limit this patient's exposure and mitigate transmission in our community.  Due to his co-morbid illnesses, this patient is at least at moderate risk for complications without adequate follow up.  This format is felt to be most appropriate for this patient at this time.  All issues noted in this document were discussed and addressed.  A limited physical exam was performed with this format.  Please refer to the patient's chart for his consent to telehealth for [facility].   Date:  10/09/2018   ID:  [name] [name], DOB [birth year], MRN [id number]  Patient Location: Home Provider Location: Office  PCP:  Cassandria Anger, MD  Cardiologist:   Johnsie Cancel Electrophysiologist:  None   Evaluation Performed:  Follow-Up Visit  Chief Complaint:  CAD  History of Present Illness:    78 y.o. with CAD. Known CTO RCA, Previous cutting balloon D1 latter occluded in 2008. Last cath 2011 with 70% mid LAD Rx medically by Dr Olevia Perches.  Last myovue 11/02/16  no ischemia EF 64% low risk. 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   No angina or claudication. Son who is a priest getting an assignment in [place] with cloistered nuns  The patient  does not have symptoms concerning for COVID-19 infection (fever, chills, cough, or new shortness of breath).    Past Medical History:  Diagnosis Date  . Allergic rhinitis   . Allergy   . Arthritis    in neck  . CAD (coronary artery disease)   . Cataract    Had surg 08/2016  . CERUMEN IMPACTION 06/04/2009   5/15    . Colon polyp   . Diverticulosis   . Family hx of colon cancer    brother  . Hyperlipemia   . Hypertension   . Hypothyroidism   . MI (myocardial infarction) ([place]) 1993/2007   no stents  . OSA  (obstructive sleep apnea)   . Psoriasis   . Sleep apnea    no cpap- had sleep study per pt no OSA   Past Surgical History:  Procedure Laterality Date  . CARDIAC CATHETERIZATION  1993  . COLONOSCOPY    . POLYPECTOMY    . PTCA    . TONSILLECTOMY          Allergies:   Patient has no known allergies.   Social History   Tobacco Use  . Smoking status: Former Smoker    Quit date: 04/1978    Years since quitting: 40.4  . Smokeless tobacco: Never Used  Substance Use Topics  . Alcohol use: Yes    Alcohol/week: 6.0 standard drinks    Types: 6 Cans of beer per week  . Drug use: No     Family Hx: The patient's family history includes Bone cancer in his brother; COPD in his father; Cancer in his brother; Colon cancer in his brother, maternal aunt, maternal aunt, maternal uncle, paternal aunt, paternal grandfather, and unknown relative; Colon polyps in his sister; Coronary artery disease in his mother; Heart disease in his mother; Hypertension in his unknown relative. There is no history of Esophageal cancer, Rectal cancer, or Stomach cancer.  ROS:   Please see the history of present illness.     All other systems reviewed and are negative.  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   Recent Labs: 12/01/2017: ALT 25; BUN 15; Creatinine, Ser 0.99; Hemoglobin 13.0; Platelets 214.0; Potassium 4.1; Sodium 141 01/10/2018: TSH 4.30   Recent Lipid Panel Lab Results  Component Value Date/Time   CHOL 128 12/01/2017 07:33 AM   TRIG 152.0 (H) 12/01/2017 07:33 AM   TRIG 52 03/21/2006 08:20 AM   HDL 38.40 (L) 12/01/2017 07:33 AM   CHOLHDL 3 12/01/2017 07:33 AM   LDLCALC 60 12/01/2017 07:33 AM    Wt Readings from Last 3 Encounters:  10/09/18 91.6 kg  03/31/18 91.7 kg  11/30/17 98 kg     Objective:    Vital Signs:  BP (!) 154/72   Pulse (!) 53   Ht 6\' 1"   (1.854 m)   Wt 91.6 kg   BMI 26.65 kg/m    Skin warm and dry No distress No tachypnea No JVP elevation  Neuro appears non focal No edema  Telephone visit no exam   ASSESSMENT & PLAN:    CAD: Occluded D1 and RCA with moderate LAD disease non ischemic myovue 05/03/16 Continue medical RX  Very active with no angina    HTN: Well controlled.  Continue current medications and low sodium Dash type diet.    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  The importance of social distancing was discussed today.  Time:   Today, I have spent 30 minutes with the patient with telehealth technology discussing the above problems.     Medication Adjustments/Labs and Tests Ordered: Current medicines are reviewed at length with the patient today.  Concerns regarding medicines are outlined above.   Tests Ordered:  Ex Myovue in a year   Medication Changes:  None   Disposition:  Follow up in 6 months  Signed, Jenkins Rouge, MD  10/09/2018 8:23 AM    [place]

## 2018-10-03 NOTE — Telephone Encounter (Signed)
Left message for patient to call back. Patient will need to consent to virtual visit or reschedule past August for in person visit.

## 2018-10-06 ENCOUNTER — Other Ambulatory Visit: Payer: Self-pay | Admitting: Internal Medicine

## 2018-10-09 ENCOUNTER — Encounter: Payer: Self-pay | Admitting: Cardiovascular Disease

## 2018-10-09 ENCOUNTER — Other Ambulatory Visit: Payer: Self-pay

## 2018-10-09 ENCOUNTER — Telehealth (INDEPENDENT_AMBULATORY_CARE_PROVIDER_SITE_OTHER): Payer: Medicare Other | Admitting: Cardiovascular Disease

## 2018-10-09 VITALS — BP 154/72 | HR 53 | Ht 73.0 in | Wt 202.0 lb

## 2018-10-09 DIAGNOSIS — I2510 Atherosclerotic heart disease of native coronary artery without angina pectoris: Secondary | ICD-10-CM

## 2018-10-09 NOTE — Patient Instructions (Addendum)
Medication Instructions:   If you need a refill on your cardiac medications before your next appointment, please call your pharmacy.   Lab work:  If you have labs (blood work) drawn today and your tests are completely normal, you will receive your results only by: [name] MyChart Message (if you have MyChart) OR . A paper copy in the mail If you have any lab test that is abnormal or we need to change your treatment, we will call you to review the results.  Testing/Procedures: Your physician has requested that you have en exercise stress myoview in one year. For further information please visit [email or web address]. Please follow instruction sheet, as given. Someone will call you to make this appointment.  Follow-Up: At [facility], you and your health needs are our priority.  As part of our continuing mission to provide you with exceptional heart care, we have created designated Provider Care Teams.  These Care Teams include your primary Cardiologist (physician) and Advanced Practice Providers (APPs -  Physician Assistants and Nurse Practitioners) who all work together to provide you with the care you need, when you need it. You will need a follow up appointment in 12 months.  Please call our office 2 months in advance to schedule this appointment.  You may see Jenkins Rouge, MD or one of the following Advanced Practice Providers on your designated Care Team:   Truitt Merle, NP Cecilie Kicks, NP . Kathyrn Drown, NP

## 2018-11-24 ENCOUNTER — Encounter: Payer: Self-pay | Admitting: *Deleted

## 2018-11-28 ENCOUNTER — Other Ambulatory Visit: Payer: Self-pay | Admitting: Internal Medicine

## 2018-12-01 ENCOUNTER — Other Ambulatory Visit: Payer: Self-pay | Admitting: Internal Medicine

## 2018-12-13 DIAGNOSIS — H353111 Nonexudative age-related macular degeneration, right eye, early dry stage: Secondary | ICD-10-CM | POA: Diagnosis not present

## 2018-12-13 DIAGNOSIS — H02831 Dermatochalasis of right upper eyelid: Secondary | ICD-10-CM | POA: Diagnosis not present

## 2018-12-13 DIAGNOSIS — D3132 Benign neoplasm of left choroid: Secondary | ICD-10-CM | POA: Diagnosis not present

## 2018-12-13 DIAGNOSIS — Z961 Presence of intraocular lens: Secondary | ICD-10-CM | POA: Diagnosis not present

## 2018-12-29 ENCOUNTER — Other Ambulatory Visit: Payer: Self-pay | Admitting: Internal Medicine

## 2019-01-24 ENCOUNTER — Other Ambulatory Visit: Payer: Self-pay | Admitting: Internal Medicine

## 2019-01-30 ENCOUNTER — Encounter: Payer: Self-pay | Admitting: Internal Medicine

## 2019-02-24 ENCOUNTER — Other Ambulatory Visit: Payer: Self-pay | Admitting: Internal Medicine

## 2019-03-01 ENCOUNTER — Ambulatory Visit (INDEPENDENT_AMBULATORY_CARE_PROVIDER_SITE_OTHER): Payer: Medicare Other | Admitting: Internal Medicine

## 2019-03-01 ENCOUNTER — Other Ambulatory Visit: Payer: Self-pay

## 2019-03-01 ENCOUNTER — Telehealth: Payer: Self-pay | Admitting: *Deleted

## 2019-03-01 ENCOUNTER — Encounter: Payer: Self-pay | Admitting: Internal Medicine

## 2019-03-01 VITALS — BP 122/64 | HR 55 | Temp 98.2°F | Ht 73.0 in | Wt 211.0 lb

## 2019-03-01 VITALS — BP 124/74 | HR 86 | Temp 98.1°F | Ht 73.0 in | Wt 210.0 lb

## 2019-03-01 DIAGNOSIS — M5440 Lumbago with sciatica, unspecified side: Secondary | ICD-10-CM

## 2019-03-01 DIAGNOSIS — E785 Hyperlipidemia, unspecified: Secondary | ICD-10-CM

## 2019-03-01 DIAGNOSIS — Z23 Encounter for immunization: Secondary | ICD-10-CM

## 2019-03-01 DIAGNOSIS — Z1159 Encounter for screening for other viral diseases: Secondary | ICD-10-CM | POA: Diagnosis not present

## 2019-03-01 DIAGNOSIS — K219 Gastro-esophageal reflux disease without esophagitis: Secondary | ICD-10-CM

## 2019-03-01 DIAGNOSIS — N320 Bladder-neck obstruction: Secondary | ICD-10-CM

## 2019-03-01 DIAGNOSIS — I2510 Atherosclerotic heart disease of native coronary artery without angina pectoris: Secondary | ICD-10-CM

## 2019-03-01 DIAGNOSIS — Z86010 Personal history of colonic polyps: Secondary | ICD-10-CM

## 2019-03-01 DIAGNOSIS — K222 Esophageal obstruction: Secondary | ICD-10-CM

## 2019-03-01 DIAGNOSIS — Z7902 Long term (current) use of antithrombotics/antiplatelets: Secondary | ICD-10-CM | POA: Diagnosis not present

## 2019-03-01 DIAGNOSIS — R0989 Other specified symptoms and signs involving the circulatory and respiratory systems: Secondary | ICD-10-CM

## 2019-03-01 DIAGNOSIS — M542 Cervicalgia: Secondary | ICD-10-CM

## 2019-03-01 DIAGNOSIS — E034 Atrophy of thyroid (acquired): Secondary | ICD-10-CM | POA: Diagnosis not present

## 2019-03-01 MED ORDER — MELOXICAM 7.5 MG PO TABS: 7.5000 mg | ORAL_TABLET | Freq: Every day | ORAL | 2 refills | Status: DC | PRN

## 2019-03-01 MED ORDER — SUPREP BOWEL PREP KIT 17.5-3.13-1.6 GM/177ML PO SOLN: 1.0000 | ORAL | 0 refills | Status: DC

## 2019-03-01 MED ORDER — PANTOPRAZOLE SODIUM 40 MG PO TBEC: 40.0000 mg | DELAYED_RELEASE_TABLET | Freq: Every day | ORAL | 3 refills | Status: DC

## 2019-03-01 NOTE — Telephone Encounter (Signed)
   Primary Cardiologist: Jenkins Rouge, MD  Chart reviewed as part of pre-operative protocol coverage. Given past medical history and time since last visit, based on ACC/AHA guidelines, JATNIEL KOKER would be at acceptable risk for the planned procedure without further cardiovascular testing.   Per Dr. Johnsie Cancel, it will be acceptable to hold Plavix 5 days prior to surgery and resume when ok per procedural team.   I will route this recommendation to the requesting party via [facility] fax function and remove from pre-op pool.  Please call with questions.  Kathyrn Drown, NP 03/01/2019, 2:22 PM

## 2019-03-01 NOTE — Patient Instructions (Signed)
You have been scheduled for a colonoscopy. Please follow written instructions given to you at your visit today.  Please pick up your prep supplies at the pharmacy within the next 1-3 days. If you use inhalers (even only as needed), please bring them with you on the day of your procedure. Your physician has requested that you go to www.startemmi.com and enter the access code given to you at your visit today. This web site gives a general overview about your procedure. However, you should still follow specific instructions given to you by our office regarding your preparation for the procedure.  We have sent the following medications to your pharmacy for you to pick up at your convenience: Pantoprazole 40 mg daily  You will be contacted by our office prior to your procedure for directions on holding your Plavix.  If you do not hear from our office 1 week prior to your scheduled procedure, please call [phone] to discuss.   If you are age 71 or older, your body mass index should be between 23-30. Your Body mass index is 27.71 kg/m. If this is out of the aforementioned range listed, please consider follow up with your Primary Care Provider.  If you are age 82 or younger, your body mass index should be between 19-25. Your Body mass index is 27.71 kg/m. If this is out of the aformentioned range listed, please consider follow up with your Primary Care Provider.

## 2019-03-01 NOTE — Telephone Encounter (Signed)
Request for surgical clearance:     Endoscopy Procedure  What type of surgery is being performed?     colonoscopy  When is this surgery scheduled?     04/05/2019  What type of clearance is required ?   Pharmacy  Are there any medications that need to be held prior to surgery and how long? Plavix, 5 days  Practice name and name of physician performing surgery?      [facility]  What is your office phone and fax number?      Phone- [phone]  [phone]  Anesthesia type (None, local, MAC, general) ?       MAC

## 2019-03-01 NOTE — Progress Notes (Signed)
   Subjective:    Patient ID: [name] [name], male    DOB: Dec 01, 1940, 78 y.o.   MRN: [id number]  HPI [name] [name] is a 78 year old male with a personal history of adenomatous and sessile serrated colon polyps, family history of colon cancer, GERD with history of dysphagia due to hiatal hernia with Schatzki's ring, CAD on Plavix, hypertension, hyperlipidemia and hypothyroidism who is here for follow-up.  He is here alone today.  He reports he has been doing well.  His reflux has been well controlled on pantoprazole 40 mg a day.  His heartburn is well controlled.  We performed endoscopy last in April 2019 where we dilated his Schatzki's ring to 15.5 mm with balloon.  He reports this definitively helped his dysphagia though he continues to be careful with his diet.  He chews his food very well.  He has not had recurrent trouble swallowing.  No abdominal pain.  Bowel movements remain regular without blood in his stool or melena.  His last colonoscopy was performed 3 years ago in October 2017 where 4 polyps removed ranging from 4 to 6 mm in size.  These were combination of tubular adenoma and sessile serrated colon polyp.   Review of Systems As per HPI, otherwise negative  Current Medications, Allergies, Past Medical History, Past Surgical History, Family History and Social History were reviewed in [employer] record.     Objective:   Physical Exam BP 124/74 (BP Location: Left Arm, Patient Position: Sitting)   Pulse 86   Temp 98.1 F (36.7 C)   Ht 6\' 1"  (1.854 m)   Wt 210 lb (95.3 kg)   SpO2 96%   BMI 27.71 kg/m  Gen: awake, alert, NAD HEENT: anicteric CV: RRR, no mrg Pulm: CTA b/l Abd: soft, NT/ND, +BS throughout Ext: no c/c/e Neuro: nonfocal      Assessment & Plan:   78 year old male with a personal history of adenomatous and sessile serrated colon polyps, family history of colon cancer, GERD with history of dysphagia due to hiatal hernia with  Schatzki's ring, CAD on Plavix, hypertension, hyperlipidemia and hypothyroidism who is here for follow-up.   1.  GERD with Schatzki's ring/hiatal hernia --symptoms controlled on pantoprazole.  Dilation was effective in improving dysphagia.  No recurrent dysphagia to warrant repeat dilation.  This can be done as symptoms warrant --Continue pantoprazole 40 mg once daily --Repeat EGD for dilation on an as-needed basis  2.  History of adenomatous and sessile serrated colon polyps/family history of colon cancer--surveillance colonoscopy indicated at this time.  I do feel that this is appropriate for him and he is in agreement.  We discussed the risk, benefits and alternatives and he is agreeable and wishes to proceed.  We will need to contact cardiology regarding holding his Plavix --Surveillance colonoscopy in the [place]  Hold Plavix 5 days before procedure - will instruct when and how to resume after procedure. Risks and benefits of procedure including bleeding, perforation, infection, missed lesions, medication reactions and possible hospitalization or surgery if complications occur explained. Additional rare but real risk of cardiovascular event such as heart attack or ischemia/infarct of other organs off Plavix explained and need to seek urgent help if this occurs. Will communicate by phone or EMR with patient's prescribing provider that to confirm holding Plavix is reasonable in this case.

## 2019-03-01 NOTE — Telephone Encounter (Signed)
Dr. Johnsie Cancel   Can you please comment on holding this patients Plavix therapy 5 days prior to colonoscopy? He has a hx of CAD with a known occluded D1 and RCA with moderate LAD disease. Had a non ischemic myovue 05/03/16 with recommendations for continued medical treatment. You last saw him 10/09/18 and he was noted to be doing well with no angina.   Please send your recommendations to the pre op pool   Thank you Sharee Pimple

## 2019-03-01 NOTE — Telephone Encounter (Signed)
Ok to hold plavix for 5 days before colonoscopy 

## 2019-03-01 NOTE — Telephone Encounter (Signed)
Left voicemail for patient to call back. 

## 2019-03-01 NOTE — Progress Notes (Signed)
Subjective:  Patient ID: [name] [name], male    DOB: 1940-05-09  Age: 78 y.o. MRN: [id number]  CC: No chief complaint on file.   HPI [name] [name] presents for back pain, cervical pain -recurrent, exacerbated again  Outpatient Medications Prior to Visit  Medication Sig Dispense Refill  . acetaminophen (TYLENOL) 500 MG tablet Take 500 mg by mouth every 6 (six) hours as needed.    [name] aspirin 81 MG tablet Take 81 mg by mouth daily.      [name] azelastine (OPTIVAR) 0.05 % ophthalmic solution Place 1 drop into both eyes 2 (two) times daily. 6 mL 6  . cholecalciferol (VITAMIN D) 1000 UNITS tablet Take 4,000 Units by mouth daily.     . clopidogrel (PLAVIX) 75 MG tablet TAKE 1 TABLET DAILY 90 tablet 3  . levothyroxine (SYNTHROID, LEVOTHROID) 100 MCG tablet TAKE 1 TABLET BY MOUTH EVERY DAY 90 tablet 3  . losartan (COZAAR) 100 MG tablet Take 1 tablet (100 mg total) by mouth daily. Need visit for further refills 90 tablet 0  . metoprolol succinate (TOPROL-XL) 25 MG 24 hr tablet TAKE 1 TABLET (25 MG TOTAL) BY MOUTH DAILY. **NEEDS APPOINTMENT** 90 tablet 3  . Multiple Vitamins-Minerals (CENTRUM SILVER PO) Take 1 tablet by mouth daily.      . niacin (NIASPAN) 1000 MG CR tablet TAKE 2 TABLETS BY MOUTH AT BEDTIME 180 tablet 3  . nitroGLYCERIN (NITROSTAT) 0.4 MG SL tablet Place 1 tablet (0.4 mg total) under the tongue every 5 (five) minutes as needed for chest pain (3 doses MAX). 25 tablet 4  . Omega-3 Fatty Acids (FISH OIL) 1200 MG CAPS Take 1 capsule by mouth daily.     . pantoprazole (PROTONIX) 40 MG tablet Take 1 tablet (40 mg total) by mouth daily. Take 30 minutes before breakfast. 90 tablet 3  . simvastatin (ZOCOR) 40 MG tablet TAKE 1 TABLET (40 MG TOTAL) BY MOUTH DAILY. **NEEDS APPOINTMENT** 90 tablet 3  . SUPREP BOWEL PREP KIT 17.5-3.13-1.6 GM/177ML SOLN Take 1 kit by mouth as directed. For colonoscopy prep 354 mL 0  . amoxicillin (AMOXIL) 500 MG capsule Take 1 capsule by mouth 3 (three) times  daily as needed.     No facility-administered medications prior to visit.     ROS: Review of Systems  Constitutional: Negative for appetite change, fatigue and unexpected weight change.  HENT: Negative for congestion, nosebleeds, sneezing, sore throat and trouble swallowing.   Eyes: Negative for itching and visual disturbance.  Respiratory: Negative for cough.   Cardiovascular: Negative for chest pain, palpitations and leg swelling.  Gastrointestinal: Negative for abdominal distention, blood in stool, diarrhea and nausea.  Genitourinary: Negative for frequency and hematuria.  Musculoskeletal: Positive for back pain, neck pain and neck stiffness. Negative for gait problem and joint swelling.  Skin: Negative for rash.  Neurological: Negative for dizziness, tremors, speech difficulty and weakness.  Psychiatric/Behavioral: Negative for agitation, dysphoric mood, sleep disturbance and suicidal ideas. The patient is not nervous/anxious.     Objective:  BP 122/64 (BP Location: Left Arm, Patient Position: Sitting, Cuff Size: Large)   Pulse (!) 55   Temp 98.2 F (36.8 C) (Oral)   Ht '6\' 1"'  (1.854 m)   Wt 211 lb (95.7 kg)   SpO2 97%   BMI 27.84 kg/m   BP Readings from Last 3 Encounters:  03/01/19 122/64  03/01/19 124/74  10/09/18 (!) 154/72    Wt Readings from Last 3 Encounters:  03/01/19 211 lb (95.7  kg)  03/01/19 210 lb (95.3 kg)  10/09/18 202 lb (91.6 kg)    Physical Exam Constitutional:      General: He is not in acute distress.    Appearance: He is well-developed.     Comments: NAD  Eyes:     Conjunctiva/sclera: Conjunctivae normal.     Pupils: Pupils are equal, round, and reactive to light.  Neck:     Musculoskeletal: Normal range of motion.     Thyroid: No thyromegaly.     Vascular: No JVD.  Cardiovascular:     Rate and Rhythm: Normal rate and regular rhythm.     Heart sounds: Normal heart sounds. No murmur. No friction rub. No gallop.   Pulmonary:     Effort:  Pulmonary effort is normal. No respiratory distress.     Breath sounds: Normal breath sounds. No wheezing or rales.  Chest:     Chest wall: No tenderness.  Abdominal:     General: Bowel sounds are normal. There is no distension.     Palpations: Abdomen is soft. There is no mass.     Tenderness: There is no abdominal tenderness. There is no guarding or rebound.  Musculoskeletal: Normal range of motion.        General: No tenderness.  Lymphadenopathy:     Cervical: No cervical adenopathy.  Skin:    General: Skin is warm and dry.     Findings: No rash.  Neurological:     Mental Status: He is alert and oriented to person, place, and time.     Cranial Nerves: No cranial nerve deficit.     Motor: No abnormal muscle tone.     Coordination: Coordination normal.     Gait: Gait abnormal.     Deep Tendon Reflexes: Reflexes are normal and symmetric.  Psychiatric:        Behavior: Behavior normal.        Thought Content: Thought content normal.        Judgment: Judgment normal.     Lab Results  Component Value Date   WBC 6.8 12/01/2017   HGB 13.0 12/01/2017   HCT 38.3 (L) 12/01/2017   PLT 214.0 12/01/2017   GLUCOSE 96 12/01/2017   CHOL 128 12/01/2017   TRIG 152.0 (H) 12/01/2017   HDL 38.40 (L) 12/01/2017   LDLCALC 60 12/01/2017   ALT 25 12/01/2017   AST 25 12/01/2017   NA 141 12/01/2017   K 4.1 12/01/2017   CL 104 12/01/2017   CREATININE 0.99 12/01/2017   BUN 15 12/01/2017   CO2 28 12/01/2017   TSH 4.30 01/10/2018   PSA 0.68 12/01/2017   INR 1.0 ratio 03/06/2010   HGBA1C 5.8 08/28/2013    Dg Cervical Spine Complete  Result Date: 05/05/2017 CLINICAL DATA:  RIGHT-sided neck pain for 1 week worse when turning head to the RIGHT EXAM: CERVICAL SPINE - COMPLETE 4+ VIEW COMPARISON:  05/29/2014 FINDINGS: Prevertebral soft tissues normal thickness. Bones appear slightly demineralized. Anterior endplate spur formation identified and inferior C2, inferior C3, and at the C5-C6 and  C6-C7 endplates. Vertebral body heights maintained without fracture or subluxation. Encroachment upon RIGHT C3-C4 and C4-C5 neural foramina by combination of facet and uncovertebral spurs. Mild scattered facet degenerative changes. Lung apices clear. 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 No new osseous abnormalities. Electronically Signed   By:  Lavonia Dana M.D.   On: 05/05/2017 16:52    Assessment & Plan:   There are no diagnoses linked to this encounter.   No orders of the defined types were placed in this encounter.    Follow-up: No follow-ups on file.  Walker Kehr, MD

## 2019-03-01 NOTE — Telephone Encounter (Signed)
Patient came by the office after seeing I called him. I have explained to him that per Dr Johnsie Cancel, he may hold Plavix 5 days prior to upcoming procedure and restart as per Dr Vena Rua recommendation at time of procedure. Patient verbalizes understanding.

## 2019-03-02 ENCOUNTER — Other Ambulatory Visit (INDEPENDENT_AMBULATORY_CARE_PROVIDER_SITE_OTHER): Payer: Medicare Other

## 2019-03-02 DIAGNOSIS — E034 Atrophy of thyroid (acquired): Secondary | ICD-10-CM

## 2019-03-02 DIAGNOSIS — R0989 Other specified symptoms and signs involving the circulatory and respiratory systems: Secondary | ICD-10-CM

## 2019-03-02 DIAGNOSIS — N320 Bladder-neck obstruction: Secondary | ICD-10-CM

## 2019-03-02 LAB — CBC WITH DIFFERENTIAL/PLATELET
Basophils Absolute: 0 10*3/uL (ref 0.0–0.1)
Basophils Relative: 0.5 % (ref 0.0–3.0)
Eosinophils Absolute: 0.3 10*3/uL (ref 0.0–0.7)
Eosinophils Relative: 3.5 % (ref 0.0–5.0)
HCT: 39.9 % (ref 39.0–52.0)
Hemoglobin: 13.1 g/dL (ref 13.0–17.0)
Lymphocytes Relative: 24 % (ref 12.0–46.0)
Lymphs Abs: 2.1 10*3/uL (ref 0.7–4.0)
MCHC: 32.8 g/dL (ref 30.0–36.0)
MCV: 89.3 fl (ref 78.0–100.0)
Monocytes Absolute: 0.9 10*3/uL (ref 0.1–1.0)
Monocytes Relative: 10.1 % (ref 3.0–12.0)
Neutro Abs: 5.4 10*3/uL (ref 1.4–7.7)
Neutrophils Relative %: 61.9 % (ref 43.0–77.0)
Platelets: 216 10*3/uL (ref 150.0–400.0)
RBC: 4.47 Mil/uL (ref 4.22–5.81)
RDW: 14.4 % (ref 11.5–15.5)
WBC: 8.7 10*3/uL (ref 4.0–10.5)

## 2019-03-02 LAB — LIPID PANEL
Cholesterol: 129 mg/dL (ref 0–200)
HDL: 52.2 mg/dL (ref >39.00)
LDL Cholesterol: 56 mg/dL (ref 0–99)
NonHDL: 76.6
Total CHOL/HDL Ratio: 2
Triglycerides: 105 mg/dL (ref 0.0–149.0)
VLDL: 21 mg/dL (ref 0.0–40.0)

## 2019-03-02 LAB — URINALYSIS
Bilirubin Urine: NEGATIVE
Hgb urine dipstick: NEGATIVE
Ketones, ur: NEGATIVE
Leukocytes,Ua: NEGATIVE
Nitrite: NEGATIVE
Specific Gravity, Urine: 1.01 (ref 1.000–1.030)
Total Protein, Urine: NEGATIVE
Urine Glucose: NEGATIVE
Urobilinogen, UA: 0.2 (ref 0.0–1.0)
pH: 6.5 (ref 5.0–8.0)

## 2019-03-02 LAB — PSA: PSA: 0.66 ng/mL (ref 0.10–4.00)

## 2019-03-02 LAB — HEPATIC FUNCTION PANEL
ALT: 24 U/L (ref 0–53)
AST: 24 U/L (ref 0–37)
Albumin: 4.4 g/dL (ref 3.5–5.2)
Alkaline Phosphatase: 39 U/L (ref 39–117)
Bilirubin, Direct: 0.2 mg/dL (ref 0.0–0.3)
Total Bilirubin: 0.9 mg/dL (ref 0.2–1.2)
Total Protein: 6.7 g/dL (ref 6.0–8.3)

## 2019-03-02 LAB — BASIC METABOLIC PANEL
BUN: 13 mg/dL (ref 6–23)
CO2: 27 mEq/L (ref 19–32)
Calcium: 9.4 mg/dL (ref 8.4–10.5)
Chloride: 103 mEq/L (ref 96–112)
Creatinine, Ser: 0.98 mg/dL (ref 0.40–1.50)
GFR: 73.99 mL/min (ref >60.00)
Glucose, Bld: 90 mg/dL (ref 70–99)
Potassium: 4.1 mEq/L (ref 3.5–5.1)
Sodium: 140 mEq/L (ref 135–145)

## 2019-03-02 LAB — TSH: TSH: 3.45 u[IU]/mL (ref 0.35–4.50)

## 2019-03-04 DIAGNOSIS — R0989 Other specified symptoms and signs involving the circulatory and respiratory systems: Secondary | ICD-10-CM | POA: Insufficient documentation

## 2019-03-04 NOTE — Assessment & Plan Note (Signed)
Meloxicam as needed 

## 2019-03-04 NOTE — Assessment & Plan Note (Signed)
On simvastatin.   

## 2019-03-04 NOTE — Assessment & Plan Note (Signed)
Mild bilateral.  Obtain carotid Doppler ultrasound 

## 2019-03-07 NOTE — Addendum Note (Signed)
Addended by: Karren Cobble on: 03/07/2019 04:47 PM   Modules accepted: Orders

## 2019-03-15 ENCOUNTER — Other Ambulatory Visit: Payer: Self-pay | Admitting: Internal Medicine

## 2019-03-15 ENCOUNTER — Ambulatory Visit (HOSPITAL_COMMUNITY)
Admission: RE | Admit: 2019-03-15 | Discharge: 2019-03-15 | Disposition: A | Discharge status: Home / Self Care | Payer: Medicare Other | Source: Ambulatory Visit | Attending: Cardiology | Admitting: Cardiology

## 2019-03-15 ENCOUNTER — Other Ambulatory Visit: Payer: Self-pay

## 2019-03-15 DIAGNOSIS — R0989 Other specified symptoms and signs involving the circulatory and respiratory systems: Secondary | ICD-10-CM | POA: Insufficient documentation

## 2019-03-31 ENCOUNTER — Other Ambulatory Visit: Payer: Self-pay | Admitting: Cardiovascular Disease

## 2019-04-02 ENCOUNTER — Ambulatory Visit (INDEPENDENT_AMBULATORY_CARE_PROVIDER_SITE_OTHER): Payer: Medicare Other

## 2019-04-02 ENCOUNTER — Other Ambulatory Visit: Payer: Self-pay | Admitting: Internal Medicine

## 2019-04-02 DIAGNOSIS — Z1159 Encounter for screening for other viral diseases: Secondary | ICD-10-CM

## 2019-04-03 LAB — SARS CORONAVIRUS 2 (TAT 6-24 HRS): SARS Coronavirus 2: NEGATIVE

## 2019-04-05 ENCOUNTER — Other Ambulatory Visit: Payer: Self-pay

## 2019-04-05 ENCOUNTER — Ambulatory Visit (AMBULATORY_SURGERY_CENTER): Payer: Medicare Other | Admitting: Internal Medicine

## 2019-04-05 ENCOUNTER — Encounter: Payer: Self-pay | Admitting: Internal Medicine

## 2019-04-05 VITALS — BP 141/60 | HR 56 | Temp 97.7°F | Resp 16 | Ht 73.0 in | Wt 210.0 lb

## 2019-04-05 DIAGNOSIS — Z86010 Personal history of colonic polyps: Secondary | ICD-10-CM

## 2019-04-05 DIAGNOSIS — D125 Benign neoplasm of sigmoid colon: Secondary | ICD-10-CM

## 2019-04-05 MED ORDER — SODIUM CHLORIDE 0.9 % IV SOLN: 500.0000 mL | Freq: Once | INTRAVENOUS | Status: DC

## 2019-04-05 NOTE — Patient Instructions (Signed)
Polyp removed today. Diverticulosis and hemorrhoids noted. Please read report. Resume PLAVIX today at prior dose. Repeat colonoscopy not recommended due to your age unless new symptoms arise.     YOU HAD AN ENDOSCOPIC PROCEDURE TODAY AT [facility]:   Refer to the procedure report that was given to you for any specific questions about what was found during the examination.  If the procedure report does not answer your questions, please call your gastroenterologist to clarify.  If you requested that your care partner not be given the details of your procedure findings, then the procedure report has been included in a sealed envelope for you to review at your convenience later.  YOU SHOULD EXPECT: Some feelings of bloating in the abdomen. Passage of more gas than usual.  Walking can help get rid of the air that was put into your GI tract during the procedure and reduce the bloating. If you had a lower endoscopy (such as a colonoscopy or flexible sigmoidoscopy) you may notice spotting of blood in your stool or on the toilet paper. If you underwent a bowel prep for your procedure, you may not have a normal bowel movement for a few days.  Please Note:  You might notice some irritation and congestion in your nose or some drainage.  This is from the oxygen used during your procedure.  There is no need for concern and it should clear up in a day or so.  SYMPTOMS TO REPORT IMMEDIATELY:   Following lower endoscopy (colonoscopy or flexible sigmoidoscopy):  Excessive amounts of blood in the stool  Significant tenderness or worsening of abdominal pains  Swelling of the abdomen that is new, acute  Fever of 100F or higher   For urgent or emergent issues, a gastroenterologist can be reached at any hour by calling [phone].   DIET:  We do recommend a small meal at first, but then you may proceed to your regular diet.  Drink plenty of fluids but you should avoid alcoholic beverages  for 24 hours.  ACTIVITY:  You should plan to take it easy for the rest of today and you should NOT DRIVE or use heavy machinery until tomorrow (because of the sedation medicines used during the test).    FOLLOW UP: Our staff will call the number listed on your records 48-72 hours following your procedure to check on you and address any questions or concerns that you may have regarding the information given to you following your procedure. If we do not reach you, we will leave a message.  We will attempt to reach you two times.  During this call, we will ask if you have developed any symptoms of COVID 19. If you develop any symptoms (ie: fever, flu-like symptoms, shortness of breath, cough etc.) before then, please call [phone].  If you test positive for Covid 19 in the 2 weeks post procedure, please call and report this information to [place].    If any biopsies were taken you will be contacted by phone or by letter within the next 1-3 weeks.  Please call us at [phone] if you have not heard about the biopsies in 3 weeks.    SIGNATURES/CONFIDENTIALITY: You and/or your care partner have signed paperwork which will be entered into your electronic medical record.  These signatures attest to the fact that that the information above on your After Visit Summary has been reviewed and is understood.  Full responsibility of the confidentiality of this discharge information lies  with you and/or your care-partner.

## 2019-04-05 NOTE — Progress Notes (Signed)
Temp taken by JB VS taken by DT 

## 2019-04-05 NOTE — Progress Notes (Signed)
PT taken to PACU. Monitors in place. VSS. Report given to RN. 

## 2019-04-05 NOTE — Op Note (Signed)
[facility] Patient Name: [name] [name] Procedure Date: 04/05/2019 8:58 AM MRN: [id number] Endoscopist: Jerene Bears , MD Age: 78 Referring MD:  Date of Birth: Oct 27, 1940 Gender: Male Account #: [id number] Procedure:                Colonoscopy Indications:              High risk colon cancer surveillance: Personal                            history of non-advanced adenoma, High risk colon                            cancer surveillance: Personal history of sessile                            serrated colon polyp (less than 10 mm in size) with                            no dysplasia, Last colonoscopy: October 2017 Medicines:                Monitored Anesthesia Care Procedure:                Pre-Anesthesia Assessment:                           - Prior to the procedure, a History and Physical                            was performed, and patient medications and                            allergies were reviewed. The patient's tolerance of                            previous anesthesia was also reviewed. The risks                            and benefits of the procedure and the sedation                            options and risks were discussed with the patient.                            All questions were answered, and informed consent                            was obtained. Prior Anticoagulants: The patient has                            taken Plavix (clopidogrel), last dose was 5 days                            prior to procedure. ASA Grade Assessment: III - A  patient with severe systemic disease. After                            reviewing the risks and benefits, the patient was                            deemed in satisfactory condition to undergo the                            procedure.                           After obtaining informed consent, the colonoscope                            was passed under direct vision. Throughout the                      procedure, the patient's blood pressure, pulse, and                            oxygen saturations were monitored continuously. The                            Colonoscope was introduced through the anus and                            advanced to the cecum, identified by appendiceal                            orifice and ileocecal valve. The colonoscopy was                            performed without difficulty. The patient tolerated                            the procedure well. The quality of the bowel                            preparation was good. The ileocecal valve,                            appendiceal orifice, and rectum were photographed. Scope In: 9:05:23 AM Scope Out: 9:25:30 AM Scope Withdrawal Time: 0 hours 9 minutes 28 seconds  Total Procedure Duration: 0 hours 20 minutes 7 seconds  Findings:                 The digital rectal exam was normal.                           A 5 mm polyp was found in the sigmoid colon. The                            polyp was sessile. The polyp was removed with a  cold snare. Resection and retrieval were complete.                           A few small-mouthed diverticula were found in the                            sigmoid colon.                           Internal hemorrhoids were found during                            retroflexion. The hemorrhoids were small. Complications:            No immediate complications. Estimated Blood Loss:     Estimated blood loss was minimal. Impression:               - One 5 mm polyp in the sigmoid colon, removed with                            a cold snare. Resected and retrieved.                           - Diverticulosis in the sigmoid colon.                           - Small internal hemorrhoids. Recommendation:           - Patient has a contact number available for                            emergencies. The signs and symptoms of potential                             delayed complications were discussed with the                            patient. Return to normal activities tomorrow.                            Written discharge instructions were provided to the                            patient.                           - Resume previous diet.                           - Continue present medications.                           - Await pathology results.                           - No recommendation at this time regarding repeat  colonoscopy due to age at next screening interval                            (> 80 years).                           - Continue present medications.                           - Resume Plavix (clopidogrel) at prior dose today.                            Refer to managing physician for further adjustment                            of therapy. Jerene Bears, MD 04/05/2019 9:28:12 AM This report has been signed electronically.

## 2019-04-05 NOTE — Progress Notes (Signed)
Called to room to assist during endoscopic procedure.  Patient ID and intended procedure confirmed with present staff. Received instructions for my participation in the procedure from the performing physician.  

## 2019-04-09 ENCOUNTER — Encounter: Payer: Self-pay | Admitting: Internal Medicine

## 2019-04-09 ENCOUNTER — Telehealth: Payer: Self-pay | Admitting: *Deleted

## 2019-04-09 NOTE — Telephone Encounter (Signed)
  Follow up Call-  Call back number 04/05/2019 08/10/2017 05/20/2017  Post procedure Call Back phone  # [phone]  Permission to leave phone message Yes Yes Yes  Some recent data might be hidden     Patient questions:  Do you have a fever, pain , or abdominal swelling? No. Pain Score  0 *  Have you tolerated food without any problems? Yes.    Have you been able to return to your normal activities? Yes.    Do you have any questions about your discharge instructions: Diet   No. Medications  No. Follow up visit  No.  Do you have questions or concerns about your Care? No.  Actions: * If pain score is 4 or above: No action needed, pain <4.  1. Have you developed a fever since your procedure? no  2.   Have you had an respiratory symptoms (SOB or cough) since your procedure? no  3.   Have you tested positive for COVID 19 since your procedure no  4.   Have you had any family members/close contacts diagnosed with the COVID 19 since your procedure?  no   If yes to any of these questions please route to Joylene John, RN and Alphonsa Gin, Therapist, sports.

## 2019-05-07 ENCOUNTER — Ambulatory Visit: Payer: Medicare Other | Attending: Internal Medicine

## 2019-05-07 DIAGNOSIS — Z23 Encounter for immunization: Secondary | ICD-10-CM | POA: Insufficient documentation

## 2019-05-07 NOTE — Progress Notes (Signed)
   Covid-19 Vaccination Clinic  Name:  [name] [name]    MRN: [id number] DOB: 1940-11-22  05/07/2019  [name] was observed post Covid-19 immunization for 30 minutes based on pre-vaccination screening without incidence. He was provided with Vaccine Information Sheet and instruction to access the V-Safe system.   [name] was instructed to call 911 with any severe reactions post vaccine: [name] Difficulty breathing  . Swelling of your face and throat  . A fast heartbeat  . A bad rash all over your body  . Dizziness and weakness    Immunizations Administered    Name Date Dose VIS Date Route   Pfizer COVID-19 Vaccine 05/07/2019  9:04 AM 0.3 mL 04/06/2019 Intramuscular   Manufacturer: [employer], [employer]   Lot: [id number]   [place]: [id number]

## 2019-05-17 ENCOUNTER — Other Ambulatory Visit: Payer: Self-pay | Admitting: Cardiovascular Disease

## 2019-05-25 ENCOUNTER — Other Ambulatory Visit: Payer: Self-pay | Admitting: Internal Medicine

## 2019-05-27 ENCOUNTER — Ambulatory Visit: Payer: Medicare Other | Attending: Internal Medicine

## 2019-05-27 DIAGNOSIS — Z23 Encounter for immunization: Secondary | ICD-10-CM | POA: Insufficient documentation

## 2019-05-27 NOTE — Progress Notes (Signed)
   Covid-19 Vaccination Clinic  Name:  [name] [name]    MRN: [id number] DOB: Jun 14, 1940  05/27/2019  [name] was observed post Covid-19 immunization for 15 minutes without incidence. He was provided with Vaccine Information Sheet and instruction to access the V-Safe system.   [name] was instructed to call 911 with any severe reactions post vaccine: [name] Difficulty breathing  . Swelling of your face and throat  . A fast heartbeat  . A bad rash all over your body  . Dizziness and weakness    Immunizations Administered    Name Date Dose VIS Date Route   Pfizer COVID-19 Vaccine 05/27/2019  9:30 AM 0.3 mL 04/06/2019 Intramuscular   Manufacturer: [place]   Lot: [id number]   [place]: [id number]

## 2019-08-22 ENCOUNTER — Other Ambulatory Visit: Payer: Self-pay | Admitting: Internal Medicine

## 2019-11-12 ENCOUNTER — Other Ambulatory Visit: Payer: Self-pay | Admitting: Cardiovascular Disease

## 2019-11-27 ENCOUNTER — Other Ambulatory Visit: Payer: Self-pay | Admitting: Internal Medicine

## 2019-12-05 ENCOUNTER — Telehealth (HOSPITAL_COMMUNITY): Payer: Self-pay | Admitting: *Deleted

## 2019-12-05 NOTE — Telephone Encounter (Signed)
Patient given detailed instructions per Myocardial Perfusion Study Information Sheet for the test on 12/11/19. Patient notified to arrive 15 minutes early and that it is imperative to arrive on time for appointment to keep from having the test rescheduled. ° If you need to cancel or reschedule your appointment, please call the office within 24 hours of your appointment. . Patient verbalized understanding. Nicola Quesnell Jacqueline ° ° ° °

## 2019-12-11 ENCOUNTER — Ambulatory Visit (HOSPITAL_COMMUNITY): Payer: Medicare Other | Attending: Cardiology

## 2019-12-11 ENCOUNTER — Other Ambulatory Visit: Payer: Self-pay

## 2019-12-11 DIAGNOSIS — I2510 Atherosclerotic heart disease of native coronary artery without angina pectoris: Secondary | ICD-10-CM | POA: Insufficient documentation

## 2019-12-11 LAB — MYOCARDIAL PERFUSION IMAGING
Estimated workload: 8.6 METS
Exercise duration (min): 7 min
Exercise duration (sec): 3 s
LV dias vol: 82 mL (ref 62–150)
LV sys vol: 26 mL
MPHR: 142 {beats}/min
Peak HR: 134 {beats}/min
Percent HR: 94 %
Rest HR: 72 {beats}/min
SDS: 4
SRS: 2
SSS: 6
TID: 0.98

## 2019-12-11 MED ORDER — TECHNETIUM TC 99M TETROFOSMIN IV KIT
31.6000 | PACK | Freq: Once | INTRAVENOUS | Status: AC | PRN
  Administered 2019-12-11: 31.6 via INTRAVENOUS
  Filled 2019-12-11: qty 32

## 2019-12-11 MED ORDER — TECHNETIUM TC 99M TETROFOSMIN IV KIT
10.2000 | PACK | Freq: Once | INTRAVENOUS | Status: AC | PRN
  Administered 2019-12-11: 10.2 via INTRAVENOUS
  Filled 2019-12-11: qty 11

## 2020-01-28 ENCOUNTER — Other Ambulatory Visit: Payer: Self-pay | Admitting: Internal Medicine

## 2020-02-13 ENCOUNTER — Other Ambulatory Visit: Payer: Self-pay | Admitting: Internal Medicine

## 2020-03-27 ENCOUNTER — Other Ambulatory Visit: Payer: Self-pay

## 2020-03-27 ENCOUNTER — Ambulatory Visit (INDEPENDENT_AMBULATORY_CARE_PROVIDER_SITE_OTHER): Payer: Medicare Other

## 2020-03-27 DIAGNOSIS — Z23 Encounter for immunization: Secondary | ICD-10-CM

## 2020-03-31 ENCOUNTER — Other Ambulatory Visit: Payer: Self-pay | Admitting: Internal Medicine

## 2020-04-01 ENCOUNTER — Ambulatory Visit (INDEPENDENT_AMBULATORY_CARE_PROVIDER_SITE_OTHER): Payer: Medicare Other

## 2020-04-01 ENCOUNTER — Ambulatory Visit (INDEPENDENT_AMBULATORY_CARE_PROVIDER_SITE_OTHER): Payer: Medicare Other | Admitting: Internal Medicine

## 2020-04-01 ENCOUNTER — Encounter: Payer: Self-pay | Admitting: Internal Medicine

## 2020-04-01 ENCOUNTER — Other Ambulatory Visit: Payer: Self-pay

## 2020-04-01 VITALS — BP 134/68 | HR 59 | Temp 97.9°F | Wt 196.0 lb

## 2020-04-01 DIAGNOSIS — E785 Hyperlipidemia, unspecified: Secondary | ICD-10-CM | POA: Diagnosis not present

## 2020-04-01 DIAGNOSIS — N320 Bladder-neck obstruction: Secondary | ICD-10-CM | POA: Diagnosis not present

## 2020-04-01 DIAGNOSIS — I2510 Atherosclerotic heart disease of native coronary artery without angina pectoris: Secondary | ICD-10-CM | POA: Diagnosis not present

## 2020-04-01 DIAGNOSIS — R059 Cough, unspecified: Secondary | ICD-10-CM

## 2020-04-01 DIAGNOSIS — M5440 Lumbago with sciatica, unspecified side: Secondary | ICD-10-CM | POA: Diagnosis not present

## 2020-04-01 LAB — CBC WITH DIFFERENTIAL/PLATELET
Basophils Absolute: 0.1 10*3/uL (ref 0.0–0.1)
Basophils Relative: 0.8 % (ref 0.0–3.0)
Eosinophils Absolute: 0.2 10*3/uL (ref 0.0–0.7)
Eosinophils Relative: 2.8 % (ref 0.0–5.0)
HCT: 39.5 % (ref 39.0–52.0)
Hemoglobin: 13 g/dL (ref 13.0–17.0)
Lymphocytes Relative: 32 % (ref 12.0–46.0)
Lymphs Abs: 2.6 10*3/uL (ref 0.7–4.0)
MCHC: 32.9 g/dL (ref 30.0–36.0)
MCV: 81.2 fl (ref 78.0–100.0)
Monocytes Absolute: 0.7 10*3/uL (ref 0.1–1.0)
Monocytes Relative: 8.4 % (ref 3.0–12.0)
Neutro Abs: 4.5 10*3/uL (ref 1.4–7.7)
Neutrophils Relative %: 56 % (ref 43.0–77.0)
Platelets: 246 10*3/uL (ref 150.0–400.0)
RBC: 4.86 Mil/uL (ref 4.22–5.81)
RDW: 15.8 % — ABNORMAL HIGH (ref 11.5–15.5)
WBC: 8 10*3/uL (ref 4.0–10.5)

## 2020-04-01 LAB — URINALYSIS
Bilirubin Urine: NEGATIVE
Hgb urine dipstick: NEGATIVE
Leukocytes,Ua: NEGATIVE
Nitrite: NEGATIVE
Specific Gravity, Urine: 1.01 (ref 1.000–1.030)
Total Protein, Urine: NEGATIVE
Urine Glucose: NEGATIVE
Urobilinogen, UA: 0.2 (ref 0.0–1.0)
pH: 6.5 (ref 5.0–8.0)

## 2020-04-01 LAB — COMPREHENSIVE METABOLIC PANEL
ALT: 24 U/L (ref 0–53)
AST: 25 U/L (ref 0–37)
Albumin: 4.7 g/dL (ref 3.5–5.2)
Alkaline Phosphatase: 52 U/L (ref 39–117)
BUN: 12 mg/dL (ref 6–23)
CO2: 27 mEq/L (ref 19–32)
Calcium: 9.7 mg/dL (ref 8.4–10.5)
Chloride: 99 mEq/L (ref 96–112)
Creatinine, Ser: 1.03 mg/dL (ref 0.40–1.50)
GFR: 69.34 mL/min (ref >60.00)
Glucose, Bld: 89 mg/dL (ref 70–99)
Potassium: 3.3 mEq/L — ABNORMAL LOW (ref 3.5–5.1)
Sodium: 137 mEq/L (ref 135–145)
Total Bilirubin: 1 mg/dL (ref 0.2–1.2)
Total Protein: 7.8 g/dL (ref 6.0–8.3)

## 2020-04-01 LAB — TSH: TSH: 5.18 u[IU]/mL — ABNORMAL HIGH (ref 0.35–4.50)

## 2020-04-01 LAB — LIPID PANEL
Cholesterol: 127 mg/dL (ref 0–200)
HDL: 54.2 mg/dL (ref >39.00)
LDL Cholesterol: 62 mg/dL (ref 0–99)
NonHDL: 72.43
Total CHOL/HDL Ratio: 2
Triglycerides: 51 mg/dL (ref 0.0–149.0)
VLDL: 10.2 mg/dL (ref 0.0–40.0)

## 2020-04-01 LAB — PSA: PSA: 1.06 ng/mL (ref 0.10–4.00)

## 2020-04-01 MED ORDER — HALOBETASOL PROPIONATE 0.05 % EX CREA: TOPICAL_CREAM | Freq: Two times a day (BID) | CUTANEOUS | 2 refills | Status: AC

## 2020-04-01 NOTE — Assessment & Plan Note (Signed)
Chronic  Metoprolol, Losartan, ASA, Simvastatin

## 2020-04-01 NOTE — Assessment & Plan Note (Signed)
Meloxicam as needed 

## 2020-04-01 NOTE — Progress Notes (Signed)
Subjective:  Patient ID: [name] [name], adult    DOB: 11/06/1940  Age: 79 y.o. MRN: [id number]  CC: Annual Exam (Requesting rx for Halobetasol Cream 0.5%)   HPI [name] [name] presents for CAD, dyslipidemia, GERD. [name] [name] has lost wt on diet  Outpatient Medications Prior to Visit  Medication Sig Dispense Refill  . acetaminophen (TYLENOL) 500 MG tablet Take 500 mg by mouth every 6 (six) hours as needed.    [name] aspirin 81 MG tablet Take 81 mg by mouth daily.      [name] azelastine (OPTIVAR) 0.05 % ophthalmic solution Place 1 drop into both eyes 2 (two) times daily. 6 mL 6  . cholecalciferol (VITAMIN D) 1000 UNITS tablet Take 4,000 Units by mouth daily.     . clopidogrel (PLAVIX) 75 MG tablet TAKE 1 TABLET DAILY 90 tablet 3  . levothyroxine (SYNTHROID) 100 MCG tablet TAKE 1 TABLET BY MOUTH EVERY DAY 90 tablet 3  . losartan (COZAAR) 100 MG tablet TAKE 1 TABLET DAILY (NEEDS VISIT FOR FURTHER REFILLS) 90 tablet 3  . meloxicam (MOBIC) 7.5 MG tablet TAKE 1 TABLET (7.5 MG TOTAL) BY MOUTH DAILY AS NEEDED FOR PAIN AFTER A MEAL 30 tablet 2  . metoprolol succinate (TOPROL-XL) 25 MG 24 hr tablet Take 1 tablet (25 mg total) by mouth daily. 90 tablet 3  . Multiple Vitamins-Minerals (CENTRUM SILVER PO) Take 1 tablet by mouth daily.      . niacin (NIASPAN) 1000 MG CR tablet TAKE 2 TABLETS (2,000 MG TOTAL) BY MOUTH AT BEDTIME. 180 tablet 1  . nitroGLYCERIN (NITROSTAT) 0.4 MG SL tablet Place 1 tablet (0.4 mg total) under the tongue every 5 (five) minutes as needed for chest pain (3 doses MAX). 25 tablet 4  . Omega-3 Fatty Acids (FISH OIL) 1200 MG CAPS Take 1 capsule by mouth daily.     . pantoprazole (PROTONIX) 40 MG tablet TAKE 1 TABLET (40 MG TOTAL) BY MOUTH DAILY. TAKE 30 MINUTES BEFORE BREAKFAST. 90 tablet 1  . simvastatin (ZOCOR) 40 MG tablet Take 1 tablet (40 mg total) by mouth daily. 90 tablet 3   No facility-administered medications prior to visit.    ROS: Review of Systems  Constitutional:  Negative for activity change, appetite change, chills, fatigue and unexpected weight change.  HENT: Negative for congestion, mouth sores and sinus pressure.   Eyes: Negative for visual disturbance.  Respiratory: Negative for cough and chest tightness.   Gastrointestinal: Negative for abdominal pain and nausea.  Genitourinary: Negative for difficulty urinating and frequency.  Musculoskeletal: Negative for back pain and gait problem.  Skin: Negative for pallor and rash.  Neurological: Negative for dizziness, tremors, weakness, numbness and headaches.  Psychiatric/Behavioral: Negative for confusion and sleep disturbance.    Objective:  BP 134/68 (BP Location: Left Arm)   Pulse (!) 59   Temp 97.9 F (36.6 C) (Oral)   Wt 196 lb (88.9 kg)   SpO2 98%   BMI 25.86 kg/m   BP Readings from Last 3 Encounters:  04/01/20 134/68  04/05/19 (!) 141/60  03/01/19 122/64    Wt Readings from Last 3 Encounters:  04/01/20 196 lb (88.9 kg)  12/11/19 202 lb (91.6 kg)  04/05/19 210 lb (95.3 kg)    Physical Exam Constitutional:      General: He is not in acute distress.    Appearance: He is well-developed.  HENT:     Head: Normocephalic.     Right Ear: External ear normal.     Left  Ear: External ear normal.     Nose: Nose normal.  Eyes:     General:        Right eye: No discharge.        Left eye: No discharge.     Conjunctiva/sclera: Conjunctivae normal.     Pupils: Pupils are equal, round, and reactive to light.  Neck:     Thyroid: No thyromegaly.     Vascular: No JVD.     Trachea: No tracheal deviation.  Cardiovascular:     Rate and Rhythm: Normal rate and regular rhythm.     Heart sounds: Normal heart sounds.  Pulmonary:     Effort: No respiratory distress.     Breath sounds: No stridor. No wheezing.  Abdominal:     General: Bowel sounds are normal. There is no distension.     Palpations: Abdomen is soft. There is no mass.     Tenderness: There is no abdominal tenderness. There  is no guarding or rebound.  Genitourinary:    Prostate: Normal.     Rectum: Normal. Guaiac result negative.  Musculoskeletal:        General: No tenderness.     Cervical back: Normal range of motion and neck supple.  Lymphadenopathy:     Cervical: No cervical adenopathy.  Skin:    Findings: No erythema or rash.  Neurological:     Mental Status: He is oriented to person, place, and time.     Cranial Nerves: No cranial nerve deficit.     Motor: No abnormal muscle tone.     Coordination: Coordination normal.     Deep Tendon Reflexes: Reflexes normal.  Psychiatric:        Behavior: Behavior normal.        Thought Content: Thought content normal.        Judgment: Judgment normal.     Lab Results  Component Value Date   WBC 8.7 03/02/2019   HGB 13.1 03/02/2019   HCT 39.9 03/02/2019   PLT 216.0 03/02/2019   GLUCOSE 90 03/02/2019   CHOL 129 03/02/2019   TRIG 105.0 03/02/2019   HDL 52.20 03/02/2019   LDLCALC 56 03/02/2019   ALT 24 03/02/2019   AST 24 03/02/2019   NA 140 03/02/2019   K 4.1 03/02/2019   CL 103 03/02/2019   CREATININE 0.98 03/02/2019   BUN 13 03/02/2019   CO2 27 03/02/2019   TSH 3.45 03/02/2019   PSA 0.66 03/02/2019   INR 1.0 ratio 03/06/2010   HGBA1C 5.8 08/28/2013    VAS US CAROTID  Result Date: 03/15/2019 Carotid Arterial Duplex Study Indications:  Bilateral bruits. Risk Factors: Hypertension, past history of smoking, prior MI, coronary artery               disease. Performing Technologist: Mariane Masters RVT  Examination Guidelines: A complete evaluation includes B-mode imaging, spectral Doppler, color Doppler, and power Doppler as needed of all accessible portions of each vessel. Bilateral testing is considered an integral part of a complete examination. Limited examinations for reoccurring indications may be performed as noted.  Right Carotid Findings: +----------+--------+--------+--------+------------------+--------------+           PSV cm/sEDV  cm/sStenosisPlaque DescriptionComments       +----------+--------+--------+--------+------------------+--------------+ CCA Prox  174     13                                               +----------+--------+--------+--------+------------------+--------------+  CCA Distal81      12                                               +----------+--------+--------+--------+------------------+--------------+ ICA Prox  56      8               smooth            minimal plaque +----------+--------+--------+--------+------------------+--------------+ ICA Mid   70      15      1-39%                                    +----------+--------+--------+--------+------------------+--------------+ ICA Distal67      18                                               +----------+--------+--------+--------+------------------+--------------+ ECA       121     1                                                +----------+--------+--------+--------+------------------+--------------+ +----------+--------+-------+----------------+-------------------+           PSV cm/sEDV cmsDescribe        Arm Pressure (mmHG) +----------+--------+-------+----------------+-------------------+ Subclavian208            Multiphasic, [id number]                 +----------+--------+-------+----------------+-------------------+ +---------+--------+--+--------+-+---------+ VertebralPSV cm/s52EDV cm/s9Antegrade +---------+--------+--+--------+-+---------+  Left Carotid Findings: +----------+--------+--------+--------+----------------------+--------+           PSV cm/sEDV cm/sStenosisPlaque Description    Comments +----------+--------+--------+--------+----------------------+--------+ CCA Prox  106     13                                             +----------+--------+--------+--------+----------------------+--------+ CCA Distal84      16                                              +----------+--------+--------+--------+----------------------+--------+ ICA Prox  72      13      1-39%   focal and heterogenous         +----------+--------+--------+--------+----------------------+--------+ ICA Mid   71      19                                             +----------+--------+--------+--------+----------------------+--------+ ICA Distal45      14                                             +----------+--------+--------+--------+----------------------+--------+ ECA  139     11                                             +----------+--------+--------+--------+----------------------+--------+ +----------+--------+--------+----------------+-------------------+           PSV cm/sEDV cm/sDescribe        Arm Pressure (mmHG) +----------+--------+--------+----------------+-------------------+ Subclavian120             Multiphasic, [id number]                 +----------+--------+--------+----------------+-------------------+ +---------+--------+--+--------+--+---------+ VertebralPSV cm/s52EDV cm/s11Antegrade +---------+--------+--+--------+--+---------+  Summary: Right Carotid: Velocities in the right ICA are consistent with a 1-39% stenosis. Left Carotid: Velocities in the left ICA are consistent with a 1-39% stenosis. Vertebrals:  Bilateral vertebral arteries demonstrate antegrade flow. Subclavians: Normal flow hemodynamics were seen in bilateral subclavian              arteries. *See table(s) above for measurements and observations.  Electronically signed by Ena Dawley MD on 03/15/2019 at 10:28:31 AM.    Final     Assessment & Plan:   Walker Kehr, MD

## 2020-04-01 NOTE — Assessment & Plan Note (Signed)
On Simvastatin 

## 2020-04-02 ENCOUNTER — Other Ambulatory Visit: Payer: Self-pay | Admitting: Internal Medicine

## 2020-04-02 DIAGNOSIS — E876 Hypokalemia: Secondary | ICD-10-CM

## 2020-04-02 DIAGNOSIS — E034 Atrophy of thyroid (acquired): Secondary | ICD-10-CM

## 2020-04-02 MED ORDER — POTASSIUM CHLORIDE ER 10 MEQ PO TBCR: 10.0000 meq | EXTENDED_RELEASE_TABLET | Freq: Every day | ORAL | 0 refills | Status: DC

## 2020-04-14 ENCOUNTER — Other Ambulatory Visit: Payer: Self-pay | Admitting: Internal Medicine

## 2020-05-02 NOTE — Progress Notes (Signed)
Virtual Visit via Video Note   This visit type was conducted due to national recommendations for restrictions regarding the COVID-19 Pandemic (e.g. social distancing) in an effort to limit this patient's exposure and mitigate transmission in our community.  Due to his co-morbid illnesses, this patient is at least at moderate risk for complications without adequate follow up.  This format is felt to be most appropriate for this patient at this time.  All issues noted in this document were discussed and addressed.  A limited physical exam was performed with this format.  Please refer to the patient's chart for his consent to telehealth for [facility].   Date:  05/05/2020   ID:  [name] [name], DOB [birth year], MRN [id number]  Patient Location: Home Provider Location: Office  PCP:  Cassandria Anger, MD  Cardiologist:   Johnsie Cancel Electrophysiologist:  None   Evaluation Performed:  Follow-Up Visit  Chief Complaint:  CAD  History of Present Illness:    80 y.o. with CAD. Known CTO RCA, Previous cutting balloon D1 latter occluded in 2008. Last cath 2011 with 70% mid LAD Rx medically by Dr Olevia Perches.  Last myovue 12/11/19 old IMI no ischemia EF preserved 68% no change from July 2018 . Does taxes Active with no angina One son in [place] priest Another son a Chief Executive Officer in [place].   No angina or claudication. Son who is a priest getting an assignment in [place] with cloistered nuns  Weight is down about 14 lbs   The patient  does not have symptoms concerning for COVID-19 infection (fever, chills, cough, or new shortness of breath).   No angina. 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 Allergic rhinitis   . Allergy   . Arthritis    in neck  . CAD (coronary artery disease)   . Cataract    Had surg 08/2016  . CERUMEN IMPACTION 06/04/2009   5/15     . Colon polyp   . Diverticulosis   . Family hx of colon cancer    brother  . Hyperlipemia   . Hypertension   . Hypothyroidism   . MI (myocardial infarction) ([place]) 1993/2007   no stents  . OSA (obstructive sleep apnea)   . Psoriasis   . Sleep apnea    no cpap- had sleep study per pt no OSA   Past Surgical History:  Procedure Laterality Date  . CARDIAC CATHETERIZATION  1993  . COLONOSCOPY    . POLYPECTOMY    . PTCA    . TONSILLECTOMY          Allergies:   Patient has no known allergies.   Social History   Tobacco Use  . Smoking status: Former Smoker    Quit date: 04/1978    Years since quitting: 42.0  . Smokeless tobacco: Never Used  Vaping Use  . Vaping Use: Never used  Substance Use Topics  . Alcohol use: Yes    Alcohol/week: 6.0 standard drinks    Types: 6 Cans of beer per week  . Drug use: No     Family Hx: The patient's family history includes Bone cancer in his brother; COPD in his father; Cancer in his brother; Colon cancer in his brother, maternal aunt, maternal aunt, maternal uncle, paternal aunt, paternal grandfather, and another family member; Colon polyps in his  sister; Coronary artery disease in his mother; Heart disease in his mother; Hypertension in an other family member. There is no history of Esophageal cancer, Rectal cancer, or Stomach cancer.  ROS:   Please see the history of present illness.     All other systems reviewed and are negative.   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   Recent Labs: 04/01/2020: ALT 24; BUN 12; Creatinine, Ser 1.03; Hemoglobin 13.0; Platelets 246.0; Potassium 3.3; Sodium 137; TSH 5.18   Recent Lipid Panel Lab Results  Component Value Date/Time   CHOL 127 04/01/2020 01:52 PM   TRIG 51.0 04/01/2020 01:52 PM   TRIG 52 03/21/2006 08:20 AM   HDL 54.20 04/01/2020 01:52 PM   CHOLHDL 2  04/01/2020 01:52 PM   LDLCALC 62 04/01/2020 01:52 PM    Wt Readings from Last 3 Encounters:  05/05/20 86.6 kg  04/01/20 88.9 kg  12/11/19 91.6 kg     Objective:    Vital Signs:  Ht 6\' 1"  (1.854 m)   Wt 86.6 kg   BMI 25.20 kg/m    Skin warm and dry No distress No tachypnea No JVP elevation  Neuro appears non focal No edema  Telephone visit no exam   ASSESSMENT & PLAN:    CAD: Occluded D1 and RCA with moderate LAD disease non ischemic myovue 12/11/19  Continue medical RX  Very active with no angina    HTN: Well controlled.  Continue current medications and low sodium Dash type diet.    Thyroid  Continue replacement TSH normal 4.3 on 01/10/18  HLD:  Continue meds LDL at goal 60 on 56 on 04/01/20   PVD:  Calcium seen on lumbar film f/u US with >50% left iliac disease no claudication observe Carotids plaque no stenosis on [place] 03/15/19   COVID-19 Education: The signs and symptoms of COVID-19 were discussed with the patient and how to seek care for testing (follow up with PCP or arrange E-visit).  The importance of social distancing was discussed today.  Time:   Today, I have spent 30 minutes with the patient with telehealth technology discussing the above problems.  This includes chart review , stress test, lab review and composing note    Medication Adjustments/Labs and Tests Ordered: Current medicines are reviewed at length with the patient today.  Concerns regarding medicines are outlined above.   Tests Ordered:  None   Medication Changes:  None   Disposition:  Follow up in a year   Signed, Jenkins Rouge, MD  05/05/2020 7:51 AM    [place]

## 2020-05-05 ENCOUNTER — Telehealth (INDEPENDENT_AMBULATORY_CARE_PROVIDER_SITE_OTHER): Payer: Medicare Other | Admitting: Cardiovascular Disease

## 2020-05-05 VITALS — Ht 73.0 in | Wt 191.0 lb

## 2020-05-05 DIAGNOSIS — I2510 Atherosclerotic heart disease of native coronary artery without angina pectoris: Secondary | ICD-10-CM | POA: Diagnosis not present

## 2020-05-05 NOTE — Patient Instructions (Addendum)
Medication Instructions:  Your physician recommends that you continue on your current medications as directed. Please refer to the Current Medication list given to you today.  *If you need a refill on your cardiac medications before your next appointment, please call your pharmacy*  Lab Work: If you have labs (blood work) drawn today and your tests are completely normal, you will receive your results only by: . MyChart Message (if you have MyChart) OR . A paper copy in the mail If you have any lab test that is abnormal or we need to change your treatment, we will call you to review the results.  Testing/Procedures: None ordered today.  Follow-Up: At [facility], you and your health needs are our priority.  As part of our continuing mission to provide you with exceptional heart care, we have created designated Provider Care Teams.  These Care Teams include your primary Cardiologist (physician) and Advanced Practice Providers (APPs -  Physician Assistants and Nurse Practitioners) who all work together to provide you with the care you need, when you need it.  We recommend signing up for the patient portal called "MyChart".  Sign up information is provided on this After Visit Summary.  MyChart is used to connect with patients for Virtual Visits (Telemedicine).  Patients are able to view lab/test results, encounter notes, upcoming appointments, etc.  Non-urgent messages can be sent to your provider as well.   To learn more about what you can do with MyChart, go to [email or web address]://[email or web address].com.    Your next appointment:   6 month(s)  The format for your next appointment:   In Person  Provider:   You may see Peter Nishan, MD or one of the following Advanced Practice Providers on your designated Care Team:    Lori Gerhardt, NP  Laura Ingold, NP  Jill McDaniel, NP    

## 2020-05-10 ENCOUNTER — Other Ambulatory Visit: Payer: Self-pay | Admitting: Internal Medicine

## 2020-05-10 ENCOUNTER — Other Ambulatory Visit: Payer: Self-pay | Admitting: Cardiovascular Disease

## 2020-06-28 ENCOUNTER — Other Ambulatory Visit: Payer: Self-pay | Admitting: Internal Medicine

## 2020-07-01 ENCOUNTER — Telehealth: Payer: Self-pay | Admitting: Emergency Medicine

## 2020-07-01 NOTE — Telephone Encounter (Signed)
Pt called and wants to know if he needs to come in and get his potassium checked. Please give patient a call back thanks.

## 2020-07-03 NOTE — Telephone Encounter (Signed)
Yes pls - labs were ordered in Dec 2022 Thx

## 2020-07-03 NOTE — Telephone Encounter (Signed)
Notifed pt w/MD response. Pt will go to [facility] to have done.Marland KitchenJohny [name]

## 2020-07-04 ENCOUNTER — Other Ambulatory Visit (INDEPENDENT_AMBULATORY_CARE_PROVIDER_SITE_OTHER): Payer: Medicare Other

## 2020-07-04 DIAGNOSIS — E034 Atrophy of thyroid (acquired): Secondary | ICD-10-CM

## 2020-07-04 DIAGNOSIS — E876 Hypokalemia: Secondary | ICD-10-CM | POA: Diagnosis not present

## 2020-07-04 LAB — BASIC METABOLIC PANEL
BUN: 15 mg/dL (ref 6–23)
CO2: 25 mEq/L (ref 19–32)
Calcium: 9.5 mg/dL (ref 8.4–10.5)
Chloride: 103 mEq/L (ref 96–112)
Creatinine, Ser: 1.08 mg/dL (ref 0.40–1.50)
GFR: 65.39 mL/min (ref >60.00)
Glucose, Bld: 86 mg/dL (ref 70–99)
Potassium: 4.2 mEq/L (ref 3.5–5.1)
Sodium: 138 mEq/L (ref 135–145)

## 2020-07-04 LAB — T4, FREE: Free T4: 0.95 ng/dL (ref 0.60–1.60)

## 2020-07-04 LAB — TSH: TSH: 6.66 u[IU]/mL — ABNORMAL HIGH (ref 0.35–4.50)

## 2020-07-22 ENCOUNTER — Other Ambulatory Visit: Payer: Self-pay | Admitting: Internal Medicine

## 2020-08-06 ENCOUNTER — Other Ambulatory Visit: Payer: Self-pay | Admitting: Internal Medicine

## 2020-09-05 ENCOUNTER — Ambulatory Visit: Payer: Medicare Other

## 2020-09-11 ENCOUNTER — Other Ambulatory Visit: Payer: Self-pay

## 2020-09-11 ENCOUNTER — Ambulatory Visit (INDEPENDENT_AMBULATORY_CARE_PROVIDER_SITE_OTHER): Payer: Medicare Other

## 2020-09-11 DIAGNOSIS — Z0000 Encounter for general adult medical examination without abnormal findings: Secondary | ICD-10-CM | POA: Diagnosis not present

## 2020-09-11 NOTE — Progress Notes (Addendum)
Subjective:   [name] [name] is a 80 y.o. male who presents for Medicare Annual/Subsequent preventive examination.  Review of Systems    No ROS. Medicare Wellness Visit. Additional risk factors are reflected in social history. Cardiac Risk Factors include: advanced age (>75men, >24 women);family history of premature cardiovascular disease;hypertension;male gender     Objective:    Today's Vitals   09/11/20 1018  BP: 122/80  Pulse: 60  Temp: 98.1 F (36.7 C)  SpO2: 98%  Weight: 208 lb 9.6 oz (94.6 kg)  Height: 6\' 1"  (1.854 m)  PainSc: 0-No pain   Body mass index is 27.52 kg/m.  Advanced Directives 09/11/2020 05/20/2017 11/11/2016 02/19/2016 10/08/2014 03/10/2014  Does Patient Have a Medical Advance Directive? No No No No No No  Would patient like information on creating a medical advance directive? Yes (MAU/Ambulatory/Procedural Areas - Information given) - No - Patient declined - No - patient declined information No - patient declined information    Current Medications (verified) Outpatient Encounter Medications as of 09/11/2020  Medication Sig   acetaminophen (TYLENOL) 500 MG tablet Take 500 mg by mouth every 6 (six) hours as needed.   aspirin 81 MG tablet Take 81 mg by mouth daily.   azelastine (OPTIVAR) 0.05 % ophthalmic solution Place 1 drop into both eyes 2 (two) times daily.   cholecalciferol (VITAMIN D) 1000 UNITS tablet Take 4,000 Units by mouth daily.    clopidogrel (PLAVIX) 75 MG tablet TAKE 1 TABLET BY MOUTH EVERY DAY   halobetasol (ULTRAVATE) 0.05 % cream Apply topically 2 (two) times daily.   levothyroxine (SYNTHROID) 100 MCG tablet TAKE 1 TABLET BY MOUTH EVERY DAY   losartan (COZAAR) 100 MG tablet TAKE 1 TABLET BY MOUTH EVERY DAY   meloxicam (MOBIC) 7.5 MG tablet TAKE 1 TABLET (7.5 MG TOTAL) BY MOUTH DAILY AS NEEDED FOR PAIN AFTER A MEAL   metoprolol succinate (TOPROL-XL) 25 MG 24 hr tablet Take 1 tablet (25 mg total) by mouth daily.   Multiple  Vitamins-Minerals (CENTRUM SILVER PO) Take 1 tablet by mouth daily.   niacin (NIASPAN) 1000 MG CR tablet TAKE 2 TABLETS (2,000 MG TOTAL) BY MOUTH AT BEDTIME.   nitroGLYCERIN (NITROSTAT) 0.4 MG SL tablet Place 1 tablet (0.4 mg total) under the tongue every 5 (five) minutes as needed for chest pain (3 doses MAX).   Omega-3 Fatty Acids (FISH OIL) 1200 MG CAPS Take 1 capsule by mouth daily.   pantoprazole (PROTONIX) 40 MG tablet TAKE 1 TABLET (40 MG TOTAL) BY MOUTH DAILY. TAKE 30 MINUTES BEFORE BREAKFAST.   potassium chloride (KLOR-CON) 10 MEQ tablet TAKE 1 TABLET BY MOUTH EVERY DAY   simvastatin (ZOCOR) 40 MG tablet Take 1 tablet (40 mg total) by mouth daily.   No facility-administered encounter medications on file as of 09/11/2020.    Allergies (verified) Patient has no known allergies.   History: Past Medical History:  Diagnosis Date   Allergic rhinitis    Allergy    Arthritis    in neck   CAD (coronary artery disease)    Cataract    Had surg 08/2016   CERUMEN IMPACTION 06/04/2009   5/15     Colon polyp    Diverticulosis    Family hx of colon cancer    brother   Hyperlipemia    Hypertension    Hypothyroidism    MI (myocardial infarction) ([place]) 1993/2007   no stents   OSA (obstructive sleep apnea)    Psoriasis    Sleep  apnea    no cpap- had sleep study per pt no OSA   Past Surgical History:  Procedure Laterality Date   CARDIAC CATHETERIZATION  1993   COLONOSCOPY     POLYPECTOMY     PTCA     TONSILLECTOMY     Family History  Problem Relation Age of Onset   Colon cancer Brother        died 50   Coronary artery disease Mother    Heart disease Mother    Colon polyps Sister        x2   COPD Father    Colon cancer Paternal Grandfather    Cancer Brother    Bone cancer Brother    Hypertension Other    Colon cancer Other    Colon cancer Maternal Aunt    Colon cancer Maternal Uncle    Colon cancer Maternal Aunt    Colon cancer Paternal Aunt    Esophageal cancer Neg  Hx    Rectal cancer Neg Hx    Stomach cancer Neg Hx    Social History   Socioeconomic History   Marital status: Married    Spouse name: Not on file   Number of children: 2   Years of education: Not on file   Highest education level: Not on file  Occupational History   Occupation: retired    Fish farm manager: SELF-EMPLOYED Engineer, 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   Tobacco Use   Smoking status: Former Smoker    Quit date: 04/1978    Years since quitting: 42.4   Smokeless tobacco: Never Used  Vaping Use   Vaping Use: Never used  Substance and Sexual Activity   Alcohol use: Yes    Alcohol/week: 6.0 standard drinks    Types: 6 Cans of beer per week   Drug use: No   Sexual activity: Not Currently  Other Topics Concern   Not on file  Social History Narrative   Not on file   Social Determinants of Health   Financial Resource Strain: Low Risk    Difficulty of Paying Living Expenses: Not hard at all  Food Insecurity: No Food Insecurity   Worried About Charity fundraiser in the Last Year: Never true   [place] in the Last Year: Never true  Transportation Needs: No Transportation Needs   Lack of Transportation (Medical): No   Lack of Transportation (Non-Medical): No  Physical Activity: Sufficiently Active   Days of Exercise per Week: 5 days   Minutes of Exercise per Session: 30 min  Stress: No Stress Concern Present   Feeling of Stress : Not at all  Social Connections: Socially Integrated   Frequency of Communication with Friends and Family: More than three times a week   Frequency of Social Gatherings with Friends and Family: Once a week   Attends Religious Services: More than 4 times per year   Active Member of [employer] or Organizations: Yes   Attends Music therapist: More than 4 times per year   Marital Status: Married    Tobacco Counseling Counseling given: Not Answered   Clinical Intake:  Pre-visit preparation completed: Yes  Pain : No/denies  pain Pain Score: 0-No pain     BMI - recorded: 27.52 Nutritional Status: BMI 25 -29 Overweight Nutritional Risks: None Diabetes: No  How often do you need to have someone help you when you read instructions, pamphlets, or other written materials from your doctor or pharmacy?: 1 -  Never What is the last grade level you completed in school?: Bachelor's Degree  Diabetic? no  Interpreter Needed?: No  Information entered by :: Lisette Abu, LPN   Activities of Daily Living In your present state of health, do you have any difficulty performing the following activities: 09/11/2020  Hearing? N  Vision? N  Difficulty concentrating or making decisions? N  Walking or climbing stairs? N  Dressing or bathing? N  Doing errands, shopping? N  Preparing Food and eating ? N  Using the Toilet? N  In the past six months, have you accidently leaked urine? N  Do you have problems with loss of bowel control? N  Managing your Medications? N  Managing your Finances? N  Housekeeping or managing your Housekeeping? N  Some recent data might be hidden    Patient Care Team: Plotnikov, Evie Lacks, MD as PCP - General Josue Hector, MD as PCP - Cardiology (Cardiology) Josue Hector, MD (Cardiology) Sable Feil, MD as Attending Physician (Gastroenterology) Clent Jacks, MD (Ophthalmology)  Indicate any recent Medical Services you may have received from other than [facility] providers in the past year (date may be approximate).     Assessment:   This is a routine wellness examination for [name] [name].  Hearing/Vision screen No exam data present  Dietary issues and exercise activities discussed: Current Exercise Habits: Home exercise routine, Type of exercise: walking;treadmill;Other - see comments (bike riding), Time (Minutes): 30, Frequency (Times/Week): 5, Weekly Exercise (Minutes/Week): 150, Intensity: Moderate, Exercise limited by: None identified  Goals Addressed             This  Visit's Progress    Patient Stated       My goal is to stay out of the wheelchair and walker.  Continue to stay active and independent as much as possible.       Depression Screen PHQ 2/9 Scores 09/11/2020 04/01/2020 03/01/2019 11/30/2017 11/11/2016 10/21/2016 10/15/2015  PHQ - 2 Score 0 0 0 0 0 0 0    Fall Risk Fall Risk  09/11/2020 04/01/2020 03/01/2019 11/30/2017 11/11/2016  Falls in the past year? 0 0 0 No Yes  Number falls in past yr: 0 0 - - 1  Injury with Fall? 0 0 - - -  Risk for fall due to : No Fall Risks - - - -  Follow up Falls evaluation completed - Falls evaluation completed - -    FALL RISK PREVENTION PERTAINING TO THE HOME:  Any stairs in or around the home? Yes  If so, are there any without handrails? No  Home free of loose throw rugs in walkways, pet beds, electrical cords, etc? Yes  Adequate lighting in your home to reduce risk of falls? Yes   ASSISTIVE DEVICES UTILIZED TO PREVENT FALLS:  Life alert? No  Use of a cane, walker or w/c? No  Grab bars in the bathroom? No  Shower chair or bench in shower? No  Elevated toilet seat or a handicapped toilet? No   TIMED UP AND GO:  Was the test performed? No .  Length of time to ambulate 10 feet: 0 sec.   Gait steady and fast without use of assistive device  Cognitive Function: Normal cognitive status assessed by direct observation by this Nurse Health Advisor. No abnormalities found.          Immunizations Immunization History  Administered Date(s) Administered   Fluad Quad(high Dose 65+) 03/01/2019, 03/27/2020   Influenza Whole 04/14/2007, 06/05/2010   Influenza, High  Dose Seasonal PF 05/14/2016, 02/26/2017   Influenza,inj,Quad PF,6+ Mos 05/16/2014   Influenza,trivalent, recombinat, inj, PF 12/22/2017   PFIZER(Purple Top)SARS-COV-2 Vaccination 05/07/2019, 05/27/2019   Pneumococcal Conjugate-13 08/28/2013   Pneumococcal Polysaccharide-23 06/04/2009   Td 08/16/2011   Tdap 10/21/2016   Zoster 06/05/2010    Zoster Recombinat (Shingrix) 03/11/2017, 05/24/2017    TDAP status: Up to date  Flu Vaccine status: Up to date  Pneumococcal vaccine status: Up to date  Covid-19 vaccine status: Completed vaccines  Qualifies for Shingles Vaccine? Yes   Zostavax completed Yes   Shingrix Completed?: Yes  Screening Tests Health Maintenance  Topic Date Due   Hepatitis C Screening  Never done   DEXA SCAN  Never done   COVID-19 Vaccine (3 - Pfizer risk 4-dose series) 06/24/2019   INFLUENZA VACCINE  11/24/2020   COLONOSCOPY (Pts 45-60yrs Insurance coverage will need to be confirmed)  04/04/2024   TETANUS/TDAP  10/22/2026   PNA vac Low Risk Adult  Completed   HPV VACCINES  Aged Out    Health Maintenance  Health Maintenance Due  Topic Date Due   Hepatitis C Screening  Never done   DEXA SCAN  Never done   COVID-19 Vaccine (3 - Pfizer risk 4-dose series) 06/24/2019    Colorectal cancer screening: No longer required.   Lung Cancer Screening: (Low Dose CT Chest recommended if Age 79-80 years, 30 pack-year currently smoking OR have quit w/in 15years.) does not qualify.   Lung Cancer Screening Referral: no  Additional Screening:  Hepatitis C Screening: does qualify; Completed no  Vision Screening: Recommended annual ophthalmology exams for early detection of glaucoma and other disorders of the eye. Is the patient up to date with their annual eye exam?  Yes  Who is the provider or what is the name of the office in which the patient attends annual eye exams? [facility] If pt is not established with a provider, would they like to be referred to a provider to establish care? No .   Dental Screening: Recommended annual dental exams for proper oral hygiene  Community Resource Referral / Chronic Care Management: CRR required this visit?  No   CCM required this visit?  No      Plan:     I have personally reviewed and noted the following in the patient's chart:   Medical and social  history Use of alcohol, tobacco or illicit drugs  Current medications and supplements including opioid prescriptions. Patient is not currently taking opioid prescriptions. Functional ability and status Nutritional status Physical activity Advanced directives List of other physicians Hospitalizations, surgeries, and ER visits in previous 12 months Vitals Screenings to include cognitive, depression, and falls Referrals and appointments  In addition, I have reviewed and discussed with patient certain preventive protocols, quality metrics, and best practice recommendations. A written personalized care plan for preventive services as well as general preventive health recommendations were provided to patient.     Sheral Flow, LPN   [id number]   Nurse Notes: n/a  Medical screening examination/treatment/procedure(s) were performed by non-physician practitioner and as supervising physician I was immediately available for consultation/collaboration.  I agree with above. Lew Dawes, MD

## 2020-09-11 NOTE — Patient Instructions (Addendum)
[name] [name] , Thank you for taking time to come for your Medicare Wellness Visit. I appreciate your ongoing commitment to your health goals. Please review the following plan we discussed and let me know if I can assist you in the future.   Screening recommendations/referrals: Colonoscopy: 04/05/2019; due every 5 years Recommended yearly ophthalmology/optometry visit for glaucoma screening and checkup Recommended yearly dental visit for hygiene and checkup  Vaccinations: Influenza vaccine: 03/27/2020 Pneumococcal vaccine: 06/04/2009, 08/28/2013 Tdap vaccine: 10/21/2016; due every 10 years Shingles vaccine: 03/01/2017, 05/24/2017   Covid-19: 05/07/2019, 05/27/2019  Advanced directives: Advance directive discussed with you today. I have provided a copy for you to complete at home and have notarized. Once this is complete please bring a copy in to our office so we can scan it into your chart.  Conditions/risks identified: Yes; Reviewed health maintenance screenings with patient today and relevant education, vaccines, and/or referrals were provided. Please continue to do your personal lifestyle choices by: daily care of teeth and gums, regular physical activity (goal should be 5 days a week for 30 minutes), eat a healthy diet, avoid tobacco and drug use, limiting any alcohol intake, taking a low-dose aspirin (if not allergic or have been advised by your provider otherwise) and taking vitamins and minerals as recommended by your provider. Continue doing brain stimulating activities (puzzles, reading, adult coloring books, staying active) to keep memory sharp. Continue to eat heart healthy diet (full of fruits, vegetables, whole grains, lean protein, water--limit salt, fat, and sugar intake) and increase physical activity as tolerated.  Next appointment: Please schedule your next Medicare Wellness Visit with your Nurse Health Advisor in 1 year by calling [phone].  Preventive Care 23 Years and Older,  Male Preventive care refers to lifestyle choices and visits with your health care provider that can promote health and wellness. What does preventive care include?  A yearly physical exam. This is also called an annual well check.  Dental exams once or twice a year.  Routine eye exams. Ask your health care provider how often you should have your eyes checked.  Personal lifestyle choices, including:  Daily care of your teeth and gums.  Regular physical activity.  Eating a healthy diet.  Avoiding tobacco and drug use.  Limiting alcohol use.  Practicing safe sex.  Taking low doses of aspirin every day.  Taking vitamin and mineral supplements as recommended by your health care provider. What happens during an annual well check? The services and screenings done by your health care provider during your annual well check will depend on your age, overall health, lifestyle risk factors, and family history of disease. Counseling  Your health care provider may ask you questions about your:  Alcohol use.  Tobacco use.  Drug use.  Emotional well-being.  Home and relationship well-being.  Sexual activity.  Eating habits.  History of falls.  Memory and ability to understand (cognition).  Work and work Statistician. Screening  You may have the following tests or measurements:  Height, weight, and BMI.  Blood pressure.  Lipid and cholesterol levels. These may be checked every 5 years, or more frequently if you are over 76 years old.  Skin check.  Lung cancer screening. You may have this screening every year starting at age 26 if you have a 30-pack-year history of smoking and currently smoke or have quit within the past 15 years.  Fecal occult blood test (FOBT) of the stool. You may have this test every year starting at age 24.  Flexible sigmoidoscopy or colonoscopy. You may have a sigmoidoscopy every 5 years or a colonoscopy every 10 years starting at age  10.  Prostate cancer screening. Recommendations will vary depending on your family history and other risks.  Hepatitis C blood test.  Hepatitis B blood test.  Sexually transmitted disease (STD) testing.  Diabetes screening. This is done by checking your blood sugar (glucose) after you have not eaten for a while (fasting). You may have this done every 1-3 years.  Abdominal aortic aneurysm (AAA) screening. You may need this if you are a current or former smoker.  Osteoporosis. You may be screened starting at age 19 if you are at high risk. Talk with your health care provider about your test results, treatment options, and if necessary, the need for more tests. Vaccines  Your health care provider may recommend certain vaccines, such as:  Influenza vaccine. This is recommended every year.  Tetanus, diphtheria, and acellular pertussis (Tdap, Td) vaccine. You may need a Td booster every 10 years.  Zoster vaccine. You may need this after age 72.  Pneumococcal 13-valent conjugate (PCV13) vaccine. One dose is recommended after age 1.  Pneumococcal polysaccharide (PPSV23) vaccine. One dose is recommended after age 61. Talk to your health care provider about which screenings and vaccines you need and how often you need them. This information is not intended to replace advice given to you by your health care provider. Make sure you discuss any questions you have with your health care provider. Document Released: 05/09/2015 Document Revised: 12/31/2015 Document Reviewed: 02/11/2015 Elsevier Interactive Patient Education  2017 [employer] Prevention in the Home Falls can cause injuries. They can happen to people of all ages. There are many things you can do to make your home safe and to help prevent falls. What can I do on the outside of my home?  Regularly fix the edges of walkways and driveways and fix any cracks.  Remove anything that might make you trip as you walk through a  door, such as a raised step or threshold.  Trim any bushes or trees on the path to your home.  Use bright outdoor lighting.  Clear any walking paths of anything that might make someone trip, such as rocks or tools.  Regularly check to see if handrails are loose or broken. Make sure that both sides of any steps have handrails.  Any raised decks and porches should have guardrails on the edges.  Have any leaves, snow, or ice cleared regularly.  Use sand or salt on walking paths during winter.  Clean up any spills in your garage right away. This includes oil or grease spills. What can I do in the bathroom?  Use night lights.  Install grab bars by the toilet and in the tub and shower. Do not use towel bars as grab bars.  Use non-skid mats or decals in the tub or shower.  If you need to sit down in the shower, use a plastic, non-slip stool.  Keep the floor dry. Clean up any water that spills on the floor as soon as it happens.  Remove soap buildup in the tub or shower regularly.  Attach bath mats securely with double-sided non-slip rug tape.  Do not have throw rugs and other things on the floor that can make you trip. What can I do in the bedroom?  Use night lights.  Make sure that you have a light by your bed that is easy to reach.  Do  not use any sheets or blankets that are too big for your bed. They should not hang down onto the floor.  Have a firm chair that has side arms. You can use this for support while you get dressed.  Do not have throw rugs and other things on the floor that can make you trip. What can I do in the kitchen?  Clean up any spills right away.  Avoid walking on wet floors.  Keep items that you use a lot in easy-to-reach places.  If you need to reach something above you, use a strong step stool that has a grab bar.  Keep electrical cords out of the way.  Do not use floor polish or wax that makes floors slippery. If you must use wax, use  non-skid floor wax.  Do not have throw rugs and other things on the floor that can make you trip. What can I do with my stairs?  Do not leave any items on the stairs.  Make sure that there are handrails on both sides of the stairs and use them. Fix handrails that are broken or loose. Make sure that handrails are as long as the stairways.  Check any carpeting to make sure that it is firmly attached to the stairs. Fix any carpet that is loose or worn.  Avoid having throw rugs at the top or bottom of the stairs. If you do have throw rugs, attach them to the floor with carpet tape.  Make sure that you have a light switch at the top of the stairs and the bottom of the stairs. If you do not have them, ask someone to add them for you. What else can I do to help prevent falls?  Wear shoes that:  Do not have high heels.  Have rubber bottoms.  Are comfortable and fit you well.  Are closed at the toe. Do not wear sandals.  If you use a stepladder:  Make sure that it is fully opened. Do not climb a closed stepladder.  Make sure that both sides of the stepladder are locked into place.  Ask someone to hold it for you, if possible.  Clearly mark and make sure that you can see:  Any grab bars or handrails.  First and last steps.  Where the edge of each step is.  Use tools that help you move around (mobility aids) if they are needed. These include:  Canes.  Walkers.  Scooters.  Crutches.  Turn on the lights when you go into a dark area. Replace any light bulbs as soon as they burn out.  Set up your furniture so you have a clear path. Avoid moving your furniture around.  If any of your floors are uneven, fix them.  If there are any pets around you, be aware of where they are.  Review your medicines with your doctor. Some medicines can make you feel dizzy. This can increase your chance of falling. Ask your doctor what other things that you can do to help prevent falls. This  information is not intended to replace advice given to you by your health care provider. Make sure you discuss any questions you have with your health care provider. Document Released: 02/06/2009 Document Revised: 09/18/2015 Document Reviewed: 05/17/2014 Elsevier Interactive Patient Education  2017 [employer].

## 2020-10-18 ENCOUNTER — Other Ambulatory Visit: Payer: Self-pay | Admitting: Internal Medicine

## 2020-10-30 DIAGNOSIS — H26492 Other secondary cataract, left eye: Secondary | ICD-10-CM | POA: Diagnosis not present

## 2020-10-30 DIAGNOSIS — Z961 Presence of intraocular lens: Secondary | ICD-10-CM | POA: Diagnosis not present

## 2020-10-30 DIAGNOSIS — H353111 Nonexudative age-related macular degeneration, right eye, early dry stage: Secondary | ICD-10-CM | POA: Diagnosis not present

## 2020-10-30 DIAGNOSIS — D3132 Benign neoplasm of left choroid: Secondary | ICD-10-CM | POA: Diagnosis not present

## 2020-10-30 DIAGNOSIS — H02834 Dermatochalasis of left upper eyelid: Secondary | ICD-10-CM | POA: Diagnosis not present

## 2020-10-30 DIAGNOSIS — H02831 Dermatochalasis of right upper eyelid: Secondary | ICD-10-CM | POA: Diagnosis not present

## 2020-11-18 DIAGNOSIS — H1131 Conjunctival hemorrhage, right eye: Secondary | ICD-10-CM | POA: Diagnosis not present

## 2021-01-15 ENCOUNTER — Other Ambulatory Visit: Payer: Self-pay | Admitting: Internal Medicine

## 2021-01-19 ENCOUNTER — Other Ambulatory Visit: Payer: Self-pay | Admitting: Internal Medicine

## 2021-03-12 ENCOUNTER — Ambulatory Visit (INDEPENDENT_AMBULATORY_CARE_PROVIDER_SITE_OTHER): Payer: Medicare Other

## 2021-03-12 ENCOUNTER — Other Ambulatory Visit: Payer: Self-pay

## 2021-03-12 DIAGNOSIS — Z23 Encounter for immunization: Secondary | ICD-10-CM | POA: Diagnosis not present

## 2021-04-08 ENCOUNTER — Other Ambulatory Visit: Payer: Self-pay | Admitting: Internal Medicine

## 2021-04-16 ENCOUNTER — Ambulatory Visit (INDEPENDENT_AMBULATORY_CARE_PROVIDER_SITE_OTHER): Payer: Medicare Other | Admitting: Internal Medicine

## 2021-04-16 ENCOUNTER — Other Ambulatory Visit: Payer: Self-pay

## 2021-04-16 ENCOUNTER — Encounter: Payer: Self-pay | Admitting: Internal Medicine

## 2021-04-16 VITALS — BP 134/70 | HR 59 | Temp 98.3°F | Ht 73.0 in | Wt 213.8 lb

## 2021-04-16 DIAGNOSIS — M7632 Iliotibial band syndrome, left leg: Secondary | ICD-10-CM

## 2021-04-16 DIAGNOSIS — M7062 Trochanteric bursitis, left hip: Secondary | ICD-10-CM

## 2021-04-16 DIAGNOSIS — I10 Essential (primary) hypertension: Secondary | ICD-10-CM | POA: Diagnosis not present

## 2021-04-16 DIAGNOSIS — I2510 Atherosclerotic heart disease of native coronary artery without angina pectoris: Secondary | ICD-10-CM | POA: Diagnosis not present

## 2021-04-16 MED ORDER — TRAMADOL HCL 50 MG PO TABS: 50.0000 mg | ORAL_TABLET | Freq: Four times a day (QID) | ORAL | 1 refills | Status: AC | PRN

## 2021-04-16 MED ORDER — DICLOFENAC SODIUM 1 % EX GEL: 1.0000 "application " | Freq: Four times a day (QID) | CUTANEOUS | 3 refills | Status: AC

## 2021-04-16 NOTE — Assessment & Plan Note (Signed)
Start PT Ice Rice sock TENS unit Spiky ball

## 2021-04-16 NOTE — Progress Notes (Signed)
Subjective:  Patient ID: [name] [name], adult    DOB: [birth year]  Age: 80 y.o. MRN: [id number]  CC: Follow-up (Want to discuss potassium pill)   HPI [name] [name] presents for L hip problem, L thigh pain x 2-3 months; severe at times  Outpatient Medications Prior to Visit  Medication Sig Dispense Refill   acetaminophen (TYLENOL) 500 MG tablet Take 500 mg by mouth every 6 (six) hours as needed.     aspirin 81 MG tablet Take 81 mg by mouth daily.     azelastine (OPTIVAR) 0.05 % ophthalmic solution Place 1 drop into both eyes 2 (two) times daily. 6 mL 6   cholecalciferol (VITAMIN D) 1000 UNITS tablet Take 4,000 Units by mouth daily.      clopidogrel (PLAVIX) 75 MG tablet TAKE 1 TABLET BY MOUTH EVERY DAY 90 tablet 3   halobetasol (ULTRAVATE) 0.05 % cream Apply topically 2 (two) times daily. 50 g 2   levothyroxine (SYNTHROID) 100 MCG tablet TAKE 1 TABLET BY MOUTH EVERY DAY ANNUAL APPT DUE IN DEC MUST SEE PROVIDER FOR FUTURE REFILLS 90 tablet 0   losartan (COZAAR) 100 MG tablet TAKE 1 TABLET BY MOUTH EVERY DAY 90 tablet 3   metoprolol succinate (TOPROL-XL) 25 MG 24 hr tablet Take 1 tablet (25 mg total) by mouth daily. Annual appt due in Dec must see provider for future refills 90 tablet 0   Multiple Vitamins-Minerals (CENTRUM SILVER PO) Take 1 tablet by mouth daily.     niacin (NIASPAN) 1000 MG CR tablet TAKE 2 TABLETS (2,000 MG TOTAL) BY MOUTH AT BEDTIME. 180 tablet 3   nitroGLYCERIN (NITROSTAT) 0.4 MG SL tablet Place 1 tablet (0.4 mg total) under the tongue every 5 (five) minutes as needed for chest pain (3 doses MAX). 25 tablet 4   Omega-3 Fatty Acids (FISH OIL) 1200 MG CAPS Take 1 capsule by mouth daily.     pantoprazole (PROTONIX) 40 MG tablet TAKE 1 TABLET (40 MG TOTAL) BY MOUTH IN THE MORNING. MUST HAVE OFFICE VISIT FOR FURTHER REFILLS 90 tablet 0   potassium chloride (KLOR-CON) 10 MEQ tablet TAKE 1 TABLET BY MOUTH EVERY DAY 90 tablet 3   simvastatin (ZOCOR) 40 MG tablet Take 1  tablet (40 mg total) by mouth daily. Annual appt due in Dec must see provider for future refills 90 tablet 0   meloxicam (MOBIC) 7.5 MG tablet Take 1 tablet (7.5 mg total) by mouth daily as needed for pain (FOR PAIN AFTER A MEAL). Annual appt due in [place]  must see provider for future refills 30 tablet 2   No facility-administered medications prior to visit.    ROS: Review of Systems  Constitutional:  Negative for activity change, appetite change, chills, fatigue and unexpected weight change.  HENT:  Negative for congestion, mouth sores and sinus pressure.   Eyes:  Negative for visual disturbance.  Respiratory:  Negative for cough and chest tightness.   Gastrointestinal:  Negative for abdominal pain and nausea.  Genitourinary:  Negative for difficulty urinating, frequency and vaginal pain.  Musculoskeletal:  Positive for arthralgias and gait problem. Negative for back pain.  Skin:  Negative for pallor and rash.  Neurological:  Negative for dizziness, tremors, weakness, numbness and headaches.  Psychiatric/Behavioral:  Negative for confusion and sleep disturbance.    Objective:  BP 134/70 (BP Location: Left Arm)    Pulse (!) 59    Temp 98.3 F (36.8 C) (Oral)    Ht 6\' 1"  (1.854 m)  Wt 213 lb 12.8 oz (97 kg)    SpO2 97%    BMI 28.21 kg/m   BP Readings from Last 3 Encounters:  04/16/21 134/70  09/11/20 122/80  04/01/20 134/68    Wt Readings from Last 3 Encounters:  04/16/21 213 lb 12.8 oz (97 kg)  09/11/20 208 lb 9.6 oz (94.6 kg)  05/05/20 191 lb (86.6 kg)    Physical Exam Constitutional:      General: He is not in acute distress.    Appearance: He is well-developed.     Comments: NAD  Eyes:     Conjunctiva/sclera: Conjunctivae normal.     Pupils: Pupils are equal, round, and reactive to light.  Neck:     Thyroid: No thyromegaly.     Vascular: No JVD.  Cardiovascular:     Rate and Rhythm: Normal rate and regular rhythm.     Heart sounds: Normal heart sounds. No murmur  heard.   No friction rub. No gallop.  Pulmonary:     Effort: Pulmonary effort is normal. No respiratory distress.     Breath sounds: Normal breath sounds. No wheezing or rales.  Chest:     Chest wall: No tenderness.  Abdominal:     General: Bowel sounds are normal. There is no distension.     Palpations: Abdomen is soft. There is no mass.     Tenderness: There is no abdominal tenderness. There is no guarding or rebound.  Musculoskeletal:        General: Tenderness present. Normal range of motion.     Cervical back: Normal range of motion.  Lymphadenopathy:     Cervical: No cervical adenopathy.  Skin:    General: Skin is warm and dry.     Findings: No rash.  Neurological:     Mental Status: He is alert and oriented to person, place, and time.     Cranial Nerves: No cranial nerve deficit.     Motor: No abnormal muscle tone.     Coordination: Coordination normal.     Gait: Gait normal.     Deep Tendon Reflexes: Reflexes are normal and symmetric.  Psychiatric:        Behavior: Behavior normal.        Thought Content: Thought content normal.        Judgment: Judgment normal.   Left hip trochanter major and left IT band is tender to palpation Straight leg elevation is negative bilaterally Lab Results  Component Value Date   WBC 8.0 04/01/2020   HGB 13.0 04/01/2020   HCT 39.5 04/01/2020   PLT 246.0 04/01/2020   GLUCOSE 86 07/04/2020   CHOL 127 04/01/2020   TRIG 51.0 04/01/2020   HDL 54.20 04/01/2020   LDLCALC 62 04/01/2020   ALT 24 04/01/2020   AST 25 04/01/2020   NA 138 07/04/2020   K 4.2 07/04/2020   CL 103 07/04/2020   CREATININE 1.08 07/04/2020   BUN 15 07/04/2020   CO2 25 07/04/2020   TSH 6.66 (H) 07/04/2020   PSA 1.06 04/01/2020   INR 1.0 ratio 03/06/2010   HGBA1C 5.8 08/28/2013    VAS US CAROTID  Result Date: 03/15/2019 Carotid Arterial Duplex Study Indications:  Bilateral bruits. Risk Factors: Hypertension, past history of smoking, prior MI, coronary  artery               disease. Performing Technologist: Mariane Masters RVT  Examination Guidelines: A complete evaluation includes B-mode imaging, spectral Doppler, color Doppler, and power Doppler as  needed of all accessible portions of each vessel. Bilateral testing is considered an integral part of a complete examination. Limited examinations for reoccurring indications may be performed as noted.  Right Carotid Findings: +----------+--------+--------+--------+------------------+--------------+             PSV cm/s EDV cm/s Stenosis Plaque Description Comments        +----------+--------+--------+--------+------------------+--------------+  CCA Prox   174      13                                                   +----------+--------+--------+--------+------------------+--------------+  CCA Distal 81       12                                                   +----------+--------+--------+--------+------------------+--------------+  ICA Prox   56       8                 smooth             minimal plaque  +----------+--------+--------+--------+------------------+--------------+  ICA Mid    70       15       1-39%                                       +----------+--------+--------+--------+------------------+--------------+  ICA Distal 67       18                                                   +----------+--------+--------+--------+------------------+--------------+  ECA        121      1                                                    +----------+--------+--------+--------+------------------+--------------+ +----------+--------+-------+----------------+-------------------+             PSV cm/s EDV cms Describe         Arm Pressure (mmHG)  +----------+--------+-------+----------------+-------------------+  Subclavian 208              Multiphasic, WNL 122                  +----------+--------+-------+----------------+-------------------+ +---------+--------+--+--------+-+---------+  Vertebral PSV cm/s 52 EDV  cm/s 9 Antegrade  +---------+--------+--+--------+-+---------+  Left Carotid Findings: +----------+--------+--------+--------+----------------------+--------+             PSV cm/s EDV cm/s Stenosis Plaque Description     Comments  +----------+--------+--------+--------+----------------------+--------+  CCA Prox   106      13                                                 +----------+--------+--------+--------+----------------------+--------+  CCA Distal 84  16                                                 +----------+--------+--------+--------+----------------------+--------+  ICA Prox   72       13       1-39%    focal and heterogenous           +----------+--------+--------+--------+----------------------+--------+  ICA Mid    71       19                                                 +----------+--------+--------+--------+----------------------+--------+  ICA Distal 45       14                                                 +----------+--------+--------+--------+----------------------+--------+  ECA        139      11                                                 +----------+--------+--------+--------+----------------------+--------+ +----------+--------+--------+----------------+-------------------+             PSV cm/s EDV cm/s Describe         Arm Pressure (mmHG)  +----------+--------+--------+----------------+-------------------+  Subclavian 120               Multiphasic, WNL 122                  +----------+--------+--------+----------------+-------------------+ +---------+--------+--+--------+--+---------+  Vertebral PSV cm/s 52 EDV cm/s 11 Antegrade  +---------+--------+--+--------+--+---------+  Summary: Right Carotid: Velocities in the right ICA are consistent with a 1-39% stenosis. Left Carotid: Velocities in the left ICA are consistent with a 1-39% stenosis. Vertebrals:  Bilateral vertebral arteries demonstrate antegrade flow. Subclavians: Normal flow hemodynamics were seen in bilateral  subclavian              arteries. *See table(s) above for measurements and observations.  Electronically signed by Ena Dawley MD on 03/15/2019 at 10:28:31 AM.    Final     Assessment & Plan:   Problem List Items Addressed This Visit     HYPERTENSION, BENIGN    Continue on metoprolol and losartan      It band syndrome, left    Start PT Ice Rice sock TENS unit Spiky ball      Relevant Orders   Ambulatory referral to Physical Therapy   Trochanteric bursitis of left hip    Start PT Ice Rice sock TENS unit Spiky ball      Relevant Orders   Ambulatory referral to Physical Therapy      Meds ordered this encounter  Medications   diclofenac Sodium (VOLTAREN) 1 % GEL    Sig: Apply 1 application topically 4 (four) times daily.    Dispense:  100 g    Refill:  3   traMADol (ULTRAM) 50 MG tablet    Sig: Take 1 tablet (50 mg total) by mouth every  6 (six) hours as needed for up to 5 days.    Dispense:  20 tablet    Refill:  1      Follow-up: No follow-ups on file.  Walker Kehr, MD

## 2021-04-16 NOTE — Patient Instructions (Signed)
Hip Bursitis Hip bursitis is the inflammation of one or more bursae in the hip joint. Bursae are small fluid-filled sacs that absorb shock and prevent bones from rubbing against each other. Hip bursitis can cause mild to moderate pain, and symptoms often come and go over time. What are the causes? This condition results from increased friction between the hip bones and the tendons around the hip joint. This condition can happen if you: Overuse your hip muscles. Injure your hip. Have weak buttocks muscles. Have bone spurs. Have an infection. In some cases, the cause may not be known. What increases the risk? You are more likely to develop this condition if: You injured your hip previously or had hip surgery. You have a medical condition, such as arthritis, gout, diabetes, or thyroid disease. You have spine problems. You have one leg that is shorter than the other. You participate in athletic activities that include repetitive motion, like running. You participate in sports where there is a risk of injury or falling, such as football, martial arts, or skiing. What are the signs or symptoms? Symptoms may come and go, and they often include: Pain in the hip or groin area. Pain may get worse with movement. Tenderness and swelling of the hip. In rare cases, the bursa may become infected. If this happens, you may get a fever, as well as warmth and redness in the hip area. How is this diagnosed? This condition may be diagnosed based on: Your symptoms. Your medical history. A physical exam. Imaging tests, such as: X-rays to check your bones. MRI or ultrasound to check your tendons and muscles. Bone scan. A biopsy to remove fluid from your inflamed bursa for testing. How is this treated? This condition is treated by resting, icing, applying pressure (compression), and raising (elevating) the injured area. This is called RICE treatment. In some cases, RICE treatment may not be enough to make  your symptoms go away. Treatment may also include: Taking medicine to help with swelling and pain. Using crutches, a cane, or a walker to decrease the strain on your hip. Getting a shot of cortisone medicine to help reduce swelling. Taking other medicines if the bursa is infected. Draining fluid out of the bursa to help relieve swelling. Having surgery to remove a damaged or infected bursa. This is rare. Long-term treatment may include: Physical therapy exercises for strength and flexibility. Lifestyle changes, such as weight loss, to reduce the strain on the hip. Follow these instructions at home: Managing pain, stiffness, and swelling   If directed, put ice on the painful area. Put ice in a plastic bag. Place a towel between your skin and the bag. Leave the ice on for 20 minutes, 2-3 times a day. Raise (elevate) your hip as much as you can without pain. To do this, put a pillow under your hips while you lie down. If directed, apply heat to the affected area as often as told by your health care provider. Use the heat source that your health care provider recommends, such as a moist heat pack or a heating pad. Place a towel between your skin and the heat source. Leave the heat on for 20-30 minutes. Remove the heat if your skin turns bright red. This is especially important if you are unable to feel pain, heat, or cold. You may have a greater risk of getting burned. Activity Do not use your hip to support your body weight until your health care provider says that you can. Use crutches,  a cane, or a walker as told by your health care provider. If the affected leg is one that you use to drive, ask your health care provider if it is safe to drive. Rest and protect your hip as much as possible until your pain and swelling get better. Return to your normal activities as told by your health care provider. Ask your health care provider what activities are safe for you. Do exercises as told by your  health care provider. General instructions Take over-the-counter and prescription medicines only as told by your health care provider. Gently massage and stretch your injured area as often as is comfortable. Wear compression wraps only as told by your health care provider. If one of your legs is shorter than the other, get fitted for a shoe insert or orthotic. Maintain a healthy weight. Follow instructions from your health care provider for weight control. These may include dietary restrictions. Keep all follow-up visits as told by your health care provider. This is important. How is this prevented? Exercise regularly, as told by your health care provider. Wear supportive footwear that is appropriate for your sport. Warm up and stretch before being active. Cool down and stretch after being active. Take breaks regularly from repetitive activity. If an activity irritates your hip or causes pain, avoid the activity as much as possible. Avoid sitting down for long periods at a time. Where to find more information American Academy [employer]: orthoinfo.aaos.org Contact a health care provider if: You have a fever. You develop new symptoms. You have trouble walking or doing everyday activities. You have pain that gets worse or does not get better with medicine. You develop red skin or a feeling of warmth in your hip area. Get help right away if: You cannot move your hip. You have severe pain. You cannot control the muscles in your feet. Summary Hip bursitis is the inflammation of one or more bursae in the hip joint. Bursae are small fluid-filled sacs that absorb shock and prevent bones from rubbing against each other. Hip bursitis can cause hip or groin pain, and symptoms often come and go over time. This condition is often treated by resting, icing, applying pressure (compression), and raising (elevating) the injured area. Other treatments may be needed. This information is not  intended to replace advice given to you by your health care provider. Make sure you discuss any questions you have with your health care provider. Document Revised: 02/12/2019 Document Reviewed: 12/19/2017 Elsevier Patient Education  S[employer]ier Rice sock TENS unit Spiky ball

## 2021-04-27 ENCOUNTER — Encounter: Payer: Self-pay | Admitting: Internal Medicine

## 2021-04-27 NOTE — Assessment & Plan Note (Signed)
Continue on metoprolol and losartan

## 2021-05-05 ENCOUNTER — Other Ambulatory Visit: Payer: Self-pay | Admitting: Internal Medicine

## 2021-05-05 ENCOUNTER — Other Ambulatory Visit: Payer: Self-pay | Admitting: Cardiovascular Disease

## 2021-05-06 ENCOUNTER — Other Ambulatory Visit: Payer: Self-pay | Admitting: Cardiovascular Disease

## 2021-05-29 ENCOUNTER — Other Ambulatory Visit: Payer: Self-pay | Admitting: Cardiovascular Disease

## 2021-06-10 ENCOUNTER — Other Ambulatory Visit: Payer: Self-pay | Admitting: Internal Medicine

## 2021-06-11 ENCOUNTER — Other Ambulatory Visit: Payer: Self-pay | Admitting: Cardiovascular Disease

## 2021-06-15 ENCOUNTER — Other Ambulatory Visit: Payer: Self-pay | Admitting: Cardiovascular Disease

## 2021-06-19 ENCOUNTER — Other Ambulatory Visit: Payer: Self-pay | Admitting: Cardiovascular Disease

## 2021-06-19 NOTE — Progress Notes (Signed)
Date:  06/23/2021   ID:  [name] [name], DOB [birth year], MRN [id number]  PCP:  Cassandria Anger, MD  Cardiologist:   Johnsie Cancel Electrophysiologist:  None   Evaluation Performed:  Follow-Up Visit  Chief Complaint:  CAD  History of Present Illness:    81 y.o. with CAD. Known CTO RCA, Previous cutting balloon D1 latter occluded in 2008. Last cath 2011 with 70% mid LAD Rx medically by Dr Olevia Perches.  Last myovue 12/11/19 old IMI no ischemia EF preserved 68% no change from July 2018 . Does taxes Active with no angina One son in [place] priest Another son a Chief Executive Officer in [place].   No angina or claudication. Son who is a priest getting an assignment in [place] with cloistered nuns  Weight is down No angina. 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  Has had left hip pain and IT band syndrome referred to PT  No chest pains walking a lot and doing exercises for his hip    Past Medical History:  Diagnosis Date   Allergic rhinitis    Allergy    Arthritis    in neck   CAD (coronary artery disease)    Cataract    Had surg 08/2016   CERUMEN IMPACTION 06/04/2009   5/15     Colon polyp    Diverticulosis    Family hx of colon cancer    brother   Hyperlipemia    Hypertension    Hypothyroidism    MI (myocardial infarction) ([place]) 1993/2007   no stents   OSA (obstructive sleep apnea)    Psoriasis    Sleep apnea    no cpap- had sleep study per pt no OSA   Past Surgical History:  Procedure Laterality Date   CARDIAC CATHETERIZATION  1993   COLONOSCOPY     POLYPECTOMY     PTCA     TONSILLECTOMY          Allergies:   Patient has no known allergies.   Social History   Tobacco Use   Smoking status: Former    Types: Cigarettes    Quit date: 04/1978    Years since quitting: 43.1   Smokeless tobacco: Never  Vaping Use   Vaping Use: Never used  Substance Use Topics   Alcohol use: Yes    Alcohol/week: 6.0 standard drinks    Types: 6 Cans of  beer per week   Drug use: No     Family Hx: The patient's family history includes Bone cancer in his brother; COPD in his father; Cancer in his brother; Colon cancer in his brother, maternal aunt, maternal aunt, maternal uncle, paternal aunt, paternal grandfather, and another family member; Colon polyps in his sister; Coronary artery disease in his mother; Heart disease in his mother; Hypertension in an other family member. There is no history of Esophageal cancer, Rectal cancer, or Stomach cancer.  ROS:   Please see the history of present illness.     All other systems reviewed and are negative.   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  06/23/2021 rate 62   Recent Labs: 07/04/2020: BUN 15; Creatinine, Ser 1.08; Potassium 4.2; Sodium 138; TSH 6.66   Recent Lipid Panel Lab Results  Component Value Date/Time   CHOL 127 04/01/2020 01:52 PM  TRIG 51.0 04/01/2020 01:52 PM   TRIG 52 03/21/2006 08:20 AM   HDL 54.20 04/01/2020 01:52 PM   CHOLHDL 2 04/01/2020 01:52 PM   LDLCALC 62 04/01/2020 01:52 PM    Wt Readings from Last 3 Encounters:  06/23/21 209 lb (94.8 kg)  04/16/21 213 lb 12.8 oz (97 kg)  09/11/20 208 lb 9.6 oz (94.6 kg)     Objective:    Vital Signs:  BP (!) 150/82    Pulse 62    Ht 6\' 1"  (1.854 m)    Wt 209 lb (94.8 kg)    SpO2 99%    BMI 27.57 kg/m    Skin warm and dry No distress No tachypnea No JVP elevation  Neuro appears non focal No edema  Telephone visit no exam   ASSESSMENT & PLAN:    CAD: Occluded D1 and RCA with moderate LAD disease non ischemic myovue 12/11/19  Continue medical RX  Very active with no angina    HTN: Well controlled.  Continue current medications and low sodium Dash type diet.     Thyroid  Continue replacement TSH normal 4.3 on 01/10/18   HLD:  Continue meds LDL at goal 60 on 56 on 04/01/20    PVD:   Calcium seen on lumbar film f/u US with >50% left iliac disease no claudication observe Carotids plaque no stenosis on [place] 03/15/19   COVID-19 Education: The signs and symptoms of COVID-19 were discussed with the patient and how to seek care for testing (follow up with PCP or arrange E-visit).  The importance of social distancing was discussed today.  Time:   Today, I have spent 30 minutes with the patient with telehealth technology discussing the above problems.  This includes chart review , stress test, lab review and composing note    Medication Adjustments/Labs and Tests Ordered: Current medicines are reviewed at length with the patient today.  Concerns regarding medicines are outlined above.   Tests Ordered:  None   Medication Changes:  None   Disposition:  Follow up in a year   Signed, Jenkins Rouge, MD  06/23/2021 10:57 AM    [place]

## 2021-06-20 ENCOUNTER — Other Ambulatory Visit: Payer: Self-pay | Admitting: Internal Medicine

## 2021-06-23 ENCOUNTER — Ambulatory Visit (INDEPENDENT_AMBULATORY_CARE_PROVIDER_SITE_OTHER): Payer: Medicare Other | Admitting: Cardiovascular Disease

## 2021-06-23 ENCOUNTER — Encounter: Payer: Self-pay | Admitting: Cardiovascular Disease

## 2021-06-23 ENCOUNTER — Other Ambulatory Visit: Payer: Self-pay

## 2021-06-23 VITALS — BP 150/82 | HR 62 | Ht 73.0 in | Wt 209.0 lb

## 2021-06-23 DIAGNOSIS — E782 Mixed hyperlipidemia: Secondary | ICD-10-CM

## 2021-06-23 DIAGNOSIS — I10 Essential (primary) hypertension: Secondary | ICD-10-CM | POA: Diagnosis not present

## 2021-06-23 DIAGNOSIS — I2510 Atherosclerotic heart disease of native coronary artery without angina pectoris: Secondary | ICD-10-CM

## 2021-06-23 NOTE — Patient Instructions (Signed)
Medication Instructions:  Your physician recommends that you continue on your current medications as directed. Please refer to the Current Medication list given to you today.  *If you need a refill on your cardiac medications before your next appointment, please call your pharmacy*  Lab Work: If you have labs (blood work) drawn today and your tests are completely normal, you will receive your results only by: [place] (if you have MyChart) OR A paper copy in the mail If you have any lab test that is abnormal or we need to change your treatment, we will call you to review the results.  Follow-Up: At [facility], you and your health needs are our priority.  As part of our continuing mission to provide you with exceptional heart care, we have created designated Provider Care Teams.  These Care Teams include your primary Cardiologist (physician) and Advanced Practice Providers (APPs -  Physician Assistants and Nurse Practitioners) who all work together to provide you with the care you need, when you need it.  We recommend signing up for the patient portal called "MyChart".  Sign up information is provided on this After Visit Summary.  MyChart is used to connect with patients for Virtual Visits (Telemedicine).  Patients are able to view lab/test results, encounter notes, upcoming appointments, etc.  Non-urgent messages can be sent to your provider as well.   To learn more about what you can do with MyChart, go to [email or web address].    Your next appointment:   1 year(s)  The format for your next appointment:   In Person  Provider:   Jenkins Rouge, MD {

## 2021-07-02 ENCOUNTER — Other Ambulatory Visit: Payer: Self-pay

## 2021-07-02 ENCOUNTER — Encounter: Payer: Self-pay | Admitting: Internal Medicine

## 2021-07-02 ENCOUNTER — Ambulatory Visit (INDEPENDENT_AMBULATORY_CARE_PROVIDER_SITE_OTHER): Payer: Medicare Other | Admitting: Internal Medicine

## 2021-07-02 VITALS — BP 160/80 | HR 66 | Temp 97.8°F | Ht 73.0 in | Wt 202.0 lb

## 2021-07-02 DIAGNOSIS — Z86010 Personal history of colonic polyps: Secondary | ICD-10-CM

## 2021-07-02 DIAGNOSIS — D649 Anemia, unspecified: Secondary | ICD-10-CM

## 2021-07-02 DIAGNOSIS — L408 Other psoriasis: Secondary | ICD-10-CM

## 2021-07-02 DIAGNOSIS — I10 Essential (primary) hypertension: Secondary | ICD-10-CM

## 2021-07-02 DIAGNOSIS — M7062 Trochanteric bursitis, left hip: Secondary | ICD-10-CM | POA: Diagnosis not present

## 2021-07-02 DIAGNOSIS — H6122 Impacted cerumen, left ear: Secondary | ICD-10-CM | POA: Diagnosis not present

## 2021-07-02 DIAGNOSIS — E034 Atrophy of thyroid (acquired): Secondary | ICD-10-CM | POA: Diagnosis not present

## 2021-07-02 DIAGNOSIS — Z0000 Encounter for general adult medical examination without abnormal findings: Secondary | ICD-10-CM

## 2021-07-02 DIAGNOSIS — M7632 Iliotibial band syndrome, left leg: Secondary | ICD-10-CM | POA: Diagnosis not present

## 2021-07-02 LAB — URINALYSIS
Bilirubin Urine: NEGATIVE
Hgb urine dipstick: NEGATIVE
Ketones, ur: NEGATIVE
Leukocytes,Ua: NEGATIVE
Nitrite: NEGATIVE
Specific Gravity, Urine: 1.015 (ref 1.000–1.030)
Total Protein, Urine: NEGATIVE
Urine Glucose: NEGATIVE
Urobilinogen, UA: 0.2 (ref 0.0–1.0)
pH: 6 (ref 5.0–8.0)

## 2021-07-02 LAB — PSA: PSA: 0.52 ng/mL (ref 0.10–4.00)

## 2021-07-02 LAB — CBC WITH DIFFERENTIAL/PLATELET
Basophils Absolute: 0.1 10*3/uL (ref 0.0–0.1)
Basophils Relative: 0.8 % (ref 0.0–3.0)
Eosinophils Absolute: 0.2 10*3/uL (ref 0.0–0.7)
Eosinophils Relative: 3.3 % (ref 0.0–5.0)
HCT: 36.5 % — ABNORMAL LOW (ref 39.0–52.0)
Hemoglobin: 11.5 g/dL — ABNORMAL LOW (ref 13.0–17.0)
Lymphocytes Relative: 29.4 % (ref 12.0–46.0)
Lymphs Abs: 1.8 10*3/uL (ref 0.7–4.0)
MCHC: 31.6 g/dL (ref 30.0–36.0)
MCV: 80.8 fl (ref 78.0–100.0)
Monocytes Absolute: 0.6 10*3/uL (ref 0.1–1.0)
Monocytes Relative: 10.3 % (ref 3.0–12.0)
Neutro Abs: 3.4 10*3/uL (ref 1.4–7.7)
Neutrophils Relative %: 56.2 % (ref 43.0–77.0)
Platelets: 266 10*3/uL (ref 150.0–400.0)
RBC: 4.52 Mil/uL (ref 4.22–5.81)
RDW: 15.3 % (ref 11.5–15.5)
WBC: 6.1 10*3/uL (ref 4.0–10.5)

## 2021-07-02 LAB — COMPREHENSIVE METABOLIC PANEL
ALT: 21 U/L (ref 0–53)
AST: 26 U/L (ref 0–37)
Albumin: 4.4 g/dL (ref 3.5–5.2)
Alkaline Phosphatase: 45 U/L (ref 39–117)
BUN: 13 mg/dL (ref 6–23)
CO2: 24 mEq/L (ref 19–32)
Calcium: 9.4 mg/dL (ref 8.4–10.5)
Chloride: 103 mEq/L (ref 96–112)
Creatinine, Ser: 1.07 mg/dL (ref 0.40–1.50)
GFR: 65.67 mL/min (ref >60.00)
Glucose, Bld: 94 mg/dL (ref 70–99)
Potassium: 4 mEq/L (ref 3.5–5.1)
Sodium: 136 mEq/L (ref 135–145)
Total Bilirubin: 1 mg/dL (ref 0.2–1.2)
Total Protein: 6.7 g/dL (ref 6.0–8.3)

## 2021-07-02 LAB — LIPID PANEL
Cholesterol: 129 mg/dL (ref 0–200)
HDL: 50.1 mg/dL (ref >39.00)
LDL Cholesterol: 61 mg/dL (ref 0–99)
NonHDL: 78.76
Total CHOL/HDL Ratio: 3
Triglycerides: 88 mg/dL (ref 0.0–149.0)
VLDL: 17.6 mg/dL (ref 0.0–40.0)

## 2021-07-02 LAB — TSH: TSH: 2.37 u[IU]/mL (ref 0.35–5.50)

## 2021-07-02 MED ORDER — LEVOTHYROXINE SODIUM 100 MCG PO TABS: ORAL_TABLET | ORAL | 0 refills | Status: DC

## 2021-07-02 MED ORDER — SIMVASTATIN 40 MG PO TABS: ORAL_TABLET | ORAL | 3 refills | Status: DC

## 2021-07-02 MED ORDER — NIACIN ER 500 MG PO CPCR: 500.0000 mg | ORAL_CAPSULE | Freq: Every day | ORAL | 3 refills | Status: DC

## 2021-07-02 MED ORDER — NIACIN ER 500 MG PO CPCR: 1000.0000 mg | ORAL_CAPSULE | Freq: Every day | ORAL | 3 refills | Status: AC

## 2021-07-02 MED ORDER — PANTOPRAZOLE SODIUM 40 MG PO TBEC: 40.0000 mg | DELAYED_RELEASE_TABLET | Freq: Every morning | ORAL | 0 refills | Status: DC

## 2021-07-02 MED ORDER — NEOMYCIN-POLYMYXIN-HC 3.5-10000-1 OT SOLN: 3.0000 [drp] | Freq: Three times a day (TID) | OTIC | 3 refills | Status: AC

## 2021-07-02 MED ORDER — LOSARTAN POTASSIUM 100 MG PO TABS
100.0000 mg | ORAL_TABLET | Freq: Every day | ORAL | 3 refills | Status: DC
Start: 2021-07-02 — End: 2022-08-09

## 2021-07-02 MED ORDER — CLOPIDOGREL BISULFATE 75 MG PO TABS: ORAL_TABLET | ORAL | 0 refills | Status: DC

## 2021-07-02 MED ORDER — METOPROLOL SUCCINATE ER 25 MG PO TB24: 25.0000 mg | ORAL_TABLET | Freq: Every day | ORAL | 3 refills | Status: DC

## 2021-07-02 NOTE — Assessment & Plan Note (Signed)
Better now by 80% ?

## 2021-07-02 NOTE — Assessment & Plan Note (Signed)
Phil will irrigate ear at home ?

## 2021-07-02 NOTE — Assessment & Plan Note (Signed)
Cortisporin gtt  for ears prn ?

## 2021-07-02 NOTE — Progress Notes (Signed)
Subjective:  Patient ID: [name] [name], adult    DOB: [birth year]  Age: 81 y.o. MRN: [id number]  CC: No chief complaint on file.   HPI AIDON KLEMENS presents for a well exam  Outpatient Medications Prior to Visit  Medication Sig Dispense Refill   acetaminophen (TYLENOL) 500 MG tablet Take 500 mg by mouth every 6 (six) hours as needed.     aspirin 81 MG tablet Take 81 mg by mouth daily.     azelastine (OPTIVAR) 0.05 % ophthalmic solution Place 1 drop into both eyes 2 (two) times daily. 6 mL 6   cholecalciferol (VITAMIN D) 1000 UNITS tablet Take 4,000 Units by mouth daily.      diclofenac Sodium (VOLTAREN) 1 % GEL Apply 1 application topically 4 (four) times daily. 100 g 3   halobetasol (ULTRAVATE) 0.05 % cream Apply topically 2 (two) times daily. 50 g 2   Multiple Vitamins-Minerals (CENTRUM SILVER PO) Take 1 tablet by mouth daily.     nitroGLYCERIN (NITROSTAT) 0.4 MG SL tablet Place 1 tablet (0.4 mg total) under the tongue every 5 (five) minutes as needed for chest pain (3 doses MAX). 25 tablet 4   Omega-3 Fatty Acids (FISH OIL) 1200 MG CAPS Take 1 capsule by mouth daily.     potassium chloride (KLOR-CON) 10 MEQ tablet TAKE 1 TABLET BY MOUTH EVERY DAY 90 tablet 3   clopidogrel (PLAVIX) 75 MG tablet TAKE 1 TABLET BY MOUTH DAILY. *PLZ SCHEDULE APPT FOR REFILLS* 90 tablet 0   levothyroxine (SYNTHROID) 100 MCG tablet TAKE 1 TABLET BY MOUTH EVERY DAY ANNUAL APPT DUE IN DEC MUST SEE PROVIDER FOR FUTURE REFILLS 90 tablet 0   losartan (COZAAR) 100 MG tablet TAKE 1 TABLET BY MOUTH EVERY DAY 90 tablet 3   metoprolol succinate (TOPROL-XL) 25 MG 24 hr tablet TAKE 1 TABLET BY MOUTH EVERY DAY 90 tablet 3   niacin (NIASPAN) 1000 MG CR tablet Take 2 tablets (2,000 mg total) by mouth at bedtime. Please make overdue appt with Dr Johnsie Cancel before anymore refills. Thank you 1st attempt 60 tablet 0   pantoprazole (PROTONIX) 40 MG tablet TAKE 1 TABLET (40 MG TOTAL) BY MOUTH IN THE MORNING. MUST HAVE OFFICE  VISIT FOR FURTHER REFILLS 90 tablet 0   simvastatin (ZOCOR) 40 MG tablet TAKE 1 TABLET BY MOUTH DAILY. ANNUAL APPT DUE IN DECEMBER MUST SEE PROVIDER FOR FUTURE REFILLS! 90 tablet 3   No facility-administered medications prior to visit.    ROS: Review of Systems  Constitutional:  Negative for activity change, appetite change, chills, fatigue and unexpected weight change.  HENT:  Negative for congestion, mouth sores and sinus pressure.   Eyes:  Negative for visual disturbance.  Respiratory:  Negative for cough and chest tightness.   Gastrointestinal:  Negative for abdominal pain and nausea.  Genitourinary:  Negative for difficulty urinating, frequency and vaginal pain.  Musculoskeletal:  Negative for back pain and gait problem.  Skin:  Negative for pallor and rash.  Neurological:  Negative for dizziness, tremors, weakness, numbness and headaches.  Psychiatric/Behavioral:  Negative for confusion and sleep disturbance.    Objective:  BP (!) 160/80 (BP Location: Left Arm, Patient Position: Sitting, Cuff Size: Large)    Pulse 66    Temp 97.8 F (36.6 C) (Oral)    Ht '6\' 1"'$  (1.854 m)    Wt 202 lb (91.6 kg)    SpO2 95%    BMI 26.65 kg/m   BP Readings from Last 3 Encounters:  07/02/21 (!) 160/80  06/23/21 (!) 150/82  04/16/21 134/70    Wt Readings from Last 3 Encounters:  07/02/21 202 lb (91.6 kg)  06/23/21 209 lb (94.8 kg)  04/16/21 213 lb 12.8 oz (97 kg)    Physical Exam Constitutional:      General: He is not in acute distress.    Appearance: He is well-developed.     Comments: NAD  Eyes:     Conjunctiva/sclera: Conjunctivae normal.     Pupils: Pupils are equal, round, and reactive to light.  Neck:     Thyroid: No thyromegaly.     Vascular: No JVD.  Cardiovascular:     Rate and Rhythm: Normal rate and regular rhythm.     Heart sounds: Normal heart sounds. No murmur heard.   No friction rub. No gallop.  Pulmonary:     Effort: Pulmonary effort is normal. No respiratory  distress.     Breath sounds: Normal breath sounds. No wheezing or rales.  Chest:     Chest wall: No tenderness.  Abdominal:     General: Bowel sounds are normal. There is no distension.     Palpations: Abdomen is soft. There is no mass.     Tenderness: There is no abdominal tenderness. There is no guarding or rebound.  Musculoskeletal:        General: No tenderness. Normal range of motion.     Cervical back: Normal range of motion.  Lymphadenopathy:     Cervical: No cervical adenopathy.  Skin:    General: Skin is warm and dry.     Findings: No rash.  Neurological:     Mental Status: He is alert and oriented to person, place, and time.     Cranial Nerves: No cranial nerve deficit.     Motor: No abnormal muscle tone.     Coordination: Coordination normal.     Gait: Gait normal.     Deep Tendon Reflexes: Reflexes are normal and symmetric.  Psychiatric:        Behavior: Behavior normal.        Thought Content: Thought content normal.        Judgment: Judgment normal.  Wax - L ear Rectal - per Dr Hilarie Fredrickson  Lab Results  Component Value Date   WBC 8.0 04/01/2020   HGB 13.0 04/01/2020   HCT 39.5 04/01/2020   PLT 246.0 04/01/2020   GLUCOSE 86 07/04/2020   CHOL 127 04/01/2020   TRIG 51.0 04/01/2020   HDL 54.20 04/01/2020   LDLCALC 62 04/01/2020   ALT 24 04/01/2020   AST 25 04/01/2020   NA 138 07/04/2020   K 4.2 07/04/2020   CL 103 07/04/2020   CREATININE 1.08 07/04/2020   BUN 15 07/04/2020   CO2 25 07/04/2020   TSH 6.66 (H) 07/04/2020   PSA 1.06 04/01/2020   INR 1.0 ratio 03/06/2010   HGBA1C 5.8 08/28/2013    VAS US CAROTID  Result Date: 03/15/2019 Carotid Arterial Duplex Study Indications:  Bilateral bruits. Risk Factors: Hypertension, past history of smoking, prior MI, coronary artery               disease. Performing Technologist: Mariane Masters RVT  Examination Guidelines: A complete evaluation includes B-mode imaging, spectral Doppler, color Doppler, and power  Doppler as needed of all accessible portions of each vessel. Bilateral testing is considered an integral part of a complete examination. Limited examinations for reoccurring indications may be performed as noted.  Right Carotid Findings: +----------+--------+--------+--------+------------------+--------------+  PSV cm/s EDV cm/s Stenosis Plaque Description Comments        +----------+--------+--------+--------+------------------+--------------+  CCA Prox   174      13                                                   +----------+--------+--------+--------+------------------+--------------+  CCA Distal 81       12                                                   +----------+--------+--------+--------+------------------+--------------+  ICA Prox   56       8                 smooth             minimal plaque  +----------+--------+--------+--------+------------------+--------------+  ICA Mid    70       15       1-39%                                       +----------+--------+--------+--------+------------------+--------------+  ICA Distal 67       18                                                   +----------+--------+--------+--------+------------------+--------------+  ECA        121      1                                                    +----------+--------+--------+--------+------------------+--------------+ +----------+--------+-------+----------------+-------------------+             PSV cm/s EDV cms Describe         Arm Pressure (mmHG)  +----------+--------+-------+----------------+-------------------+  Subclavian 208              Multiphasic, WNL 122                  +----------+--------+-------+----------------+-------------------+ +---------+--------+--+--------+-+---------+  Vertebral PSV cm/s 52 EDV cm/s 9 Antegrade  +---------+--------+--+--------+-+---------+  Left Carotid Findings: +----------+--------+--------+--------+----------------------+--------+             PSV cm/s EDV  cm/s Stenosis Plaque Description     Comments  +----------+--------+--------+--------+----------------------+--------+  CCA Prox   106      13                                                 +----------+--------+--------+--------+----------------------+--------+  CCA Distal 84       16                                                 +----------+--------+--------+--------+----------------------+--------+  ICA Prox   72       13       1-39%    focal and heterogenous           +----------+--------+--------+--------+----------------------+--------+  ICA Mid    71       19                                                 +----------+--------+--------+--------+----------------------+--------+  ICA Distal 45       14                                                 +----------+--------+--------+--------+----------------------+--------+  ECA        139      11                                                 +----------+--------+--------+--------+----------------------+--------+ +----------+--------+--------+----------------+-------------------+             PSV cm/s EDV cm/s Describe         Arm Pressure (mmHG)  +----------+--------+--------+----------------+-------------------+  Subclavian 120               Multiphasic, WNL 122                  +----------+--------+--------+----------------+-------------------+ +---------+--------+--+--------+--+---------+  Vertebral PSV cm/s 52 EDV cm/s 11 Antegrade  +---------+--------+--+--------+--+---------+  Summary: Right Carotid: Velocities in the right ICA are consistent with a 1-39% stenosis. Left Carotid: Velocities in the left ICA are consistent with a 1-39% stenosis. Vertebrals:  Bilateral vertebral arteries demonstrate antegrade flow. Subclavians: Normal flow hemodynamics were seen in bilateral subclavian              arteries. *See table(s) above for measurements and observations.  Electronically signed by Ena Dawley MD on 03/15/2019 at 10:28:31 AM.    Final      Assessment & Plan:   Problem List Items Addressed This Visit     Cerumen impaction    Phil will irrigate ear at home      History of colon polyps    Phil will f/u w/Dr Pyrtle      HYPERTENSION, BENIGN    Continue on metoprolol, Losartan      Relevant Medications   losartan (COZAAR) 100 MG tablet   metoprolol succinate (TOPROL-XL) 25 MG 24 hr tablet   simvastatin (ZOCOR) 40 MG tablet   Other Relevant Orders   CBC with Differential/Platelet   Hypothyroidism    On Levothyroxine      Relevant Medications   levothyroxine (SYNTHROID) 100 MCG tablet   metoprolol succinate (TOPROL-XL) 25 MG 24 hr tablet   Other Relevant Orders   TSH   CBC with Differential/Platelet   It band syndrome, left    Better now by 80%      Relevant Orders   CBC with Differential/Platelet   PSORIASIS NEC    Cortisporin gtt  for ears prn      Trochanteric bursitis of left hip    Resolved       Well adult exam - Primary  We discussed age appropriate health related issues, including available/recomended screening tests and vaccinations. Labs were ordered to be later reviewed . All questions were answered. We discussed one or more of the following - seat belt use, use of sunscreen/sun exposure exercise, fall risk reduction, second hand smoke exposure, firearm use and storage, seat belt use, a need for adhering to healthy diet and exercise. Labs were ordered.  All questions were answered.          Relevant Orders   TSH   Urinalysis   CBC with Differential/Platelet   Lipid panel   PSA   Comprehensive metabolic panel      Meds ordered this encounter  Medications   clopidogrel (PLAVIX) 75 MG tablet    Sig: TAKE 1 TABLET BY MOUTH DAILY    Dispense:  90 tablet    Refill:  0    MUST KEEP APPT IN MAY FOR FURTHER REFILLS   levothyroxine (SYNTHROID) 100 MCG tablet    Sig: TAKE 1 TABLET BY MOUTH EVERY DAY    Dispense:  90 tablet    Refill:  0   losartan (COZAAR) 100 MG tablet     Sig: Take 1 tablet (100 mg total) by mouth daily.    Dispense:  90 tablet    Refill:  3   metoprolol succinate (TOPROL-XL) 25 MG 24 hr tablet    Sig: Take 1 tablet (25 mg total) by mouth daily.    Dispense:  90 tablet    Refill:  3   pantoprazole (PROTONIX) 40 MG tablet    Sig: Take 1 tablet (40 mg total) by mouth in the morning.    Dispense:  90 tablet    Refill:  0   simvastatin (ZOCOR) 40 MG tablet    Sig: TAKE 1 TABLET BY MOUTH DAILY    Dispense:  90 tablet    Refill:  3   DISCONTD: niacin 500 MG CR capsule    Sig: Take 1 capsule (500 mg total) by mouth at bedtime.    Dispense:  100 capsule    Refill:  3   niacin 500 MG CR capsule    Sig: Take 2 capsules (1,000 mg total) by mouth at bedtime.    Dispense:  100 capsule    Refill:  3   neomycin-polymyxin-hydrocortisone (CORTISPORIN) OTIC solution    Sig: Place 3 drops into both ears 3 (three) times daily.    Dispense:  10 mL    Refill:  3      Follow-up: No follow-ups on file.  Walker Kehr, MD

## 2021-07-02 NOTE — Assessment & Plan Note (Addendum)

## 2021-07-02 NOTE — Assessment & Plan Note (Signed)
Abbe Amsterdam will f/u w/Dr Pyrtle ?

## 2021-07-02 NOTE — Assessment & Plan Note (Signed)
On Levothyroxine 

## 2021-07-02 NOTE — Assessment & Plan Note (Signed)
Resolved

## 2021-07-02 NOTE — Assessment & Plan Note (Signed)
Continue on metoprolol, Losartan ?

## 2021-07-03 DIAGNOSIS — D649 Anemia, unspecified: Secondary | ICD-10-CM | POA: Insufficient documentation

## 2021-07-03 NOTE — Addendum Note (Signed)
Addended by: Cassandria Anger on: 07/03/2021 10:27 AM ? ? Modules accepted: Orders ? ?

## 2021-07-03 NOTE — Assessment & Plan Note (Signed)
Hemoglobin is 11.5 compared to 13.0 one year ago.  I would like you to repeat  blood count and iron test in 4 weeks.  I will make a referral  To Dr. Ardis Hughs to rule out gastrointestinal blood loss.  ?

## 2021-07-05 ENCOUNTER — Other Ambulatory Visit: Payer: Self-pay | Admitting: Internal Medicine

## 2021-07-15 ENCOUNTER — Encounter: Payer: Self-pay | Admitting: Internal Medicine

## 2021-07-15 ENCOUNTER — Telehealth (INDEPENDENT_AMBULATORY_CARE_PROVIDER_SITE_OTHER): Payer: Medicare Other | Admitting: Internal Medicine

## 2021-07-15 ENCOUNTER — Other Ambulatory Visit: Payer: Self-pay

## 2021-07-15 VITALS — Temp 100.0°F | Wt 196.0 lb

## 2021-07-15 DIAGNOSIS — U071 COVID-19: Secondary | ICD-10-CM | POA: Diagnosis not present

## 2021-07-15 DIAGNOSIS — I2510 Atherosclerotic heart disease of native coronary artery without angina pectoris: Secondary | ICD-10-CM

## 2021-07-15 MED ORDER — MOLNUPIRAVIR EUA 200MG CAPSULE: 4.0000 | ORAL_CAPSULE | Freq: Two times a day (BID) | ORAL | 0 refills | Status: AC

## 2021-07-15 NOTE — Progress Notes (Signed)
? ? ?Virtual Visit via Telephone Note ? ?I connected with [name] on 07/15/21 at  4:00 PM EDT by telephone and verified that I am speaking with the correct person using two identifiers. ?  ?I discussed the limitations, risks, security and privacy concerns of performing an evaluation and management service by telephone and the availability of in person appointments. I also discussed with the patient that there may be a patient responsible charge related to this service. The patient expressed understanding and agreed to proceed. ? ?Location patient: home ?Location provider: work office ?Participants present for the call: patient, provider ?Patient did not have a visit in the prior 7 days to address this/these issue(s). ? ? ?History of Present Illness: ? ?He has scheduled this visit to discuss a positive COVID test.  His wife started having symptoms late last week, on Sunday she was diagnosed with COVID and placed on molnupiravir.  This morning he woke up at 2:00 in the morning with severe myalgias, headache, fever runny nose.  He took a home COVID test and it was positive.  He would like to be on the same medication his wife is on. ?  ?Observations/Objective: ?Patient sounds congested on the phone. ?I do not appreciate any increased work of breathing. ?Speech and thought processing are grossly intact. ?Patient reported vitals: None reported ? ? ?Current Outpatient Medications:  ?  acetaminophen (TYLENOL) 500 MG tablet, Take 500 mg by mouth every 6 (six) hours as needed., Disp: , Rfl:  ?  aspirin 81 MG tablet, Take 81 mg by mouth daily., Disp: , Rfl:  ?  azelastine (OPTIVAR) 0.05 % ophthalmic solution, Place 1 drop into both eyes 2 (two) times daily., Disp: 6 mL, Rfl: 6 ?  cholecalciferol (VITAMIN D) 1000 UNITS tablet, Take 4,000 Units by mouth daily. , Disp: , Rfl:  ?  clopidogrel (PLAVIX) 75 MG tablet, TAKE 1 TABLET BY MOUTH DAILY, Disp: 90 tablet, Rfl: 0 ?  diclofenac Sodium (VOLTAREN) 1 % GEL, Apply 1  application topically 4 (four) times daily., Disp: 100 g, Rfl: 3 ?  halobetasol (ULTRAVATE) 0.05 % cream, Apply topically 2 (two) times daily., Disp: 50 g, Rfl: 2 ?  levothyroxine (SYNTHROID) 100 MCG tablet, TAKE 1 TABLET BY MOUTH EVERY DAY, Disp: 90 tablet, Rfl: 0 ?  losartan (COZAAR) 100 MG tablet, Take 1 tablet (100 mg total) by mouth daily., Disp: 90 tablet, Rfl: 3 ?  metoprolol succinate (TOPROL-XL) 25 MG 24 hr tablet, Take 1 tablet (25 mg total) by mouth daily., Disp: 90 tablet, Rfl: 3 ?  molnupiravir EUA (LAGEVRIO) 200 mg CAPS capsule, Take 4 capsules (800 mg total) by mouth 2 (two) times daily for 5 days., Disp: 40 capsule, Rfl: 0 ?  Multiple Vitamins-Minerals (CENTRUM SILVER PO), Take 1 tablet by mouth daily., Disp: , Rfl:  ?  neomycin-polymyxin-hydrocortisone (CORTISPORIN) OTIC solution, Place 3 drops into both ears 3 (three) times daily., Disp: 10 mL, Rfl: 3 ?  niacin 500 MG CR capsule, Take 2 capsules (1,000 mg total) by mouth at bedtime., Disp: 100 capsule, Rfl: 3 ?  nitroGLYCERIN (NITROSTAT) 0.4 MG SL tablet, Place 1 tablet (0.4 mg total) under the tongue every 5 (five) minutes as needed for chest pain (3 doses MAX)., Disp: 25 tablet, Rfl: 4 ?  Omega-3 Fatty Acids (FISH OIL) 1200 MG CAPS, Take 1 capsule by mouth daily., Disp: , Rfl:  ?  pantoprazole (PROTONIX) 40 MG tablet, Take 1 tablet (40 mg total) by mouth in the morning., Disp:  90 tablet, Rfl: 0 ?  potassium chloride (KLOR-CON) 10 MEQ tablet, TAKE 1 TABLET BY MOUTH EVERY DAY, Disp: 90 tablet, Rfl: 3 ?  simvastatin (ZOCOR) 40 MG tablet, TAKE 1 TABLET BY MOUTH DAILY, Disp: 90 tablet, Rfl: 3 ? ?Review of Systems: ? ?Constitutional: Denies fever, chills, diaphoresis, appetite change. ?HEENT: Denies photophobia, eye pain, redness, mouth sores, trouble swallowing, neck pain, neck stiffness and tinnitus.   ?Respiratory: Denies SOB, DOE, chest tightness,  and wheezing.   ?Cardiovascular: Denies chest pain, palpitations and leg swelling.   ?Gastrointestinal: Denies nausea, vomiting, abdominal pain, diarrhea, constipation, blood in stool and abdominal distention.  ?Genitourinary: Denies dysuria, urgency, frequency, hematuria, flank pain and difficulty urinating.  ?Endocrine: Denies: hot or cold intolerance, sweats, changes in hair or nails, polyuria, polydipsia. ?Musculoskeletal: Denies myalgias, back pain, joint swelling, arthralgias and gait problem.  ?Skin: Denies pallor, rash and wound.  ?Neurological: Denies dizziness, seizures, syncope, weakness, light-headedness, numbness and headaches.  ?Hematological: Denies adenopathy. Easy bruising, personal or family bleeding history  ?Psychiatric/Behavioral: Denies suicidal ideation, mood changes, confusion, nervousness, sleep disturbance and agitation ? ? ?Assessment and Plan: ? ?COVID-19  ?- Plan: molnupiravir EUA (LAGEVRIO) 200 mg CAPS capsule ?-He may also use OTC medications such as antihistamines, decongestants, pain relievers, guaifenesin. ?-We have reviewed quarantine period of 5 days. ?-We have discussed symptoms that would promote ED evaluation. ?-He knows to follow with [place] if symptoms fail to resolve. ? ? ? ? ?I discussed the assessment and treatment plan with the patient. The patient was provided an opportunity to ask questions and all were answered. The patient agreed with the plan and demonstrated an understanding of the instructions. ?  ?The patient was advised to call back or seek an in-person evaluation if the symptoms worsen or if the condition fails to improve as anticipated. ? ?I provided 13 minutes of non-face-to-face time during this encounter. ? ? ?Lelon Frohlich, MD ?[facility] ? ?

## 2021-07-17 ENCOUNTER — Ambulatory Visit: Payer: Medicare Other | Admitting: Nurse Practitioner

## 2021-07-17 ENCOUNTER — Telehealth: Payer: Self-pay | Admitting: Internal Medicine

## 2021-07-17 NOTE — Telephone Encounter (Signed)
I advised pt of Dr. Nathanial Millman advise. ? ?FYI ?

## 2021-07-17 NOTE — Telephone Encounter (Signed)
Can take tylenol for fever. Continue paxlovid as prescribed. For any SOB seek care sooner. Typical course of symptoms is 1-2 weeks. If worsening call back. ?

## 2021-07-17 NOTE — Telephone Encounter (Signed)
Pts spouse states pt is currently covid + ? ?Pt had a vv on 3-22 and was prescribed paxlovid, spouse states pts symptoms (cough, fatigue, sore throat) are still present w/ 102 temp ? ?Spouse inquiring if pt should be seen are "wait it out" ? ?A cb is requested ? ? ?

## 2021-07-18 ENCOUNTER — Ambulatory Visit (HOSPITAL_COMMUNITY)
Admission: RE | Admit: 2021-07-18 | Discharge: 2021-07-18 | Disposition: A | Discharge status: Home / Self Care | Payer: Medicare Other | Source: Ambulatory Visit | Attending: Family Medicine | Admitting: Family Medicine

## 2021-07-18 ENCOUNTER — Ambulatory Visit (INDEPENDENT_AMBULATORY_CARE_PROVIDER_SITE_OTHER): Payer: Medicare Other

## 2021-07-18 ENCOUNTER — Other Ambulatory Visit: Payer: Self-pay

## 2021-07-18 ENCOUNTER — Encounter (HOSPITAL_COMMUNITY): Payer: Self-pay

## 2021-07-18 VITALS — BP 129/67 | HR 60 | Temp 98.8°F | Resp 20

## 2021-07-18 DIAGNOSIS — U071 COVID-19: Secondary | ICD-10-CM

## 2021-07-18 DIAGNOSIS — R059 Cough, unspecified: Secondary | ICD-10-CM | POA: Diagnosis not present

## 2021-07-18 DIAGNOSIS — R051 Acute cough: Secondary | ICD-10-CM

## 2021-07-18 MED ORDER — HYDROCODONE BIT-HOMATROP MBR 5-1.5 MG/5ML PO SOLN: 5.0000 mL | Freq: Four times a day (QID) | ORAL | 0 refills | Status: DC | PRN

## 2021-07-18 NOTE — ED Triage Notes (Signed)
Cough, sore throat, productive cough and diarrhea started Wednesday morning.  Home covid test was done Wednesday.  Wife was diagnosed one week ago with covid.  Spouse concerned for chest congestion .   ?

## 2021-07-20 NOTE — ED Provider Notes (Signed)
?[facility] ? ? ?[id number] ?07/18/21 Arrival Time: 3299 ? ?ASSESSMENT & PLAN: ? ?1. Acute cough   ?2. COVID-19 virus infection   ? ?I have personally viewed the imaging studies ordered this visit. ?No acute changes on CXR. ? ?Discussed typical duration of viral illnesses. ?OTC symptom care as needed. ? ?Discharge Medication List as of 07/18/2021  4:25 PM  ?  ? ?START taking these medications  ? Details  ?HYDROcodone bit-homatropine (HYCODAN) 5-1.5 MG/5ML syrup Take 5 mLs by mouth every 6 (six) hours as needed for cough., Starting Sat 07/18/2021, Normal  ?  ?  ? ? ? Follow-up Information   ? ? Plotnikov, Evie Lacks, MD.   ?Specialty: Internal Medicine ?Why: As needed. ?Contact information: ?[place] [address] ?[phone] ? ? ?  ?  ? ?  ?  ? ?  ? ? ?Reviewed expectations re: course of current medical issues. Questions answered. ?Outlined signs and symptoms indicating need for more acute intervention. ?Understanding verbalized. ?After Visit Summary given. ? ? ?SUBJECTIVE: ?History from: Patient. ?[name] [name] is a 81 y.o. male. Reports: testing + for COVID; on molnupiravir; few days; bad cough; no SOB or CP. Diarrhea at times; non-bloody. Denies: fever. Normal PO intake without n/v/d. ? ?OBJECTIVE: ? ?Vitals:  ? 07/18/21 1542  ?BP: 129/67  ?Pulse: 60  ?Resp: 20  ?Temp: 98.8 ?F (37.1 ?C)  ?TempSrc: Oral  ?SpO2: 95%  ?  ?General appearance: alert; no distress ?Eyes: PERRLA; EOMI; conjunctiva normal ?HENT: [place]; AT; with nasal congestion ?Neck: supple  ?Lungs: speaks full sentences without difficulty; unlabored; slightly coarse breath sounds bilaterally but otherwise normal ?Extremities: no edema ?Skin: warm and dry ?Neurologic: normal gait ?Psychological: alert and cooperative; normal mood and affect ? ? ?Imaging: ?DG Chest 2 View ? ?Result Date: 07/18/2021 ?CLINICAL DATA:  Cough, COVID EXAM: CHEST - 2 VIEW COMPARISON:  04/01/2020 FINDINGS: The heart size and mediastinal contours are  within normal limits. Both lungs are clear. The visualized skeletal structures are unremarkable. IMPRESSION: No acute abnormality of the lungs. Electronically Signed   By: Delanna Ahmadi M.D.   On: 07/18/2021 16:14  ? ? ?No Known Allergies ? ?Past Medical History:  ?Diagnosis Date  ? Allergic rhinitis   ? Allergy   ? Arthritis   ? in neck  ? CAD (coronary artery disease)   ? Cataract   ? Had surg 08/2016  ? CERUMEN IMPACTION 06/04/2009  ? 5/15    ? Colon polyp   ? Diverticulosis   ? Family hx of colon cancer   ? brother  ? Hyperlipemia   ? Hypertension   ? Hypothyroidism   ? MI (myocardial infarction) [facility]) 1993/2007  ? no stents  ? OSA (obstructive sleep apnea)   ? Psoriasis   ? Sleep apnea   ? no cpap- had sleep study per pt no OSA  ? ?Social History  ? ?Socioeconomic History  ? Marital status: Married  ?  Spouse name: Not on file  ? Number of children: 2  ? Years of education: Not on file  ? Highest education level: Not on file  ?Occupational History  ? Occupation: retired  ?  Employer: SELF-EMPLOYED Engineer, maintenance (IT)  ?  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   ?Tobacco Use  ? Smoking status: Former  ?  Types: Cigarettes  ?  Quit date: 04/1978  ?  Years since quitting: 43.2  ? Smokeless tobacco: Never  ?Vaping Use  ? Vaping Use: Never used  ?  Substance and Sexual Activity  ? Alcohol use: Yes  ?  Alcohol/week: 6.0 standard drinks  ?  Types: 6 Cans of beer per week  ? Drug use: No  ? Sexual activity: Not Currently  ?Other Topics Concern  ? Not on file  ?Social History Narrative  ? Not on file  ? ?Social Determinants of Health  ? ?Financial Resource Strain: Low Risk   ? Difficulty of Paying Living Expenses: Not hard at all  ?Food Insecurity: No Food Insecurity  ? Worried About Charity fundraiser in the Last Year: Never true  ? Ran Out of Food in the Last Year: Never true  ?Transportation Needs: No Transportation Needs  ? Lack of Transportation (Medical): No  ? Lack of Transportation (Non-Medical): No  ?Physical Activity: Sufficiently  Active  ? Days of Exercise per Week: 5 days  ? Minutes of Exercise per Session: 30 min  ?Stress: No Stress Concern Present  ? Feeling of Stress : Not at all  ?Social Connections: Socially Integrated  ? Frequency of Communication with Friends and Family: More than three times a week  ? Frequency of Social Gatherings with Friends and Family: Once a week  ? Attends Religious Services: More than 4 times per year  ? Active Member of Clubs or Organizations: Yes  ? Attends Archivist Meetings: More than 4 times per year  ? Marital Status: Married  ?Intimate Partner Violence: Not on file  ? ?Family History  ?Problem Relation Age of Onset  ? Colon cancer Brother   ?     died 58  ? Coronary artery disease Mother   ? Heart disease Mother   ? Colon polyps Sister   ?     x2  ? COPD Father   ? Colon cancer Paternal Grandfather   ? Cancer Brother   ? Bone cancer Brother   ? Hypertension Other   ? Colon cancer Other   ? Colon cancer Maternal Aunt   ? Colon cancer Maternal Uncle   ? Colon cancer Maternal Aunt   ? Colon cancer Paternal Aunt   ? Esophageal cancer Neg Hx   ? Rectal cancer Neg Hx   ? Stomach cancer Neg Hx   ? ?Past Surgical History:  ?Procedure Laterality Date  ? CARDIAC CATHETERIZATION  1993  ? COLONOSCOPY    ? POLYPECTOMY    ? PTCA    ? TONSILLECTOMY    ? ?  ?Vanessa Kick, MD ?07/20/21 1005 ? ?

## 2021-07-24 ENCOUNTER — Ambulatory Visit: Payer: Medicare Other | Admitting: Nurse Practitioner

## 2021-08-05 ENCOUNTER — Other Ambulatory Visit (INDEPENDENT_AMBULATORY_CARE_PROVIDER_SITE_OTHER): Payer: Medicare Other

## 2021-08-05 ENCOUNTER — Encounter: Payer: Self-pay | Admitting: Nurse Practitioner

## 2021-08-05 ENCOUNTER — Ambulatory Visit (INDEPENDENT_AMBULATORY_CARE_PROVIDER_SITE_OTHER): Payer: Medicare Other | Admitting: Nurse Practitioner

## 2021-08-05 VITALS — BP 144/60 | HR 59 | Ht 73.0 in | Wt 199.2 lb

## 2021-08-05 DIAGNOSIS — D649 Anemia, unspecified: Secondary | ICD-10-CM

## 2021-08-05 DIAGNOSIS — I2510 Atherosclerotic heart disease of native coronary artery without angina pectoris: Secondary | ICD-10-CM | POA: Diagnosis not present

## 2021-08-05 LAB — FERRITIN: Ferritin: 7.5 ng/mL — ABNORMAL LOW (ref 22.0–322.0)

## 2021-08-05 LAB — CBC
HCT: 33.6 % — ABNORMAL LOW (ref 39.0–52.0)
Hemoglobin: 10.9 g/dL — ABNORMAL LOW (ref 13.0–17.0)
MCHC: 32.4 g/dL (ref 30.0–36.0)
MCV: 79.2 fl (ref 78.0–100.0)
Platelets: 365 10*3/uL (ref 150.0–400.0)
RBC: 4.24 Mil/uL (ref 4.22–5.81)
RDW: 16.4 % — ABNORMAL HIGH (ref 11.5–15.5)
WBC: 6.3 10*3/uL (ref 4.0–10.5)

## 2021-08-05 NOTE — Progress Notes (Signed)
? ? ? ?Assessment  ? ?Patient Profile:  ?[name] [name] is a 81 y.o. male known to Dr. Hilarie Fredrickson with a past medical history of personal history of adenomatous and sessile serrated colon polyps, family history of colon cancer, GERD with history of dysphagia due to hiatal hernia with Schatzki's ring, CAD on Plavix, hypertension, hyperlipidemia , COVID19 in March 2023 and hypothyroidism.  Additional medical history as listed in [place] . ? ?Anemia, borderline microcytic with MCV of 80. Baseline hgb 13, down to 11.5 on 07/02/21.  No overt GI blood loss. Brown heme negative stool today. No GI symptoms other than intermittent diarrhea which is likely diet related. Spurious result?  ? ?Intentional weight loss. Down 14 pounds since December ? ?Intermittent loose stool ( maybe 3 times a month) and usually diet dependent.  ? ?History of colon polyps. He has aged out of surveillance.  ? ?5. CAD,  on plavix ? ? ?Plan  ? ?Repeat CBC today. Add TIBC and ferritin ?Further recommendations pending above.  ? ? ?History of Present Illness  ? ?Chief Complaint : none . Here for anemia ? ?Saw PCP March 9th for wellness check up. Labs showed hgb dropped from 13 to 11.5. MCV 80.  ? ?He hasn't seen any blood in stool, no black stool. He has had intermittent diarrhea for 6-8 months. Averages 3-4 times loose BMs a month, usually depending on what he eats.  No nocturnal stools. He is trying to lose weight. No abdominal pain, nausea or vomiting. Takes one baby asa / day, no other NSAIDs. On plavix ? ?Laboratory data: ? ?  Latest Ref Rng & Units 07/02/2021  ?  8:53 AM 04/01/2020  ?  1:52 PM 03/02/2019  ?  7:27 AM  ?Hepatic Function  ?Total Protein 6.0 - 8.3 g/dL 6.7   7.8   6.7    ?Albumin 3.5 - 5.2 g/dL 4.4   4.7   4.4    ?AST 0 - 37 U/L '26   25   24    ' ?ALT 0 - 53 U/L '21   24   24    ' ?Alk Phosphatase 39 - 117 U/L 45   52   39    ?Total Bilirubin 0.2 - 1.2 mg/dL 1.0   1.0   0.9    ?Bilirubin, Direct 0.0 - 0.3 mg/dL   0.2    ? ? ? ?  Latest Ref Rng &  Units 07/02/2021  ?  8:53 AM 04/01/2020  ?  1:52 PM 03/02/2019  ?  7:27 AM  ?CBC  ?WBC 4.0 - 10.5 K/uL 6.1   8.0   8.7    ?Hemoglobin 13.0 - 17.0 g/dL 11.5   13.0   13.1    ?Hematocrit 39.0 - 52.0 % 36.5   39.5   39.9    ?Platelets 150.0 - 400.0 K/uL 266.0   246.0   216.0    ? ? ?Previous GI Evaluations  ? ?Endoscopies: ( most recent) ? ?April 2019 EGD for dysphagia ?- 3 cm hiatal hernia. ?- Normal mucosa was found in the entire esophagus. Esophagitis has healed. ?- Low-grade of narrowing Schatzki ring. Dilated to 15.5 mm ?- Normal stomach. ?- Normal examined duodenum. ?- No specimens collected ? ?Dec 2020 Surveillance colonoscopy ?- One 5 mm polyp in the sigmoid colon, removed with a cold snare. Resected and retrieved. ?- Diverticulosis in the sigmoid colon. ?- Small internal hemorrhoids ?Path - TA ? ?Past Medical History:  ?Diagnosis Date  ? Allergic  rhinitis   ? Allergy   ? Arthritis   ? in neck  ? CAD (coronary artery disease)   ? Cataract   ? Had surg 08/2016  ? CERUMEN IMPACTION 06/04/2009  ? 5/15    ? Colon polyp   ? Diverticulosis   ? Family hx of colon cancer   ? brother  ? Hyperlipemia   ? Hypertension   ? Hypothyroidism   ? MI (myocardial infarction) [facility]) 1993/2007  ? no stents  ? OSA (obstructive sleep apnea)   ? Psoriasis   ? Sleep apnea   ? no cpap- had sleep study per pt no OSA  ? ? ?Past Surgical History:  ?Procedure Laterality Date  ? CARDIAC CATHETERIZATION  1993  ? COLONOSCOPY    ? POLYPECTOMY    ? PTCA    ? TONSILLECTOMY    ? ? ?Current Medications, Allergies, Family History and Social History were reviewed in [employer] record. ?  ?  ?Current Outpatient Medications  ?Medication Sig Dispense Refill  ? acetaminophen (TYLENOL) 500 MG tablet Take 500 mg by mouth every 6 (six) hours as needed.    ? aspirin 81 MG tablet Take 81 mg by mouth daily.    ? azelastine (OPTIVAR) 0.05 % ophthalmic solution Place 1 drop into both eyes 2 (two) times daily. 6 mL 6  ? cholecalciferol (VITAMIN  D) 1000 UNITS tablet Take 4,000 Units by mouth daily.     ? clopidogrel (PLAVIX) 75 MG tablet TAKE 1 TABLET BY MOUTH DAILY 90 tablet 0  ? diclofenac Sodium (VOLTAREN) 1 % GEL Apply 1 application topically 4 (four) times daily. 100 g 3  ? halobetasol (ULTRAVATE) 0.05 % cream Apply topically 2 (two) times daily. 50 g 2  ? levothyroxine (SYNTHROID) 100 MCG tablet TAKE 1 TABLET BY MOUTH EVERY DAY 90 tablet 0  ? losartan (COZAAR) 100 MG tablet Take 1 tablet (100 mg total) by mouth daily. 90 tablet 3  ? metoprolol succinate (TOPROL-XL) 25 MG 24 hr tablet Take 1 tablet (25 mg total) by mouth daily. 90 tablet 3  ? Multiple Vitamins-Minerals (CENTRUM SILVER PO) Take 1 tablet by mouth daily.    ? neomycin-polymyxin-hydrocortisone (CORTISPORIN) OTIC solution Place 3 drops into both ears 3 (three) times daily. 10 mL 3  ? niacin 500 MG CR capsule Take 2 capsules (1,000 mg total) by mouth at bedtime. 100 capsule 3  ? nitroGLYCERIN (NITROSTAT) 0.4 MG SL tablet Place 1 tablet (0.4 mg total) under the tongue every 5 (five) minutes as needed for chest pain (3 doses MAX). 25 tablet 4  ? Omega-3 Fatty Acids (FISH OIL) 1200 MG CAPS Take 1 capsule by mouth daily.    ? pantoprazole (PROTONIX) 40 MG tablet Take 1 tablet (40 mg total) by mouth in the morning. 90 tablet 0  ? potassium chloride (KLOR-CON) 10 MEQ tablet TAKE 1 TABLET BY MOUTH EVERY DAY 90 tablet 3  ? simvastatin (ZOCOR) 40 MG tablet TAKE 1 TABLET BY MOUTH DAILY 90 tablet 3  ? ?No current facility-administered medications for this visit.  ? ? ?Review of Systems: ?No chest pain. No shortness of breath. No urinary complaints.  ? ? ?Physical Exam  ? ?Wt Readings from Last 3 Encounters:  ?08/05/21 199 lb 4 oz (90.4 kg)  ?07/15/21 196 lb (88.9 kg)  ?07/02/21 202 lb (91.6 kg)  ? ? ?BP (!) 144/60   Pulse (!) 59   Ht '6\' 1"'  (1.854 m)   Wt 199 lb 4  oz (90.4 kg)   BMI 26.29 kg/m?  ?Constitutional:  Generally well appearing male in no acute distress. ?Psychiatric: Pleasant. Normal mood  and affect. Behavior is normal. ?EENT: Pupils normal.  Conjunctivae are normal. No scleral icterus. ?Neck supple.  ?Cardiovascular: Normal rate, regular rhythm. No edema ?Pulmonary/chest: Effort normal and breath sounds normal. No wheezing, rales or rhonchi. ?Abdominal: Soft, nondistended, nontender. Bowel sounds active throughout. There are no masses palpable. No hepatomegaly. ?Rectal: Scan light brown stool in vault, heme negative.  ?Neurological: Alert and oriented to person place and time. ?Skin: Skin is warm and dry. No rashes noted. ? ?Tye Savoy, NP  08/05/2021, 10:16 AM ? ?Cc:  ?Plotnikov, Evie Lacks, MD ? ? ? ? ? ? ? ?

## 2021-08-05 NOTE — Patient Instructions (Signed)
Please proceed to the basement level for lab work before leaving today. Press "B" on the elevator. The lab is located at the first door on the left as you exit the elevator. ? ?HEALTHCARE LAWS AND MY CHART RESULTS:  ? ?Due to recent changes in healthcare laws, you may see results of your imaging and/or laboratory studies on MyChart before I have had a chance to review them.  I understand that in some cases there may be results that are confusing or concerning to you. Please understand that not all results are received at the same time and often I may need to interpret multiple results in order to provide you with the best plan of care or course of treatment. Therefore, I ask that you please give me 48 hours to thoroughly review all your results before contacting my office for clarification.  ? ?We will contact you once we have all the test results. ? ?Thank you for trusting me with your gastrointestinal care!   ? ?Tye Savoy, NP ? ? ?BMI: ? ?If you are age 81 or older, your body mass index should be between 23-30. Your Body mass index is 26.29 kg/m?[name] If this is out of the aforementioned range listed, please consider follow up with your Primary Care Provider. ? ?If you are age 57 or younger, your body mass index should be between 19-25. Your Body mass index is 26.29 kg/m?[name] If this is out of the aformentioned range listed, please consider follow up with your Primary Care Provider.  ? ?MY CHART: ? ?The [facility] providers would like to encourage you to use [facility] to communicate with providers for non-urgent requests or questions.  Due to long hold times on the telephone, sending your provider a message by [facility] may be a faster and more efficient way to get a response.  Please allow 48 business hours for a response.  Please remember that this is for non-urgent requests.  ? ?

## 2021-08-06 LAB — IRON AND TIBC
Iron Saturation: 5 % — CL (ref 15–55)
Iron: 20 ug/dL — ABNORMAL LOW (ref 38–169)
Total Iron Binding Capacity: 414 ug/dL (ref 250–450)
UIBC: 394 ug/dL — ABNORMAL HIGH (ref 111–343)

## 2021-08-11 ENCOUNTER — Telehealth: Payer: Self-pay

## 2021-08-11 ENCOUNTER — Other Ambulatory Visit: Payer: Self-pay

## 2021-08-11 DIAGNOSIS — D509 Iron deficiency anemia, unspecified: Secondary | ICD-10-CM

## 2021-08-11 MED ORDER — NA SULFATE-K SULFATE-MG SULF 17.5-3.13-1.6 GM/177ML PO SOLN: 1.0000 | Freq: Once | ORAL | 0 refills | Status: AC

## 2021-08-11 NOTE — Telephone Encounter (Signed)
[facility] Pre-operative Risk Assessment  ?   ?Request for surgical clearance:     Endoscopy Procedure ? ?What type of surgery is being performed?     EGD and Colonoscopy ? ?When is this surgery scheduled?     09/10/21 ? ?What type of clearance is required ?   Pharmacy ? ?Are there any medications that need to be held prior to surgery and how long? Plavix 5 day hold ? ?Practice name and name of physician performing surgery?      [facility] ? ?What is your office phone and fax number?      Phone- [phone]  Fax- [phone] ? ?Anesthesia type (None, local, MAC, general) ?       [facility]  ?

## 2021-08-11 NOTE — Telephone Encounter (Signed)
? ?  Patient Name: [name] [name]  ?DOB: [birth year] ?MRN: [id number] ? ?Primary Cardiologist: Jenkins Rouge, MD ? ?Chart reviewed as part of pre-operative protocol coverage. Patient with known history of CAD, HTN, HLD, hypothyroidism, OSA amongst other history outlined. Per Dr. Kyla Balzarine note, has h/o "Known CTO RCA, Previous cutting balloon D1 latter occluded in 2008. Last cath 2011 with 70% mid LAD Rx medically by Dr Olevia Perches." Last ischemic eval was by nuc in 2021 with normal LVEF, similar to prior, demonstrating a fixed defect in the inferior wall without significant reversibility, suggesting prior infarct, Dr. Johnsie Cancel felt no ischemia and recommended continued medical therapy. He previously granted the patient permission to hold Plavix for 5 days in a clearance in 2020. Given no interim PCIs/MI since that time, the same recommendation can be applied here. ? ?I reached out to patient for update on how he is doing. The patient affirms he has been doing well without any new cardiac symptoms. Therefore, based on ACC/AHA guidelines, the patient would be at acceptable risk for the planned procedure without further cardiovascular testing. The patient was advised that if he develops new symptoms prior to surgery to contact our office to arrange for a follow-up visit, and he verbalized understanding. ? ?Will route this bundled recommendation to requesting provider via Epic fax function. Please call with questions. ? ?Charlie Pitter, PA-C ?08/11/2021, 10:49 AM ? ? ?

## 2021-08-14 NOTE — Progress Notes (Signed)
Addendum: ?Reviewed and agree with assessment and management plan. ?If IDA then will need to consider endoscopic evaluation ?Syrita Dovel, Lajuan Lines, MD ? ?

## 2021-08-17 NOTE — Telephone Encounter (Signed)
Spoke with pt and he stated he is aware of plavix hold and had no other questions at end of call.  ?

## 2021-09-08 ENCOUNTER — Ambulatory Visit: Payer: Medicare Other | Admitting: Cardiovascular Disease

## 2021-09-10 ENCOUNTER — Ambulatory Visit (AMBULATORY_SURGERY_CENTER): Payer: Medicare Other | Admitting: Internal Medicine

## 2021-09-10 ENCOUNTER — Encounter: Payer: Self-pay | Admitting: Internal Medicine

## 2021-09-10 VITALS — BP 101/65 | HR 56 | Temp 97.5°F | Resp 14 | Ht 73.0 in | Wt 199.0 lb

## 2021-09-10 DIAGNOSIS — K5520 Angiodysplasia of colon without hemorrhage: Secondary | ICD-10-CM

## 2021-09-10 DIAGNOSIS — D509 Iron deficiency anemia, unspecified: Secondary | ICD-10-CM | POA: Diagnosis not present

## 2021-09-10 DIAGNOSIS — K219 Gastro-esophageal reflux disease without esophagitis: Secondary | ICD-10-CM | POA: Diagnosis not present

## 2021-09-10 DIAGNOSIS — K222 Esophageal obstruction: Secondary | ICD-10-CM | POA: Diagnosis not present

## 2021-09-10 DIAGNOSIS — I2510 Atherosclerotic heart disease of native coronary artery without angina pectoris: Secondary | ICD-10-CM | POA: Diagnosis not present

## 2021-09-10 DIAGNOSIS — D128 Benign neoplasm of rectum: Secondary | ICD-10-CM

## 2021-09-10 DIAGNOSIS — I10 Essential (primary) hypertension: Secondary | ICD-10-CM | POA: Diagnosis not present

## 2021-09-10 DIAGNOSIS — K449 Diaphragmatic hernia without obstruction or gangrene: Secondary | ICD-10-CM

## 2021-09-10 DIAGNOSIS — K5730 Diverticulosis of large intestine without perforation or abscess without bleeding: Secondary | ICD-10-CM | POA: Diagnosis not present

## 2021-09-10 MED ORDER — SODIUM CHLORIDE 0.9 % IV SOLN: 500.0000 mL | Freq: Once | INTRAVENOUS | Status: DC

## 2021-09-10 NOTE — Op Note (Signed)
[facility] Patient Name: [name] [name] Procedure Date: 09/10/2021 1:56 PM MRN: [id number] Endoscopist: Jerene Bears , MD Age: 81 Referring MD:  Date of Birth: 1940-08-08 Gender: Male Account #: [id number] Procedure:                Colonoscopy Indications:              Iron deficiency anemia, personal hx of adenomatous                            and sessile serrated colon polyps -- last                            colonoscopy 2020 Medicines:                Monitored Anesthesia Care Procedure:                Pre-Anesthesia Assessment:                           - Prior to the procedure, a History and Physical                            was performed, and patient medications and                            allergies were reviewed. The patient's tolerance of                            previous anesthesia was also reviewed. The risks                            and benefits of the procedure and the sedation                            options and risks were discussed with the patient.                            All questions were answered, and informed consent                            was obtained. Prior Anticoagulants: The patient has                            taken Plavix (clopidogrel), last dose was 5 days                            prior to procedure. ASA Grade Assessment: II - A                            patient with mild systemic disease. After reviewing                            the risks and benefits, the patient was deemed in  satisfactory condition to undergo the procedure.                           After obtaining informed consent, the colonoscope                            was passed under direct vision. Throughout the                            procedure, the patient's blood pressure, pulse, and                            oxygen saturations were monitored continuously. The                            CF [id number] #[id number] was introduced  through the anus                            and advanced to the cecum, identified by                            appendiceal orifice and ileocecal valve. The                            colonoscopy was performed without difficulty. The                            patient tolerated the procedure well. The quality                            of the bowel preparation was good. The ileocecal                            valve, appendiceal orifice, and rectum were                            photographed. Scope In: 2:10:18 PM Scope Out: 2:28:30 PM Scope Withdrawal Time: 0 hours 12 minutes 44 seconds  Total Procedure Duration: 0 hours 18 minutes 12 seconds  Findings:                 The digital rectal exam was normal.                           Multiple small angioectasias with typical                            arborization were found in the ascending colon and                            in the cecum.                           Scattered small-mouthed diverticula were found in  the sigmoid colon and descending colon.                           A 4 mm polyp was found in the rectum. The polyp was                            sessile. The polyp was removed with a cold snare.                            Resection and retrieval were complete.                           The retroflexed view of the distal rectum and anal                            verge was normal and showed no anal or rectal                            abnormalities. Complications:            No immediate complications. Estimated Blood Loss:     Estimated blood loss: none. 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 This is the most likely source of                            iron deficiency anemia.                           - Diverticulosis in the sigmoid colon and in the                            descending colon.                           - One  4 mm polyp in the rectum, removed with a cold                            snare. Resected and retrieved.                           - The distal rectum and anal verge are normal on                            retroflexion view. Recommendation:           - Patient has a contact number available for                            emergencies. The signs and symptoms of potential  delayed complications were discussed with the                            patient. Return to normal activities tomorrow.                            Written discharge instructions were provided to the                            patient.                           - Resume previous diet.                           - Continue present medications.                           - Begin ferrous sulfate 325 mg PO once daily.                            Repeat CBC, ferritin + IBC in 3 months. If oral                            iron causes any GI side-effects please let me know                            and IV iron will be recommended.                           - Await pathology results.                           - Resume Plavix (clopidogrel) at prior dose                            tomorrow. Refer to managing physician for further                            adjustment of therapy.                           - No recommendation at this time regarding repeat                            colonoscopy due to age. However, if IDA persists                            despite iron replacement repeat colonoscopy in the                            outpatient hospital setting can be performed for                            APC ablation of right colonic angioectasias. Ulice Dash  Everitt Amber, MD 09/10/2021 2:39:43 PM This report has been signed electronically.

## 2021-09-10 NOTE — Progress Notes (Signed)
Report to PACU, RN, vss, BBS= Clear.  

## 2021-09-10 NOTE — Progress Notes (Signed)
Called to room to assist during endoscopic procedure.  Patient ID and intended procedure confirmed with present staff. Received instructions for my participation in the procedure from the performing physician.  

## 2021-09-10 NOTE — Progress Notes (Signed)
GASTROENTEROLOGY PROCEDURE H&P NOTE   Primary Care Physician: Cassandria Anger, MD    Reason for Procedure:  Iron deficiency anemia/microcytic anemia  Plan:    EGD and colonoscopy  Patient is appropriate for endoscopic procedure(s) in the ambulatory ([place]) setting.  The nature of the procedure, as well as the risks, benefits, and alternatives were carefully and thoroughly reviewed with the patient. Ample time for discussion and questions allowed. The patient understood, was satisfied, and agreed to proceed.     HPI: [name] [name] is a 81 y.o. male who presents for EGD and colonoscopy.  Plavix has been on hold x5 days.  Medical history as below.  Tolerated the prep.  No recent chest pain or shortness of breath.  No abdominal pain today.  Past Medical History:  Diagnosis Date   Allergic rhinitis    Allergy    Anemia    Arthritis    in neck   CAD (coronary artery disease)    Cataract    Had surg 08/2016   CERUMEN IMPACTION 06/04/2009   5/15     Clotting disorder ([place])    Colon polyp    Diverticulosis    Family hx of colon cancer    brother   Hyperlipemia    Hypertension    Hypothyroidism    MI (myocardial infarction) ([place]) 1993/2007   no stents   OSA (obstructive sleep apnea)    Psoriasis    Sleep apnea    no cpap- had sleep study per pt no OSA    Past Surgical History:  Procedure Laterality Date   CARDIAC CATHETERIZATION  1993   COLONOSCOPY     POLYPECTOMY     PTCA     TONSILLECTOMY      Prior to Admission medications   Medication Sig Start Date End Date Taking? Authorizing Provider  aspirin 81 MG tablet Take 81 mg by mouth daily.   Yes [provider]  cholecalciferol (VITAMIN D) 1000 UNITS tablet Take 4,000 Units by mouth daily.    Yes [provider]  diclofenac Sodium (VOLTAREN) 1 % GEL Apply 1 application topically 4 (four) times daily. 04/16/21  Yes Plotnikov, Evie Lacks, MD  levothyroxine (SYNTHROID) 100 MCG tablet TAKE 1  TABLET BY MOUTH EVERY DAY 07/02/21  Yes Plotnikov, Evie Lacks, MD  losartan (COZAAR) 100 MG tablet Take 1 tablet (100 mg total) by mouth daily. 07/02/21  Yes Plotnikov, Evie Lacks, MD  metoprolol succinate (TOPROL-XL) 25 MG 24 hr tablet Take 1 tablet (25 mg total) by mouth daily. 07/02/21  Yes Plotnikov, Evie Lacks, MD  Multiple Vitamins-Minerals (CENTRUM SILVER PO) Take 1 tablet by mouth daily.   Yes [provider]  niacin 500 MG CR capsule Take 2 capsules (1,000 mg total) by mouth at bedtime. 07/02/21  Yes Plotnikov, Evie Lacks, MD  Omega-3 Fatty Acids (FISH OIL) 1200 MG CAPS Take 1 capsule by mouth daily.   Yes [provider]  pantoprazole (PROTONIX) 40 MG tablet Take 1 tablet (40 mg total) by mouth in the morning. 07/02/21  Yes Plotnikov, Evie Lacks, MD  potassium chloride (KLOR-CON) 10 MEQ tablet TAKE 1 TABLET BY MOUTH EVERY DAY 06/22/21  Yes Plotnikov, Evie Lacks, MD  simvastatin (ZOCOR) 40 MG tablet TAKE 1 TABLET BY MOUTH DAILY 07/02/21  Yes Plotnikov, Evie Lacks, MD  acetaminophen (TYLENOL) 500 MG tablet Take 500 mg by mouth every 6 (six) hours as needed.    [provider]  azelastine (OPTIVAR) 0.05 % ophthalmic solution  Place 1 drop into both eyes 2 (two) times daily. 11/30/17   Plotnikov, Evie Lacks, MD  clopidogrel (PLAVIX) 75 MG tablet TAKE 1 TABLET BY MOUTH DAILY 07/02/21   Plotnikov, Evie Lacks, MD  halobetasol (ULTRAVATE) 0.05 % cream Apply topically 2 (two) times daily. 04/01/20   Plotnikov, Evie Lacks, MD  neomycin-polymyxin-hydrocortisone (CORTISPORIN) OTIC solution Place 3 drops into both ears 3 (three) times daily. 07/02/21 10/10/21  Plotnikov, Evie Lacks, MD  nitroGLYCERIN (NITROSTAT) 0.4 MG SL tablet Place 1 tablet (0.4 mg total) under the tongue every 5 (five) minutes as needed for chest pain (3 doses MAX). 01/23/15   Josue Hector, MD    Current Outpatient Medications  Medication Sig Dispense Refill   aspirin 81 MG tablet Take 81 mg by mouth daily.     cholecalciferol  (VITAMIN D) 1000 UNITS tablet Take 4,000 Units by mouth daily.      diclofenac Sodium (VOLTAREN) 1 % GEL Apply 1 application topically 4 (four) times daily. 100 g 3   levothyroxine (SYNTHROID) 100 MCG tablet TAKE 1 TABLET BY MOUTH EVERY DAY 90 tablet 0   losartan (COZAAR) 100 MG tablet Take 1 tablet (100 mg total) by mouth daily. 90 tablet 3   metoprolol succinate (TOPROL-XL) 25 MG 24 hr tablet Take 1 tablet (25 mg total) by mouth daily. 90 tablet 3   Multiple Vitamins-Minerals (CENTRUM SILVER PO) Take 1 tablet by mouth daily.     niacin 500 MG CR capsule Take 2 capsules (1,000 mg total) by mouth at bedtime. 100 capsule 3   Omega-3 Fatty Acids (FISH OIL) 1200 MG CAPS Take 1 capsule by mouth daily.     pantoprazole (PROTONIX) 40 MG tablet Take 1 tablet (40 mg total) by mouth in the morning. 90 tablet 0   potassium chloride (KLOR-CON) 10 MEQ tablet TAKE 1 TABLET BY MOUTH EVERY DAY 90 tablet 3   simvastatin (ZOCOR) 40 MG tablet TAKE 1 TABLET BY MOUTH DAILY 90 tablet 3   acetaminophen (TYLENOL) 500 MG tablet Take 500 mg by mouth every 6 (six) hours as needed.     azelastine (OPTIVAR) 0.05 % ophthalmic solution Place 1 drop into both eyes 2 (two) times daily. 6 mL 6   clopidogrel (PLAVIX) 75 MG tablet TAKE 1 TABLET BY MOUTH DAILY 90 tablet 0   halobetasol (ULTRAVATE) 0.05 % cream Apply topically 2 (two) times daily. 50 g 2   neomycin-polymyxin-hydrocortisone (CORTISPORIN) OTIC solution Place 3 drops into both ears 3 (three) times daily. 10 mL 3   nitroGLYCERIN (NITROSTAT) 0.4 MG SL tablet Place 1 tablet (0.4 mg total) under the tongue every 5 (five) minutes as needed for chest pain (3 doses MAX). 25 tablet 4   Current Facility-Administered Medications  Medication Dose Route Frequency Provider Last Rate Last Admin   0.9 %  sodium chloride infusion  500 mL Intravenous Once Issai Werling, Lajuan Lines, MD        Allergies as of 09/10/2021   (No Known Allergies)    Family History  Problem Relation Age of Onset    Coronary artery disease Mother    Heart disease Mother    COPD Father    Colon polyps Sister        x2   Colon cancer Brother        died 24   Cancer Brother    Bone cancer Brother    Colon cancer Maternal Aunt    Colon cancer Maternal Aunt    Colon cancer Maternal Uncle  Colon cancer Paternal Aunt    Colon cancer Paternal Grandfather    Hypertension Other    Colon cancer Other    Esophageal cancer Neg Hx    Rectal cancer Neg Hx    Stomach cancer Neg Hx    Pancreatic cancer Neg Hx     Social History   Socioeconomic History   Marital status: Married    Spouse name: Not on file   Number of children: 2   Years of education: Not on file   Highest education level: Not on file  Occupational History   Occupation: retired    Fish farm manager: SELF-EMPLOYED Engineer, 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   Tobacco Use   Smoking status: Former    Types: Cigarettes    Quit date: 04/1978    Years since quitting: 43.4   Smokeless tobacco: Never  Vaping Use   Vaping Use: Never used  Substance and Sexual Activity   Alcohol use: Yes    Alcohol/week: 6.0 standard drinks    Types: 6 Cans of beer per week   Drug use: No   Sexual activity: Not Currently  Other Topics Concern   Not on file  Social History Narrative   Not on file   Social Determinants of Health   Financial Resource Strain: Low Risk    Difficulty of Paying Living Expenses: Not hard at all  Food Insecurity: No Food Insecurity   Worried About Charity fundraiser in the Last Year: Never true   [place] in the Last Year: Never true  Transportation Needs: No Transportation Needs   Lack of Transportation (Medical): No   Lack of Transportation (Non-Medical): No  Physical Activity: Sufficiently Active   Days of Exercise per Week: 5 days   Minutes of Exercise per Session: 30 min  Stress: No Stress Concern Present   Feeling of Stress : Not at all  Social Connections: Socially Integrated   Frequency of Communication  with Friends and Family: More than three times a week   Frequency of Social Gatherings with Friends and Family: Once a week   Attends Religious Services: More than 4 times per year   Active Member of [employer] or Organizations: Yes   Attends Music therapist: More than 4 times per year   Marital Status: Married  Human resources officer Violence: Not on file    Physical Exam: Vital signs in last 24 hours: '@BP'$  (!) 153/58   Pulse (!) 53   Temp (!) 97.5 F (36.4 C)   Ht '6\' 1"'$  (1.854 m)   Wt 199 lb (90.3 kg)   SpO2 100%   BMI 26.25 kg/m  GEN: NAD EYE: Sclerae anicteric ENT: MMM CV: Non-tachycardic Pulm: CTA b/l GI: Soft, NT/ND NEURO:  Alert & Oriented x 3   Zenovia Jarred, MD [facility]  09/10/2021 1:53 PM

## 2021-09-10 NOTE — Op Note (Addendum)
[facility] Patient Name: [name] [name] Procedure Date: 09/10/2021 1:56 PM MRN: [id number] Endoscopist: Jerene Bears , MD Age: 81 Referring MD:  Date of Birth: August 21, 1940 Gender: Male Account #: [id number] Procedure:                Upper GI endoscopy Indications:              Iron deficiency anemia Medicines:                Monitored Anesthesia Care Procedure:                Pre-Anesthesia Assessment:                           - Prior to the procedure, a History and Physical                            was performed, and patient medications and                            allergies were reviewed. The patient's tolerance of                            previous anesthesia was also reviewed. The risks                            and benefits of the procedure and the sedation                            options and risks were discussed with the patient.                            All questions were answered, and informed consent                            was obtained. Prior Anticoagulants: The patient has                            taken Plavix (clopidogrel), last dose was 5 days                            prior to procedure. ASA Grade Assessment: II - A                            patient with mild systemic disease. After reviewing                            the risks and benefits, the patient was deemed in                            satisfactory condition to undergo the procedure.                           After obtaining informed consent, the endoscope was  passed under direct vision. Throughout the                            procedure, the patient's blood pressure, pulse, and                            oxygen saturations were monitored continuously. The                            GIF [id number] #[id number] was introduced through the                            mouth, and advanced to the second part of duodenum.                            The upper GI endoscopy  was accomplished without                            difficulty. The patient tolerated the procedure                            well. Scope In: Scope Out: Findings:                 A non-obstructing Schatzki ring was found at the                            gastroesophageal junction. Esophageal mucosa normal.                           A 5 cm hiatal hernia was present. At the                            diaphragmatic hiatus there are scattered Cameron's                            erosions without bleeding.                           The entire examined stomach was normal. Biopsies                            were taken with a cold forceps for histology and                            Helicobacter pylori testing.                           The examined duodenum was normal. Biopsies for                            histology were taken with a cold forceps for                            evaluation of celiac disease. Complications:  No immediate complications. Estimated Blood Loss:     Estimated blood loss was minimal. Impression:               - Non-obstructing Schatzki ring.                           - 5 cm hiatal hernia.                           - Scattered Cameron's erosions (related to hiatal                            hernia). Nonbleeding today.                           - Normal stomach. Biopsied.                           - Normal examined duodenum. Biopsied. Recommendation:           - Patient has a contact number available for                            emergencies. The signs and symptoms of potential                            delayed complications were discussed with the                            patient. Return to normal activities tomorrow.                            Written discharge instructions were provided to the                            patient.                           - Resume previous diet.                           - Continue present medications. Continue                             pantoprazole 40 mg daily.                           - Await pathology results.                           - Cameron's erosions are potential source of IDA                            (though not bleeding today).                           - See the other procedure note for documentation of  additional recommendations. Jerene Bears, MD 09/10/2021 2:33:55 PM This report has been signed electronically.

## 2021-09-10 NOTE — Patient Instructions (Addendum)
Handouts given for Hiatal Hernia, Diverticulosis and Polyps.  RESUME PLAVIX TOMORROW, Friday 09/11/21.   Begin taking over the counter Iron supplement (Ferrous Sulfate 325 mg daily) .  YOU HAD AN ENDOSCOPIC PROCEDURE TODAY AT [facility]:   Refer to the procedure report that was given to you for any specific questions about what was found during the examination.  If the procedure report does not answer your questions, please call your gastroenterologist to clarify.  If you requested that your care partner not be given the details of your procedure findings, then the procedure report has been included in a sealed envelope for you to review at your convenience later.  YOU SHOULD EXPECT: Some feelings of bloating in the abdomen. Passage of more gas than usual.  Walking can help get rid of the air that was put into your GI tract during the procedure and reduce the bloating. If you had a lower endoscopy (such as a colonoscopy or flexible sigmoidoscopy) you may notice spotting of blood in your stool or on the toilet paper. If you underwent a bowel prep for your procedure, you may not have a normal bowel movement for a few days.  Please Note:  You might notice some irritation and congestion in your nose or some drainage.  This is from the oxygen used during your procedure.  There is no need for concern and it should clear up in a day or so.  SYMPTOMS TO REPORT IMMEDIATELY:  Following lower endoscopy (colonoscopy):  Excessive amounts of blood in the stool  Significant tenderness or worsening of abdominal pains  Swelling of the abdomen that is new, acute  Fever of 100F or higher  Following upper endoscopy (EGD)  Vomiting of blood or coffee ground material  New chest pain or pain under the shoulder blades  Painful or persistently difficult swallowing  New shortness of breath  Black, tarry-looking stools  For urgent or emergent issues, a gastroenterologist can be reached at any hour  by calling [phone]. Do not use MyChart messaging for urgent concerns.    DIET:  We do recommend a small meal at first, but then you may proceed to your regular diet.  Drink plenty of fluids but you should avoid alcoholic beverages for 24 hours.  ACTIVITY:  You should plan to take it easy for the rest of today and you should NOT DRIVE or use heavy machinery until tomorrow (because of the sedation medicines used during the test).    FOLLOW UP: Our staff will call the number listed on your records TOMORROW to check on you and address any questions or concerns that you may have regarding the information given to you following your procedure. If we do not reach you, we will leave a message.  We will attempt to reach you two times.  During this call, we will ask if you have developed any symptoms of COVID 19. If you develop any symptoms (ie: fever, flu-like symptoms, shortness of breath, cough etc.) before then, please call [phone].  If you test positive for Covid 19 in the 2 weeks post procedure, please call and report this information to [place].    If any biopsies were taken you will be contacted by phone or by letter within the next 1-3 weeks.  Please call us at [phone] if you have not heard about the biopsies in 3 weeks.    SIGNATURES/CONFIDENTIALITY: You and/or your care partner have signed paperwork which will be entered into your electronic  medical record.  These signatures attest to the fact that that the information above on your After Visit Summary has been reviewed and is understood.  Full responsibility of the confidentiality of this discharge information lies with you and/or your care-partner.  

## 2021-09-11 ENCOUNTER — Telehealth: Payer: Self-pay

## 2021-09-11 ENCOUNTER — Other Ambulatory Visit: Payer: Self-pay

## 2021-09-11 DIAGNOSIS — D509 Iron deficiency anemia, unspecified: Secondary | ICD-10-CM

## 2021-09-11 NOTE — Telephone Encounter (Signed)
  Follow up Call-     09/10/2021    1:11 PM 04/05/2019    7:38 AM  Call back number  Post procedure Call Back phone  # [phone]  Permission to leave phone message Yes Yes     Patient questions:  Do you have a fever, pain , or abdominal swelling? No. Pain Score  0 *  Have you tolerated food without any problems? Yes.    Have you been able to return to your normal activities? Yes.    Do you have any questions about your discharge instructions: Diet   No. Medications  No. Follow up visit  No.  Do you have questions or concerns about your Care? No.  Actions: * If pain score is 4 or above: No action needed, pain <4.

## 2021-09-17 ENCOUNTER — Encounter: Payer: Self-pay | Admitting: Internal Medicine

## 2021-09-18 ENCOUNTER — Ambulatory Visit: Payer: Medicare Other

## 2021-10-02 ENCOUNTER — Other Ambulatory Visit: Payer: Self-pay | Admitting: Internal Medicine

## 2021-10-02 ENCOUNTER — Ambulatory Visit (INDEPENDENT_AMBULATORY_CARE_PROVIDER_SITE_OTHER): Payer: Medicare Other

## 2021-10-02 DIAGNOSIS — Z0000 Encounter for general adult medical examination without abnormal findings: Secondary | ICD-10-CM | POA: Diagnosis not present

## 2021-10-02 NOTE — Progress Notes (Signed)
I connected with Shelly Flatten today by telephone and verified that I am speaking with the correct person using two identifiers. Location patient: home Location provider: work Persons participating in the virtual visit: patient, provider.   I discussed the limitations, risks, security and privacy concerns of performing an evaluation and management service by telephone and the availability of in person appointments. I also discussed with the patient that there may be a patient responsible charge related to this service. The patient expressed understanding and verbally consented to this telephonic visit.    Interactive audio and video telecommunications were attempted between this provider and patient, however failed, due to patient having technical difficulties OR patient did not have access to video capability.  We continued and completed visit with audio only.  Some vital signs may be absent or patient reported.   Time Spent with patient on telephone encounter: 30 minutes  Subjective:   [name] [name] is a 81 y.o. male who presents for Medicare Annual/Subsequent preventive examination.  Review of Systems     Cardiac Risk Factors include: advanced age (>59mn, >>60women);family history of premature cardiovascular disease;hypertension;dyslipidemia;male gender     Objective:    There were no vitals filed for this visit. There is no height or weight on file to calculate BMI.     10/02/2021   11:05 AM 09/11/2020   10:48 AM 05/20/2017    9:02 AM 11/11/2016    8:25 AM 02/19/2016    7:28 AM 10/08/2014   10:04 AM 03/10/2014    3:20 PM  Advanced Directives  Does Patient Have a Medical Advance Directive? No No No No No No No  Would patient like information on creating a medical advance directive? No - Patient declined Yes (MAU/Ambulatory/Procedural Areas - Information given)  No - Patient declined  No - patient declined information No - patient declined information    Current Medications  (verified) Outpatient Encounter Medications as of 10/02/2021  Medication Sig   acetaminophen (TYLENOL) 500 MG tablet Take 500 mg by mouth every 6 (six) hours as needed.   aspirin 81 MG tablet Take 81 mg by mouth daily.   azelastine (OPTIVAR) 0.05 % ophthalmic solution Place 1 drop into both eyes 2 (two) times daily.   cholecalciferol (VITAMIN D) 1000 UNITS tablet Take 4,000 Units by mouth daily.    clopidogrel (PLAVIX) 75 MG tablet TAKE 1 TABLET BY MOUTH DAILY   diclofenac Sodium (VOLTAREN) 1 % GEL Apply 1 application topically 4 (four) times daily.   halobetasol (ULTRAVATE) 0.05 % cream Apply topically 2 (two) times daily.   levothyroxine (SYNTHROID) 100 MCG tablet TAKE 1 TABLET BY MOUTH EVERY DAY   losartan (COZAAR) 100 MG tablet Take 1 tablet (100 mg total) by mouth daily.   metoprolol succinate (TOPROL-XL) 25 MG 24 hr tablet Take 1 tablet (25 mg total) by mouth daily.   Multiple Vitamins-Minerals (CENTRUM SILVER PO) Take 1 tablet by mouth daily.   neomycin-polymyxin-hydrocortisone (CORTISPORIN) OTIC solution Place 3 drops into both ears 3 (three) times daily.   niacin 500 MG CR capsule Take 2 capsules (1,000 mg total) by mouth at bedtime.   nitroGLYCERIN (NITROSTAT) 0.4 MG SL tablet Place 1 tablet (0.4 mg total) under the tongue every 5 (five) minutes as needed for chest pain (3 doses MAX).   Omega-3 Fatty Acids (FISH OIL) 1200 MG CAPS Take 1 capsule by mouth daily.   pantoprazole (PROTONIX) 40 MG tablet Take 1 tablet (40 mg total) by mouth in the morning.  potassium chloride (KLOR-CON) 10 MEQ tablet TAKE 1 TABLET BY MOUTH EVERY DAY   simvastatin (ZOCOR) 40 MG tablet TAKE 1 TABLET BY MOUTH DAILY   No facility-administered encounter medications on file as of 10/02/2021.    Allergies (verified) Patient has no known allergies.   History: Past Medical History:  Diagnosis Date   Allergic rhinitis    Allergy    Anemia    Arthritis    in neck   CAD (coronary artery disease)    Cataract     Had surg 08/2016   CERUMEN IMPACTION 06/04/2009   5/15     Clotting disorder ([place])    Colon polyp    Diverticulosis    Family hx of colon cancer    brother   Hyperlipemia    Hypertension    Hypothyroidism    MI (myocardial infarction) ([place]) 1993/2007   no stents   OSA (obstructive sleep apnea)    Psoriasis    Sleep apnea    no cpap- had sleep study per pt no OSA   Past Surgical History:  Procedure Laterality Date   CARDIAC CATHETERIZATION  1993   COLONOSCOPY     POLYPECTOMY     PTCA     TONSILLECTOMY     Family History  Problem Relation Age of Onset   Coronary artery disease Mother    Heart disease Mother    COPD Father    Colon polyps Sister        x2   Colon cancer Brother        died 43   Cancer Brother    Bone cancer Brother    Colon cancer Maternal Aunt    Colon cancer Maternal Aunt    Colon cancer Maternal Uncle    Colon cancer Paternal Aunt    Colon cancer Paternal Grandfather    Hypertension Other    Colon cancer Other    Esophageal cancer Neg Hx    Rectal cancer Neg Hx    Stomach cancer Neg Hx    Pancreatic cancer Neg Hx    Social History   Socioeconomic History   Marital status: Married    Spouse name: Not on file   Number of children: 2   Years of education: Not on file   Highest education level: Not on file  Occupational History   Occupation: retired    Fish farm manager: SELF-EMPLOYED Engineer, 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   Tobacco Use   Smoking status: Former    Types: Cigarettes    Quit date: 04/1978    Years since quitting: 43.4   Smokeless tobacco: Never  Vaping Use   Vaping Use: Never used  Substance and Sexual Activity   Alcohol use: Yes    Alcohol/week: 6.0 standard drinks of alcohol    Types: 6 Cans of beer per week   Drug use: No   Sexual activity: Not Currently  Other Topics Concern   Not on file  Social History Narrative   Not on file   Social Determinants of Health   Financial Resource Strain: Low Risk  (10/02/2021)    Overall Financial Resource Strain (CARDIA)    Difficulty of Paying Living Expenses: Not hard at all  Food Insecurity: No Food Insecurity (09/11/2020)   Hunger Vital Sign    Worried About Running Out of Food in the Last Year: Never true    [place] in the Last Year: Never true  Transportation Needs: No Transportation Needs (10/02/2021)  PRAPARE - Hydrologist (Medical): No    Lack of Transportation (Non-Medical): No  Physical Activity: Sufficiently Active (10/02/2021)   Exercise Vital Sign    Days of Exercise per Week: 5 days    Minutes of Exercise per Session: 30 min  Stress: No Stress Concern Present (10/02/2021)   [place]    Feeling of Stress : Not at all  Social Connections: [place] (10/02/2021)   Social Connection and Isolation Panel [NHANES]    Frequency of Communication with Friends and Family: More than three times a week    Frequency of Social Gatherings with Friends and Family: Once a week    Attends Religious Services: More than 4 times per year    Active Member of [employer] or Organizations: Yes    Attends Music therapist: More than 4 times per year    Marital Status: Married    Tobacco Counseling Counseling given: Not Answered   Clinical Intake:  Pre-visit preparation completed: Yes  Pain : No/denies pain     BMI - recorded: 26.26 Nutritional Status: BMI 25 -29 Overweight Nutritional Risks: None Diabetes: No  How often do you need to have someone help you when you read instructions, pamphlets, or other written materials from your doctor or pharmacy?: 1 - Never What is the last grade level you completed in school?: Bachelor's Degree  Diabetic? no  Interpreter Needed?: No  Information entered by :: Mayia Megill N. Chaniah Cisse, LPN.   Activities of Daily Living    10/02/2021   11:18 AM  In your present state of health, do you have any  difficulty performing the following activities:  Hearing? 0  Vision? 0  Difficulty concentrating or making decisions? 0  Walking or climbing stairs? 0  Dressing or bathing? 0  Doing errands, shopping? 0  Preparing Food and eating ? N  Using the Toilet? N  In the past six months, have you accidently leaked urine? N  Do you have problems with loss of bowel control? N  Managing your Medications? N  Managing your Finances? N  Housekeeping or managing your Housekeeping? N    Patient Care Team: Plotnikov, Evie Lacks, MD as PCP - General Josue Hector, MD as PCP - Cardiology (Cardiology) Josue Hector, MD (Cardiology) Sable Feil, MD as Attending Physician (Gastroenterology) Clent Jacks, MD (Ophthalmology) Hilarie Fredrickson, Lajuan Lines, MD as Consulting Physician (Gastroenterology)  Indicate any recent Medical Services you may have received from other than [facility] providers in the past year (date may be approximate).     Assessment:   This is a routine wellness examination for [name] [name].  Hearing/Vision screen Hearing Screening - Comments:: Patient has issues with tinnitus, but no hearing aids. Vision Screening - Comments:: Patient does wears readers; cataracts removed. Eye exam done by: Clent Jacks, MD.   Dietary issues and exercise activities discussed: Current Exercise Habits: Home exercise routine, Type of exercise: walking;treadmill, Time (Minutes): 30, Frequency (Times/Week): 5, Weekly Exercise (Minutes/Week): 150, Intensity: Moderate, Exercise limited by: orthopedic condition(s)   Goals Addressed             This Visit's Progress    My goal is to stay off of any assistive devices.  Continue to maintain my health by staying active and independent.        Depression Screen    10/02/2021   11:08 AM 04/16/2021   10:24 AM 09/11/2020   10:21 AM 04/01/2020  1:19 PM 03/01/2019    3:50 PM 11/30/2017    7:43 AM 11/11/2016    8:27 AM  PHQ 2/9 Scores  PHQ - 2 Score 0 0 0 0 0 0 0   PHQ- 9 Score  0         Fall Risk    10/02/2021   11:06 AM 04/16/2021   10:24 AM 09/11/2020   10:21 AM 04/01/2020    1:19 PM 03/01/2019    3:49 PM  [place] in the past year? 0 0 0 0 0  Number falls in past yr: 0 0 0 0   Injury with Fall? 0 0 0 0   Risk for fall due to : No Fall Risks No Fall Risks No Fall Risks    Follow up Falls evaluation completed  Falls evaluation completed  Falls evaluation completed    [place]:  Any stairs in or around the home? Yes  If so, are there any without handrails? No  Home free of loose throw rugs in walkways, pet beds, electrical cords, etc? Yes  Adequate lighting in your home to reduce risk of falls? Yes   ASSISTIVE DEVICES UTILIZED TO PREVENT FALLS:  Life alert? No  Use of a cane, walker or w/c? No  Grab bars in the bathroom? No  Shower chair or bench in shower? No  Elevated toilet seat or a handicapped toilet? No   TIMED UP AND GO:  Was the test performed? No .  Length of time to ambulate 10 feet: n/a sec.   Appearance of gait: Patient not evaluated for gait during this visit.  Cognitive Function:        10/02/2021   11:19 AM  6CIT Screen  What Year? 0 points  What month? 0 points  What time? 0 points  Count back from 20 0 points  Months in reverse 0 points  Repeat phrase 0 points  Total Score 0 points    Immunizations Immunization History  Administered Date(s) Administered   Fluad Quad(high Dose 65+) 03/01/2019, 03/27/2020, 03/12/2021   Influenza Whole 04/14/2007, 06/05/2010   Influenza, High Dose Seasonal PF 05/14/2016, 02/26/2017   Influenza,inj,Quad PF,6+ Mos 05/16/2014   Influenza,trivalent, recombinat, inj, PF 12/22/2017   PFIZER(Purple Top)SARS-COV-2 Vaccination 05/07/2019, 05/27/2019   Pneumococcal Conjugate-13 08/28/2013   Pneumococcal Polysaccharide-23 06/04/2009   Td 08/16/2011   Tdap 10/21/2016   Zoster Recombinat (Shingrix) 03/11/2017, 05/24/2017   Zoster,  Live 06/05/2010    TDAP status: Up to date  Flu Vaccine status: Up to date  Pneumococcal vaccine status: Up to date  Covid-19 vaccine status: Completed vaccines  Qualifies for Shingles Vaccine? Yes   Zostavax completed Yes   Shingrix Completed?: Yes  Screening Tests Health Maintenance  Topic Date Due   COVID-19 Vaccine (3 - Pfizer risk series) 06/24/2019   INFLUENZA VACCINE  11/24/2021   COLONOSCOPY (Pts 45-24yr Insurance coverage will need to be confirmed)  09/11/2026   TETANUS/TDAP  10/22/2026   Pneumonia Vaccine 81 Years old  Completed   Zoster Vaccines- Shingrix  Completed   HPV VACCINES  Aged Out    Health Maintenance  Health Maintenance Due  Topic Date Due   COVID-19 Vaccine (3 - Pfizer risk series) 06/24/2019    Colorectal cancer screening: Type of screening: Colonoscopy. Completed 09/10/2021. Repeat every 5 years  Lung Cancer Screening: (Low Dose CT Chest recommended if Age 81-80years, 30 pack-year currently smoking OR have quit w/in 15years.) does  not qualify.   Lung Cancer Screening Referral: no  Additional Screening:  Hepatitis C Screening: does not qualify; Completed no  Vision Screening: Recommended annual ophthalmology exams for early detection of glaucoma and other disorders of the eye. Is the patient up to date with their annual eye exam?  Yes  Who is the provider or what is the name of the office in which the patient attends annual eye exams? Clent Jacks, MD. If pt is not established with a provider, would they like to be referred to a provider to establish care? No .   Dental Screening: Recommended annual dental exams for proper oral hygiene  Community Resource Referral / Chronic Care Management: CRR required this visit?  No   CCM required this visit?  No      Plan:     I have personally reviewed and noted the following in the patient's chart:   Medical and social history Use of alcohol, tobacco or illicit drugs  Current  medications and supplements including opioid prescriptions. Patient is not currently taking opioid prescriptions. Functional ability and status Nutritional status Physical activity Advanced directives List of other physicians Hospitalizations, surgeries, and ER visits in previous 12 months Vitals Screenings to include cognitive, depression, and falls Referrals and appointments  In addition, I have reviewed and discussed with patient certain preventive protocols, quality metrics, and best practice recommendations. A written personalized care plan for preventive services as well as general preventive health recommendations were provided to patient.     Sheral Flow, LPN   12/27/4266   Nurse Notes:  There were no vitals filed for this visit. There is no height or weight on file to calculate BMI. Patient stated that he has no issues with gait or balance; does not use any assistive devices.

## 2021-10-02 NOTE — Patient Instructions (Signed)
[name] , Thank you for taking time to come for your Medicare Wellness Visit. I appreciate your ongoing commitment to your health goals. Please review the following plan we discussed and let me know if I can assist you in the future.   Screening recommendations/referrals: Colonoscopy: 09/10/2021; due every 5 years Recommended yearly ophthalmology/optometry visit for glaucoma screening and checkup Recommended yearly dental visit for hygiene and checkup  Vaccinations: Influenza vaccine: 03/12/2021 Pneumococcal vaccine: 06/04/2009, 08/28/2013 Tdap vaccine: 10/21/2016; due every 10 years Shingles vaccine: 03/11/2017, 05/24/2017   Covid-19: 05/07/2019, 05/09/2019  Advanced directives: No  Conditions/risks identified: Yes  Next appointment: Please schedule your next Medicare Wellness Visit with your Nurse Health Advisor in 1 year by calling [phone].  Preventive Care 63 Years and Older, Male Preventive care refers to lifestyle choices and visits with your health care provider that can promote health and wellness. What does preventive care include? A yearly physical exam. This is also called an annual well check. Dental exams once or twice a year. Routine eye exams. Ask your health care provider how often you should have your eyes checked. Personal lifestyle choices, including: Daily care of your teeth and gums. Regular physical activity. Eating a healthy diet. Avoiding tobacco and drug use. Limiting alcohol use. Practicing safe sex. Taking low doses of aspirin every day. Taking vitamin and mineral supplements as recommended by your health care provider. What happens during an annual well check? The services and screenings done by your health care provider during your annual well check will depend on your age, overall health, lifestyle risk factors, and family history of disease. Counseling  Your health care provider may ask you questions about your: Alcohol use. Tobacco use. Drug  use. Emotional well-being. Home and relationship well-being. Sexual activity. Eating habits. History of falls. Memory and ability to understand (cognition). Work and work Statistician. Screening  You may have the following tests or measurements: Height, weight, and BMI. Blood pressure. Lipid and cholesterol levels. These may be checked every 5 years, or more frequently if you are over 55 years old. Skin check. Lung cancer screening. You may have this screening every year starting at age 5 if you have a 30-pack-year history of smoking and currently smoke or have quit within the past 15 years. Fecal occult blood test (FOBT) of the stool. You may have this test every year starting at age 33. Flexible sigmoidoscopy or colonoscopy. You may have a sigmoidoscopy every 5 years or a colonoscopy every 10 years starting at age 63. Prostate cancer screening. Recommendations will vary depending on your family history and other risks. Hepatitis C blood test. Hepatitis B blood test. Sexually transmitted disease (STD) testing. Diabetes screening. This is done by checking your blood sugar (glucose) after you have not eaten for a while (fasting). You may have this done every 1-3 years. Abdominal aortic aneurysm (AAA) screening. You may need this if you are a current or former smoker. Osteoporosis. You may be screened starting at age 52 if you are at high risk. Talk with your health care provider about your test results, treatment options, and if necessary, the need for more tests. Vaccines  Your health care provider may recommend certain vaccines, such as: Influenza vaccine. This is recommended every year. Tetanus, diphtheria, and acellular pertussis (Tdap, Td) vaccine. You may need a Td booster every 10 years. Zoster vaccine. You may need this after age 56. Pneumococcal 13-valent conjugate (PCV13) vaccine. One dose is recommended after age 70. Pneumococcal polysaccharide (PPSV23) vaccine. One  dose is  recommended after age 47. Talk to your health care provider about which screenings and vaccines you need and how often you need them. This information is not intended to replace advice given to you by your health care provider. Make sure you discuss any questions you have with your health care provider. Document Released: 05/09/2015 Document Revised: 12/31/2015 Document Reviewed: 02/11/2015 Elsevier Interactive Patient Education  2017 [place] Prevention in the Home Falls can cause injuries. They can happen to people of all ages. There are many things you can do to make your home safe and to help prevent falls. What can I do on the outside of my home? Regularly fix the edges of walkways and driveways and fix any cracks. Remove anything that might make you trip as you walk through a door, such as a raised step or threshold. Trim any bushes or trees on the path to your home. Use bright outdoor lighting. Clear any walking paths of anything that might make someone trip, such as rocks or tools. Regularly check to see if handrails are loose or broken. Make sure that both sides of any steps have handrails. Any raised decks and porches should have guardrails on the edges. Have any leaves, snow, or ice cleared regularly. Use sand or salt on walking paths during winter. Clean up any spills in your garage right away. This includes oil or grease spills. What can I do in the bathroom? Use night lights. Install grab bars by the toilet and in the tub and shower. Do not use towel bars as grab bars. Use non-skid mats or decals in the tub or shower. If you need to sit down in the shower, use a plastic, non-slip stool. Keep the floor dry. Clean up any water that spills on the floor as soon as it happens. Remove soap buildup in the tub or shower regularly. Attach bath mats securely with double-sided non-slip rug tape. Do not have throw rugs and other things on the floor that can make you  trip. What can I do in the bedroom? Use night lights. Make sure that you have a light by your bed that is easy to reach. Do not use any sheets or blankets that are too big for your bed. They should not hang down onto the floor. Have a firm chair that has side arms. You can use this for support while you get dressed. Do not have throw rugs and other things on the floor that can make you trip. What can I do in the kitchen? Clean up any spills right away. Avoid walking on wet floors. Keep items that you use a lot in easy-to-reach places. If you need to reach something above you, use a strong step stool that has a grab bar. Keep electrical cords out of the way. Do not use floor polish or wax that makes floors slippery. If you must use wax, use non-skid floor wax. Do not have throw rugs and other things on the floor that can make you trip. What can I do with my stairs? Do not leave any items on the stairs. Make sure that there are handrails on both sides of the stairs and use them. Fix handrails that are broken or loose. Make sure that handrails are as long as the stairways. Check any carpeting to make sure that it is firmly attached to the stairs. Fix any carpet that is loose or worn. Avoid having throw rugs at the top or bottom of the  stairs. If you do have throw rugs, attach them to the floor with carpet tape. Make sure that you have a light switch at the top of the stairs and the bottom of the stairs. If you do not have them, ask someone to add them for you. What else can I do to help prevent falls? Wear shoes that: Do not have high heels. Have rubber bottoms. Are comfortable and fit you well. Are closed at the toe. Do not wear sandals. If you use a stepladder: Make sure that it is fully opened. Do not climb a closed stepladder. Make sure that both sides of the stepladder are locked into place. Ask someone to hold it for you, if possible. Clearly mark and make sure that you can  see: Any grab bars or handrails. First and last steps. Where the edge of each step is. Use tools that help you move around (mobility aids) if they are needed. These include: Canes. Walkers. Scooters. Crutches. Turn on the lights when you go into a dark area. Replace any light bulbs as soon as they burn out. Set up your furniture so you have a clear path. Avoid moving your furniture around. If any of your floors are uneven, fix them. If there are any pets around you, be aware of where they are. Review your medicines with your doctor. Some medicines can make you feel dizzy. This can increase your chance of falling. Ask your doctor what other things that you can do to help prevent falls. This information is not intended to replace advice given to you by your health care provider. Make sure you discuss any questions you have with your health care provider. Document Released: 02/06/2009 Document Revised: 09/18/2015 Document Reviewed: 05/17/2014 Elsevier Interactive Patient Education  2017 [employer].

## 2021-11-03 DIAGNOSIS — Z961 Presence of intraocular lens: Secondary | ICD-10-CM | POA: Diagnosis not present

## 2021-11-03 DIAGNOSIS — H02834 Dermatochalasis of left upper eyelid: Secondary | ICD-10-CM | POA: Diagnosis not present

## 2021-11-03 DIAGNOSIS — H353111 Nonexudative age-related macular degeneration, right eye, early dry stage: Secondary | ICD-10-CM | POA: Diagnosis not present

## 2021-11-03 DIAGNOSIS — H26493 Other secondary cataract, bilateral: Secondary | ICD-10-CM | POA: Diagnosis not present

## 2021-11-03 DIAGNOSIS — H02831 Dermatochalasis of right upper eyelid: Secondary | ICD-10-CM | POA: Diagnosis not present

## 2021-12-01 DIAGNOSIS — L570 Actinic keratosis: Secondary | ICD-10-CM | POA: Diagnosis not present

## 2021-12-01 DIAGNOSIS — X32XXXA Exposure to sunlight, initial encounter: Secondary | ICD-10-CM | POA: Diagnosis not present

## 2021-12-01 DIAGNOSIS — L821 Other seborrheic keratosis: Secondary | ICD-10-CM | POA: Diagnosis not present

## 2021-12-01 DIAGNOSIS — R208 Other disturbances of skin sensation: Secondary | ICD-10-CM | POA: Diagnosis not present

## 2021-12-11 ENCOUNTER — Other Ambulatory Visit (INDEPENDENT_AMBULATORY_CARE_PROVIDER_SITE_OTHER): Payer: Medicare Other

## 2021-12-11 ENCOUNTER — Other Ambulatory Visit: Payer: Self-pay | Admitting: Internal Medicine

## 2021-12-11 ENCOUNTER — Ambulatory Visit (INDEPENDENT_AMBULATORY_CARE_PROVIDER_SITE_OTHER): Payer: Medicare Other | Admitting: Nurse Practitioner

## 2021-12-11 ENCOUNTER — Encounter: Payer: Self-pay | Admitting: Nurse Practitioner

## 2021-12-11 VITALS — BP 120/60 | HR 55 | Ht 72.5 in | Wt 198.5 lb

## 2021-12-11 DIAGNOSIS — I2510 Atherosclerotic heart disease of native coronary artery without angina pectoris: Secondary | ICD-10-CM | POA: Diagnosis not present

## 2021-12-11 DIAGNOSIS — D509 Iron deficiency anemia, unspecified: Secondary | ICD-10-CM

## 2021-12-11 DIAGNOSIS — K5520 Angiodysplasia of colon without hemorrhage: Secondary | ICD-10-CM | POA: Insufficient documentation

## 2021-12-11 DIAGNOSIS — D500 Iron deficiency anemia secondary to blood loss (chronic): Secondary | ICD-10-CM

## 2021-12-11 LAB — IBC + FERRITIN
Ferritin: 14.1 ng/mL — ABNORMAL LOW (ref 22.0–322.0)
Iron: 46 ug/dL (ref 42–165)
Saturation Ratios: 8.5 % — ABNORMAL LOW (ref 20.0–50.0)
TIBC: 543.2 ug/dL — ABNORMAL HIGH (ref 250.0–450.0)
Transferrin: 388 mg/dL — ABNORMAL HIGH (ref 212.0–360.0)

## 2021-12-11 LAB — CBC WITH DIFFERENTIAL/PLATELET
Basophils Absolute: 0 10*3/uL (ref 0.0–0.1)
Basophils Relative: 0.6 % (ref 0.0–3.0)
Eosinophils Absolute: 0.2 10*3/uL (ref 0.0–0.7)
Eosinophils Relative: 3.5 % (ref 0.0–5.0)
HCT: 40.6 % (ref 39.0–52.0)
Hemoglobin: 13.3 g/dL (ref 13.0–17.0)
Lymphocytes Relative: 27.9 % (ref 12.0–46.0)
Lymphs Abs: 1.7 10*3/uL (ref 0.7–4.0)
MCHC: 32.7 g/dL (ref 30.0–36.0)
MCV: 86.1 fl (ref 78.0–100.0)
Monocytes Absolute: 0.7 10*3/uL (ref 0.1–1.0)
Monocytes Relative: 11.6 % (ref 3.0–12.0)
Neutro Abs: 3.4 10*3/uL (ref 1.4–7.7)
Neutrophils Relative %: 56.4 % (ref 43.0–77.0)
Platelets: 208 10*3/uL (ref 150.0–400.0)
RBC: 4.72 Mil/uL (ref 4.22–5.81)
RDW: 17.4 % — ABNORMAL HIGH (ref 11.5–15.5)
WBC: 6.1 10*3/uL (ref 4.0–10.5)

## 2021-12-11 NOTE — Progress Notes (Signed)
Chief Complaint: Follow-up on iron deficiency anemia   Assessment &  Plan   #81 year old male on Plavix with new iron deficiency anemia in April. 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  Will repeat CBC, iron studies. He may benefit from staying on iron indefinitely to keep up with any GI blood loss. He seems to be tolerating the iron Continue daily PPI.  If today's labs don't show improvement or if he has recurrent IDA in future then consider repeat colonoscopy with treatment of AVMs.    HPI   [name] [name] is a 81 y.o. male known to Dr.  Hilarie [name] with a past medical history significant for X. See PMH /PSH for additional history  of adenomatous and sessile serrated colon polyps, family history of colon cancer, GERD with history of dysphagia due to hiatal hernia with Schatzki's ring, CAD on Plavix, hypertension, hyperlipidemia , COVID19 in March 2023 and hypothyroidism. Additional medical history as listed in [place]   [name] [name] was seen in April 2023 with new iron deficiency anemia.  He had also had some unintentional weight loss.  He had undergone EGD and polyp surveillance colonoscopy within the last 2 to 3 years.  However, due to the new iron deficiency anemia he was scheduled for repeat upper and lower endoscopies which were done in May (see findings below) .  To summarize he had a 5 cm hiatal hernia, scattered Cameron's erosions.  Colonoscopy showed multiple colonic angiectasis which was felt to be the source of IDA on Plavix.  A 4 mm rectal polyp was removed.  Postprocedure he was started on iron.  Patient is here for follow-up   Interval History:  [name] [name] is doing well . He has been on iron since procedures in May.  His stools are dark brown.  He has not had any black stools or overt GI bleeding.  He is taking daily pantoprazole.   He was diarrhea when I saw him back in April but that has since resolved on iron.    Previous GI Evaluation   May 2023 EGD  Non-obstructing Schatzki ring. - 5 cm hiatal hernia. - Scattered Cameron's erosions (related to hiatal hernia). Nonbleeding today. - Normal stomach. Biopsied. - Normal examined duodenum. Biopsied.  May 2023 colonoscopy  - multiple colonic angioectasias (cecal and ascending colon). This is the most likely source of iron deficiency anemia. - Diverticulosis in the sigmoid colon and in the descending colon. - One 4 mm polyp in the rectum, removed with a cold snare. Resected and retrieved. - The distal rectum and anal verge are normal on retroflexion view.    Labs:     Latest Ref Rng & Units 08/05/2021   10:41 AM 07/02/2021    8:53 AM 04/01/2020    1:52 PM  CBC  WBC 4.0 - 10.5 K/uL 6.3  6.1  8.0   Hemoglobin 13.0 - 17.0 g/dL 10.9  11.5  13.0   Hematocrit 39.0 - 52.0 % 33.6  36.5  39.5   Platelets 150.0 - 400.0 K/uL 365.0  266.0  246.0        Latest Ref Rng & Units 07/02/2021    8:53 AM 04/01/2020    1:52 PM 03/02/2019    7:27 AM  Hepatic Function  Total Protein 6.0 - 8.3 g/dL 6.7  7.8  6.7  Albumin 3.5 - 5.2 g/dL 4.4  4.7  4.4   AST 0 - 37 U/L '26  25  24   ' ALT 0 - 53 U/L '21  24  24   ' Alk Phosphatase 39 - 117 U/L 45  52  39   Total Bilirubin 0.2 - 1.2 mg/dL 1.0  1.0  0.9   Bilirubin, Direct 0.0 - 0.3 mg/dL   0.2      Past Medical History:  Diagnosis Date   Allergic rhinitis    Allergy    Anemia    Arthritis    in neck   CAD (coronary artery disease)    Cataract    Had surg 08/2016   CERUMEN IMPACTION 06/04/2009   5/15     Clotting disorder ([place])    Colon polyp    Diverticulosis    Family hx of colon cancer    brother   Hyperlipemia    Hypertension    Hypothyroidism    MI (myocardial infarction) ([place]) 1993/2007   no stents   OSA (obstructive sleep apnea)    Psoriasis    Sleep apnea    no cpap- had sleep study per pt no OSA    Past  Surgical History:  Procedure Laterality Date   CARDIAC CATHETERIZATION  1993   COLONOSCOPY     POLYPECTOMY     PTCA     TONSILLECTOMY      Current Medications, Allergies, Family History and Social History were reviewed in [employer] record.     Current Outpatient Medications  Medication Sig Dispense Refill   acetaminophen (TYLENOL) 500 MG tablet Take 500 mg by mouth every 6 (six) hours as needed.     aspirin 81 MG tablet Take 81 mg by mouth daily.     azelastine (OPTIVAR) 0.05 % ophthalmic solution Place 1 drop into both eyes 2 (two) times daily. 6 mL 6   cholecalciferol (VITAMIN D) 1000 UNITS tablet Take 4,000 Units by mouth daily.      clopidogrel (PLAVIX) 75 MG tablet TAKE 1 TABLET BY MOUTH DAILY 90 tablet 0   diclofenac Sodium (VOLTAREN) 1 % GEL Apply 1 application topically 4 (four) times daily. 100 g 3   halobetasol (ULTRAVATE) 0.05 % cream Apply topically 2 (two) times daily. 50 g 2   levothyroxine (SYNTHROID) 100 MCG tablet TAKE 1 TABLET BY MOUTH EVERY DAY 90 tablet 2   losartan (COZAAR) 100 MG tablet Take 1 tablet (100 mg total) by mouth daily. 90 tablet 3   metoprolol succinate (TOPROL-XL) 25 MG 24 hr tablet Take 1 tablet (25 mg total) by mouth daily. 90 tablet 3   Multiple Vitamins-Minerals (CENTRUM SILVER PO) Take 1 tablet by mouth daily.     niacin 500 MG CR capsule Take 2 capsules (1,000 mg total) by mouth at bedtime. 100 capsule 3   Omega-3 Fatty Acids (FISH OIL) 1200 MG CAPS Take 1 capsule by mouth daily.     pantoprazole (PROTONIX) 40 MG tablet Take 1 tablet (40 mg total) by mouth in the morning. 90 tablet 0   potassium chloride (KLOR-CON) 10 MEQ tablet TAKE 1 TABLET BY MOUTH EVERY DAY 90 tablet 3   simvastatin (ZOCOR) 40 MG tablet TAKE 1 TABLET BY MOUTH DAILY 90 tablet 3   nitroGLYCERIN (NITROSTAT) 0.4 MG SL tablet Place 1 tablet (0.4 mg total) under the tongue every 5 (five) minutes as needed for chest pain (3 doses MAX). (Patient not taking:  Reported on 12/11/2021) 25 tablet  4   No current facility-administered medications for this visit.    Review of Systems: No chest pain. No shortness of breath. No urinary complaints.    Physical Exam  Wt Readings from Last 3 Encounters:  12/11/21 198 lb 8 oz (90 kg)  09/10/21 199 lb (90.3 kg)  08/05/21 199 lb 4 oz (90.4 kg)    BP 120/60   Pulse (!) 55   Ht 6' 0.5" (1.842 m)   Wt 198 lb 8 oz (90 kg)   SpO2 98%   BMI 26.55 kg/m  Constitutional:  Generally well appearing male in no acute distress. Psychiatric: Pleasant. Normal mood and affect. Behavior is normal. EENT: Pupils normal.  Conjunctivae are normal. No scleral icterus. Neck supple.  Cardiovascular: Normal rate, regular rhythm. No edema Pulmonary/chest: Effort normal and breath sounds normal. No wheezing, rales or rhonchi. Abdominal: Soft, nondistended, nontender. Bowel sounds active throughout. There are no masses palpable. No hepatomegaly. Neurological: Alert and oriented to person place and time. Skin: Skin is warm and dry. No rashes noted.  Tye Savoy, NP  12/11/2021, 9:07 AM

## 2021-12-11 NOTE — Progress Notes (Signed)
Addendum: Reviewed and agree with assessment and management plan. See lab result note from repeat blood counts and iron studies Sending patient for IV iron Parris Signer, Lajuan Lines, MD

## 2021-12-11 NOTE — Patient Instructions (Signed)
If you are age 81 or older, your body mass index should be between 23-30. Your Body mass index is 26.55 kg/m. If this is out of the aforementioned range listed, please consider follow up with your Primary Care Provider. ________________________________________________________  The [facility] providers would like to encourage you to use [facility] to communicate with providers for non-urgent requests or questions.  Due to long hold times on the telephone, sending your provider a message by [facility] may be a faster and more efficient way to get a response.  Please allow 48 business hours for a response.  Please remember that this is for non-urgent requests.  _______________________________________________________  Your provider has requested that you go to the basement level for lab work before leaving today. Press "B" on the elevator. The lab is located at the first door on the left as you exit the elevator.  Follow up pending  Thank you for entrusting me with your care and choosing [facility]  Tye Savoy, NP-C

## 2021-12-14 ENCOUNTER — Other Ambulatory Visit: Payer: Self-pay

## 2021-12-14 ENCOUNTER — Telehealth: Payer: Self-pay | Admitting: Pharmacy Technician

## 2021-12-14 DIAGNOSIS — D500 Iron deficiency anemia secondary to blood loss (chronic): Secondary | ICD-10-CM

## 2021-12-14 NOTE — Telephone Encounter (Signed)
Auth Submission: approved/denied: NO AUTH NEEDED Payer: Medicare a/b & BCBS Supplement Medication & CPT/J Code(s) submitted: Feraheme (ferumoxytol) Y3888  Approval from: 12/14/21 to 02/13/22

## 2021-12-17 ENCOUNTER — Ambulatory Visit (INDEPENDENT_AMBULATORY_CARE_PROVIDER_SITE_OTHER): Payer: Medicare Other

## 2021-12-17 VITALS — BP 149/71 | HR 51 | Temp 98.1°F | Resp 18 | Ht 74.0 in | Wt 198.8 lb

## 2021-12-17 DIAGNOSIS — K5520 Angiodysplasia of colon without hemorrhage: Secondary | ICD-10-CM

## 2021-12-17 DIAGNOSIS — D500 Iron deficiency anemia secondary to blood loss (chronic): Secondary | ICD-10-CM | POA: Diagnosis not present

## 2021-12-17 MED ORDER — SODIUM CHLORIDE 0.9 % IV SOLN
510.0000 mg | Freq: Once | INTRAVENOUS | Status: AC
  Administered 2021-12-17: 510 mg via INTRAVENOUS
  Filled 2021-12-17: qty 17

## 2021-12-17 NOTE — Progress Notes (Signed)
Diagnosis: Iron Deficiency Anemia  Provider:  Marshell Garfinkel MD  Procedure: Infusion  IV Type: Peripheral, IV Location: R Forearm  Feraheme (Ferumoxytol), Dose: 510 mg  Infusion Start Time: 1004  Infusion Stop Time: 1022  Post Infusion IV Care: Observation period completed and Peripheral IV Discontinued  Discharge: Condition: Good, Destination: Home . AVS provided to patient.   Performed by:  Cleophus Molt, RN

## 2021-12-17 NOTE — Patient Instructions (Signed)

## 2021-12-19 ENCOUNTER — Other Ambulatory Visit: Payer: Self-pay | Admitting: Internal Medicine

## 2021-12-20 ENCOUNTER — Other Ambulatory Visit: Payer: Self-pay | Admitting: Internal Medicine

## 2021-12-24 ENCOUNTER — Ambulatory Visit (INDEPENDENT_AMBULATORY_CARE_PROVIDER_SITE_OTHER): Payer: Medicare Other

## 2021-12-24 VITALS — BP 156/88 | HR 51 | Temp 97.6°F | Resp 18 | Ht 74.0 in | Wt 199.6 lb

## 2021-12-24 DIAGNOSIS — K5520 Angiodysplasia of colon without hemorrhage: Secondary | ICD-10-CM

## 2021-12-24 DIAGNOSIS — D500 Iron deficiency anemia secondary to blood loss (chronic): Secondary | ICD-10-CM | POA: Diagnosis not present

## 2021-12-24 MED ORDER — SODIUM CHLORIDE 0.9 % IV SOLN
510.0000 mg | Freq: Once | INTRAVENOUS | Status: AC
  Administered 2021-12-24: 510 mg via INTRAVENOUS
  Filled 2021-12-24: qty 17

## 2021-12-24 NOTE — Progress Notes (Signed)
Diagnosis: Iron Deficiency Anemia  Provider:  Marshell Garfinkel MD  Procedure: Infusion  IV Type: Peripheral, IV Location: R Forearm  Feraheme (Ferumoxytol), Dose: 510 mg  Infusion Start Time: 8406  Infusion Stop Time: 0900  Post Infusion IV Care: Peripheral IV Discontinued  Discharge: Condition: Good, Destination: Home . AVS provided to patient.   Performed by:  Cleophus Molt, RN

## 2022-03-10 ENCOUNTER — Ambulatory Visit (INDEPENDENT_AMBULATORY_CARE_PROVIDER_SITE_OTHER): Payer: Medicare Other | Admitting: *Deleted

## 2022-03-10 ENCOUNTER — Other Ambulatory Visit: Payer: Medicare Other

## 2022-03-10 DIAGNOSIS — Z23 Encounter for immunization: Secondary | ICD-10-CM

## 2022-03-17 ENCOUNTER — Other Ambulatory Visit: Payer: Self-pay

## 2022-03-22 ENCOUNTER — Other Ambulatory Visit (INDEPENDENT_AMBULATORY_CARE_PROVIDER_SITE_OTHER): Payer: Medicare Other

## 2022-03-22 DIAGNOSIS — D500 Iron deficiency anemia secondary to blood loss (chronic): Secondary | ICD-10-CM | POA: Diagnosis not present

## 2022-03-22 LAB — IBC + FERRITIN
Ferritin: 86.1 ng/mL (ref 22.0–322.0)
Iron: 109 ug/dL (ref 42–165)
Saturation Ratios: 28.7 % (ref 20.0–50.0)
TIBC: 379.4 ug/dL (ref 250.0–450.0)
Transferrin: 271 mg/dL (ref 212.0–360.0)

## 2022-03-22 LAB — CBC WITH DIFFERENTIAL/PLATELET
Basophils Absolute: 0.1 10*3/uL (ref 0.0–0.1)
Basophils Relative: 0.8 % (ref 0.0–3.0)
Eosinophils Absolute: 0.5 10*3/uL (ref 0.0–0.7)
Eosinophils Relative: 6.5 % — ABNORMAL HIGH (ref 0.0–5.0)
HCT: 43.8 % (ref 39.0–52.0)
Hemoglobin: 14.7 g/dL (ref 13.0–17.0)
Lymphocytes Relative: 27 % (ref 12.0–46.0)
Lymphs Abs: 1.9 10*3/uL (ref 0.7–4.0)
MCHC: 33.6 g/dL (ref 30.0–36.0)
MCV: 92.4 fl (ref 78.0–100.0)
Monocytes Absolute: 0.7 10*3/uL (ref 0.1–1.0)
Monocytes Relative: 10.3 % (ref 3.0–12.0)
Neutro Abs: 3.9 10*3/uL (ref 1.4–7.7)
Neutrophils Relative %: 55.4 % (ref 43.0–77.0)
Platelets: 231 10*3/uL (ref 150.0–400.0)
RBC: 4.74 Mil/uL (ref 4.22–5.81)
RDW: 14.1 % (ref 11.5–15.5)
WBC: 7 10*3/uL (ref 4.0–10.5)

## 2022-03-23 NOTE — Progress Notes (Signed)
plp

## 2022-04-21 ENCOUNTER — Ambulatory Visit (INDEPENDENT_AMBULATORY_CARE_PROVIDER_SITE_OTHER): Payer: Medicare Other | Admitting: Family Medicine

## 2022-04-21 ENCOUNTER — Encounter: Payer: Self-pay | Admitting: Family Medicine

## 2022-04-21 ENCOUNTER — Ambulatory Visit (INDEPENDENT_AMBULATORY_CARE_PROVIDER_SITE_OTHER): Payer: Medicare Other

## 2022-04-21 VITALS — BP 118/60 | HR 57 | Temp 97.7°F | Ht 74.0 in | Wt 209.0 lb

## 2022-04-21 DIAGNOSIS — L03113 Cellulitis of right upper limb: Secondary | ICD-10-CM

## 2022-04-21 DIAGNOSIS — M25521 Pain in right elbow: Secondary | ICD-10-CM

## 2022-04-21 DIAGNOSIS — M7989 Other specified soft tissue disorders: Secondary | ICD-10-CM | POA: Diagnosis not present

## 2022-04-21 DIAGNOSIS — M25421 Effusion, right elbow: Secondary | ICD-10-CM

## 2022-04-21 DIAGNOSIS — I2510 Atherosclerotic heart disease of native coronary artery without angina pectoris: Secondary | ICD-10-CM | POA: Diagnosis not present

## 2022-04-21 LAB — CBC WITH DIFFERENTIAL/PLATELET
Basophils Absolute: 0.1 10*3/uL (ref 0.0–0.1)
Basophils Relative: 1.1 % (ref 0.0–3.0)
Eosinophils Absolute: 0.3 10*3/uL (ref 0.0–0.7)
Eosinophils Relative: 2.8 % (ref 0.0–5.0)
HCT: 42.5 % (ref 39.0–52.0)
Hemoglobin: 14.4 g/dL (ref 13.0–17.0)
Lymphocytes Relative: 19.8 % (ref 12.0–46.0)
Lymphs Abs: 1.8 10*3/uL (ref 0.7–4.0)
MCHC: 33.7 g/dL (ref 30.0–36.0)
MCV: 93 fl (ref 78.0–100.0)
Monocytes Absolute: 1.2 10*3/uL — ABNORMAL HIGH (ref 0.1–1.0)
Monocytes Relative: 13.2 % — ABNORMAL HIGH (ref 3.0–12.0)
Neutro Abs: 5.8 10*3/uL (ref 1.4–7.7)
Neutrophils Relative %: 63.1 % (ref 43.0–77.0)
Platelets: 226 10*3/uL (ref 150.0–400.0)
RBC: 4.57 Mil/uL (ref 4.22–5.81)
RDW: 13.3 % (ref 11.5–15.5)
WBC: 9.2 10*3/uL (ref 4.0–10.5)

## 2022-04-21 LAB — URIC ACID: Uric Acid, Serum: 5.9 mg/dL (ref 4.0–7.8)

## 2022-04-21 MED ORDER — SULFAMETHOXAZOLE-TRIMETHOPRIM 800-160 MG PO TABS: 1.0000 | ORAL_TABLET | Freq: Two times a day (BID) | ORAL | 0 refills | Status: DC

## 2022-04-21 NOTE — Progress Notes (Signed)
Subjective:     Patient ID: [name] [name], male    DOB: Jan 11, 1941, 81 y.o.   MRN: [id number]  No chief complaint on file.   HPI Patient is in today for a 24 hour hx of redness, swelling and pain of right elbow. No injury or insect bite.  No fever, chills, N/V. No other arthralgias or myalgias.   Possible hx of gout in left hand.    There are no preventive care reminders to display for this patient.  Past Medical History:  Diagnosis Date   Allergic rhinitis    Allergy    Anemia    Arthritis    in neck   CAD (coronary artery disease)    Cataract    Had surg 08/2016   CERUMEN IMPACTION 06/04/2009   5/15     Clotting disorder ([place])    Colon polyp    Diverticulosis    Family hx of colon cancer    brother   Hyperlipemia    Hypertension    Hypothyroidism    MI (myocardial infarction) ([place]) 1993/2007   no stents   OSA (obstructive sleep apnea)    Psoriasis    Sleep apnea    no cpap- had sleep study per pt no OSA    Past Surgical History:  Procedure Laterality Date   CARDIAC CATHETERIZATION  1993   COLONOSCOPY     POLYPECTOMY     PTCA     TONSILLECTOMY      Family History  Problem Relation Age of Onset   Coronary artery disease Mother    Heart disease Mother    COPD Father    Colon polyps Sister        x2   Colon cancer Brother        died 7   Cancer Brother    Bone cancer Brother    Colon cancer Maternal Aunt    Colon cancer Maternal Aunt    Colon cancer Maternal Uncle    Colon cancer Paternal Aunt    Colon cancer Paternal Grandfather    Hypertension Other    Colon cancer Other    Esophageal cancer Neg Hx    Rectal cancer Neg Hx    Stomach cancer Neg Hx    Pancreatic cancer Neg Hx     Social History   Socioeconomic History   Marital status: Married    Spouse name: Not on file   Number of children: 2   Years of education: Not on file   Highest education level: Not on file  Occupational History   Occupation: retired    Fish farm manager:  SELF-EMPLOYED Engineer, 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   Tobacco Use   Smoking status: Former    Types: Cigarettes    Quit date: 04/1978    Years since quitting: 44.0   Smokeless tobacco: Never  Vaping Use   Vaping Use: Never used  Substance and Sexual Activity   Alcohol use: Yes    Alcohol/week: 6.0 standard drinks of alcohol    Types: 6 Cans of beer per week   Drug use: No   Sexual activity: Not Currently  Other Topics Concern   Not on file  Social History Narrative   Not on file   Social Determinants of Health   Financial Resource Strain: Low Risk  (10/02/2021)   Overall Financial Resource Strain (CARDIA)    Difficulty of Paying Living Expenses: Not hard at all  Food Insecurity: No Food Insecurity (09/11/2020)  Hunger Vital Sign    Worried About Running Out of Food in the Last Year: Never true    Ran Out of Food in the Last Year: Never true  Transportation Needs: No Transportation Needs (10/02/2021)   PRAPARE - Hydrologist (Medical): No    Lack of Transportation (Non-Medical): No  Physical Activity: Sufficiently Active (10/02/2021)   Exercise Vital Sign    Days of Exercise per Week: 5 days    Minutes of Exercise per Session: 30 min  Stress: No Stress Concern Present (10/02/2021)   [place]    Feeling of Stress : Not at all  Social Connections: [place] (10/02/2021)   Social Connection and Isolation Panel [NHANES]    Frequency of Communication with Friends and Family: More than three times a week    Frequency of Social Gatherings with Friends and Family: Once a week    Attends Religious Services: More than 4 times per year    Active Member of [employer] or Organizations: Yes    Attends Music therapist: More than 4 times per year    Marital Status: Married  Human resources officer Violence: Not At Risk (10/02/2021)   Humiliation, Afraid, Rape, and Kick questionnaire     Fear of Current or Ex-Partner: No    Emotionally Abused: No    Physically Abused: No    Sexually Abused: No    Outpatient Medications Prior to Visit  Medication Sig Dispense Refill   acetaminophen (TYLENOL) 500 MG tablet Take 500 mg by mouth every 6 (six) hours as needed.     aspirin 81 MG tablet Take 81 mg by mouth daily.     azelastine (OPTIVAR) 0.05 % ophthalmic solution Place 1 drop into both eyes 2 (two) times daily. 6 mL 6   cholecalciferol (VITAMIN D) 1000 UNITS tablet Take 4,000 Units by mouth daily.      clopidogrel (PLAVIX) 75 MG tablet TAKE 1 TABLET BY MOUTH EVERY DAY 90 tablet 1   diclofenac Sodium (VOLTAREN) 1 % GEL Apply 1 application topically 4 (four) times daily. 100 g 3   halobetasol (ULTRAVATE) 0.05 % cream Apply topically 2 (two) times daily. 50 g 2   levothyroxine (SYNTHROID) 100 MCG tablet TAKE 1 TABLET BY MOUTH EVERY DAY 90 tablet 2   losartan (COZAAR) 100 MG tablet Take 1 tablet (100 mg total) by mouth daily. 90 tablet 3   metoprolol succinate (TOPROL-XL) 25 MG 24 hr tablet Take 1 tablet (25 mg total) by mouth daily. 90 tablet 3   Multiple Vitamins-Minerals (CENTRUM SILVER PO) Take 1 tablet by mouth daily.     niacin 500 MG CR capsule Take 2 capsules (1,000 mg total) by mouth at bedtime. 100 capsule 3   nitroGLYCERIN (NITROSTAT) 0.4 MG SL tablet Place 1 tablet (0.4 mg total) under the tongue every 5 (five) minutes as needed for chest pain (3 doses MAX). 25 tablet 4   Omega-3 Fatty Acids (FISH OIL) 1200 MG CAPS Take 1 capsule by mouth daily.     pantoprazole (PROTONIX) 40 MG tablet TAKE 1 TABLET (40 MG TOTAL) BY MOUTH IN THE MORNING 90 tablet 1   potassium chloride (KLOR-CON) 10 MEQ tablet TAKE 1 TABLET BY MOUTH EVERY DAY 90 tablet 3   simvastatin (ZOCOR) 40 MG tablet TAKE 1 TABLET BY MOUTH DAILY 90 tablet 3   No facility-administered medications prior to visit.    No Known Allergies  ROS  Objective:    Physical Exam  BP 118/60 (BP Location: Left Arm,  Patient Position: Sitting, Cuff Size: Normal)   Pulse (!) 57   Temp 97.7 F (36.5 C) (Oral)   Ht '6\' 2"'$  (1.88 m)   Wt 209 lb (94.8 kg)   SpO2 95%   BMI 26.83 kg/m  Wt Readings from Last 3 Encounters:  04/21/22 209 lb (94.8 kg)  12/24/21 199 lb 9.6 oz (90.5 kg)  12/17/21 198 lb 12.8 oz (90.2 kg)       Right elbow with erythema and increased warmth, pain with movement and TTP. No fluctuance or drainage. Skin intact. RUE is neurovascularly intact.   Assessment & Plan:   Problem List Items Addressed This Visit   None Visit Diagnoses     Cellulitis of right upper extremity    -  Primary   Relevant Medications   sulfamethoxazole-trimethoprim (BACTRIM DS) 800-160 MG tablet   Other Relevant Orders   DG Elbow Complete Right (Completed)   CBC w/Diff (Completed)   Uric acid (Completed)   Pain and swelling of elbow, right       Relevant Orders   DG Elbow Complete Right (Completed)   CBC w/Diff (Completed)   Uric acid (Completed)      XR, CBC and uric acid ordered.  Bactrim prescribed.  He is not needing pain medication but may take Tylenol if he is worsening.  Follow up here in 2 days or sooner if needed.  I am having [name] start on sulfamethoxazole-trimethoprim. I am also having him maintain his aspirin, Multiple Vitamins-Minerals (CENTRUM SILVER PO), Fish Oil, cholecalciferol, nitroGLYCERIN, acetaminophen, azelastine, halobetasol, diclofenac Sodium, potassium chloride, losartan, metoprolol succinate, simvastatin, niacin, levothyroxine, pantoprazole, and clopidogrel.  Meds ordered this encounter  Medications   sulfamethoxazole-trimethoprim (BACTRIM DS) 800-160 MG tablet    Sig: Take 1 tablet by mouth 2 (two) times daily.    Dispense:  20 tablet    Refill:  0    Order Specific Question:   Supervising Provider    Answer:   Pricilla Holm A [[id number]]

## 2022-04-21 NOTE — Patient Instructions (Signed)
You have an infection of your elbow.   Please go downstairs for an X ray and labs.   Start the antibiotic today and finish it.   Follow up if you are getting worse or if you develop fever, chills, nausea, vomiting or any other concerning symptoms.     Cellulitis, Adult  Cellulitis is a skin infection. The infected area is usually warm, red, swollen, and tender. This condition occurs most often in the arms and lower legs. The infection can travel to the muscles, blood, and underlying tissue and become serious. It is very important to get treated for this condition. What are the causes? Cellulitis is caused by bacteria. The bacteria enter through a break in the skin, such as a cut, burn, insect bite, open sore, or crack. What increases the risk? This condition is more likely to occur in people who: Have a weak body defense system (immune system). Have open wounds on the skin, such as cuts, burns, bites, and scrapes. Bacteria can enter the body through these open wounds. Are older than 81 years of age. Have diabetes. Have a type of long-lasting (chronic) liver disease (cirrhosis) or kidney disease. Are obese. Have a skin condition such as: Itchy rash (eczema). Slow movement of blood in the veins (venous stasis). Fluid buildup below the skin (edema). Have had radiation therapy. Use IV drugs. What are the signs or symptoms? Symptoms of this condition include: Redness, streaking, or spotting on the skin. Swollen area of the skin. Tenderness or pain when an area of the skin is touched. Warm skin. A fever. Chills. Blisters. How is this diagnosed? This condition is diagnosed based on a medical history and physical exam. You may also have tests, including: Blood tests. Imaging tests. How is this treated? Treatment for this condition may include: Medicines, such as antibiotic medicines or medicines to treat allergies (antihistamines). Supportive care, such as rest and application of  cold or warm cloths (compresses) to the skin. Hospital care, if the condition is severe. The infection usually starts to get better within 1-2 days of treatment. Follow these instructions at home:  Medicines Take over-the-counter and prescription medicines only as told by your health care provider. If you were prescribed an antibiotic medicine, take it as told by your health care provider. Do not stop taking the antibiotic even if you start to feel better. General instructions Drink enough fluid to keep your urine pale yellow. Do not touch or rub the infected area. Raise (elevate) the infected area above the level of your heart while you are sitting or lying down. Apply warm or cold compresses to the affected area as told by your health care provider. Keep all follow-up visits as told by your health care provider. This is important. These visits let your health care provider make sure a more serious infection is not developing. Contact a health care provider if: You have a fever. Your symptoms do not begin to improve within 1-2 days of starting treatment. Your bone or joint underneath the infected area becomes painful after the skin has healed. Your infection returns in the same area or another area. You notice a swollen bump in the infected area. You develop new symptoms. You have a general ill feeling (malaise) with muscle aches and pains. Get help right away if: Your symptoms get worse. You feel very sleepy. You develop vomiting or diarrhea that persists. You notice red streaks coming from the infected area. Your red area gets larger or turns dark in color.  These symptoms may represent a serious problem that is an emergency. Do not wait to see if the symptoms will go away. Get medical help right away. Call your local emergency services (911 in the [place] Do not drive yourself to the hospital. Summary Cellulitis is a skin infection. This condition occurs most often in the arms and  lower legs. Treatment for this condition may include medicines, such as antibiotic medicines or antihistamines. Take over-the-counter and prescription medicines only as told by your health care provider. If you were prescribed an antibiotic medicine, do not stop taking the antibiotic even if you start to feel better. Contact a health care provider if your symptoms do not begin to improve within 1-2 days of starting treatment or your symptoms get worse. Keep all follow-up visits as told by your health care provider. This is important. These visits let your health care provider make sure that a more serious infection is not developing. This information is not intended to replace advice given to you by your health care provider. Make sure you discuss any questions you have with your health care provider. Document Revised: 01/21/2021 Document Reviewed: 01/22/2021 Elsevier Patient Education  [employer].

## 2022-04-23 ENCOUNTER — Ambulatory Visit (INDEPENDENT_AMBULATORY_CARE_PROVIDER_SITE_OTHER): Payer: Medicare Other | Admitting: Family Medicine

## 2022-04-23 ENCOUNTER — Encounter: Payer: Self-pay | Admitting: Family Medicine

## 2022-04-23 VITALS — BP 134/66 | HR 60 | Temp 97.5°F | Ht 74.0 in | Wt 207.0 lb

## 2022-04-23 DIAGNOSIS — L03113 Cellulitis of right upper limb: Secondary | ICD-10-CM | POA: Diagnosis not present

## 2022-04-23 DIAGNOSIS — I2510 Atherosclerotic heart disease of native coronary artery without angina pectoris: Secondary | ICD-10-CM

## 2022-04-23 NOTE — Progress Notes (Unsigned)
Subjective:     Patient ID: [name] [name], male    DOB: 09-07-1940, 81 y.o.   MRN: [id number]  Chief Complaint  Patient presents with   Follow-up    2 day f/u on elbow, looks to be getting better but still a little swollen    HPI Patient is in today for follow up on cellulitis of right elbow.   There are no preventive care reminders to display for this patient.  Past Medical History:  Diagnosis Date   Allergic rhinitis    Allergy    Anemia    Arthritis    in neck   CAD (coronary artery disease)    Cataract    Had surg 08/2016   CERUMEN IMPACTION 06/04/2009   5/15     Clotting disorder ([place])    Colon polyp    Diverticulosis    Family hx of colon cancer    brother   Hyperlipemia    Hypertension    Hypothyroidism    MI (myocardial infarction) ([place]) 1993/2007   no stents   OSA (obstructive sleep apnea)    Psoriasis    Sleep apnea    no cpap- had sleep study per pt no OSA    Past Surgical History:  Procedure Laterality Date   CARDIAC CATHETERIZATION  1993   COLONOSCOPY     POLYPECTOMY     PTCA     TONSILLECTOMY      Family History  Problem Relation Age of Onset   Coronary artery disease Mother    Heart disease Mother    COPD Father    Colon polyps Sister        x2   Colon cancer Brother        died 14   Cancer Brother    Bone cancer Brother    Colon cancer Maternal Aunt    Colon cancer Maternal Aunt    Colon cancer Maternal Uncle    Colon cancer Paternal Aunt    Colon cancer Paternal Grandfather    Hypertension Other    Colon cancer Other    Esophageal cancer Neg Hx    Rectal cancer Neg Hx    Stomach cancer Neg Hx    Pancreatic cancer Neg Hx     Social History   Socioeconomic History   Marital status: Married    Spouse name: Not on file   Number of children: 2   Years of education: Not on file   Highest education level: Not on file  Occupational History   Occupation: retired    Fish farm manager: SELF-EMPLOYED Engineer, maintenance (IT)    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   Tobacco Use   Smoking status: Former    Types: Cigarettes    Quit date: 04/1978    Years since quitting: 44.0   Smokeless tobacco: Never  Vaping Use   Vaping Use: Never used  Substance and Sexual Activity   Alcohol use: Yes    Alcohol/week: 6.0 standard drinks of alcohol    Types: 6 Cans of beer per week   Drug use: No   Sexual activity: Not Currently  Other Topics Concern   Not on file  Social History Narrative   Not on file   Social Determinants of Health   Financial Resource Strain: Low Risk  (10/02/2021)   Overall Financial Resource Strain (CARDIA)    Difficulty of Paying Living Expenses: Not hard at all  Food Insecurity: No Food Insecurity (09/11/2020)   Hunger Vital Sign  Worried About Charity fundraiser in the Last Year: Never true    [place] in the Last Year: Never true  Transportation Needs: No Transportation Needs (10/02/2021)   PRAPARE - Hydrologist (Medical): No    Lack of Transportation (Non-Medical): No  Physical Activity: Sufficiently Active (10/02/2021)   Exercise Vital Sign    Days of Exercise per Week: 5 days    Minutes of Exercise per Session: 30 min  Stress: No Stress Concern Present (10/02/2021)   [place]    Feeling of Stress : Not at all  Social Connections: [place] (10/02/2021)   Social Connection and Isolation Panel [NHANES]    Frequency of Communication with Friends and Family: More than three times a week    Frequency of Social Gatherings with Friends and Family: Once a week    Attends Religious Services: More than 4 times per year    Active Member of [employer] or Organizations: Yes    Attends Music therapist: More than 4 times per year    Marital Status: Married  Human resources officer Violence: Not At Risk (10/02/2021)   Humiliation, Afraid, Rape, and Kick questionnaire    Fear of Current or Ex-Partner: No     Emotionally Abused: No    Physically Abused: No    Sexually Abused: No    Outpatient Medications Prior to Visit  Medication Sig Dispense Refill   acetaminophen (TYLENOL) 500 MG tablet Take 500 mg by mouth every 6 (six) hours as needed.     aspirin 81 MG tablet Take 81 mg by mouth daily.     azelastine (OPTIVAR) 0.05 % ophthalmic solution Place 1 drop into both eyes 2 (two) times daily. 6 mL 6   cholecalciferol (VITAMIN D) 1000 UNITS tablet Take 4,000 Units by mouth daily.      clopidogrel (PLAVIX) 75 MG tablet TAKE 1 TABLET BY MOUTH EVERY DAY 90 tablet 1   diclofenac Sodium (VOLTAREN) 1 % GEL Apply 1 application topically 4 (four) times daily. 100 g 3   halobetasol (ULTRAVATE) 0.05 % cream Apply topically 2 (two) times daily. 50 g 2   levothyroxine (SYNTHROID) 100 MCG tablet TAKE 1 TABLET BY MOUTH EVERY DAY 90 tablet 2   losartan (COZAAR) 100 MG tablet Take 1 tablet (100 mg total) by mouth daily. 90 tablet 3   metoprolol succinate (TOPROL-XL) 25 MG 24 hr tablet Take 1 tablet (25 mg total) by mouth daily. 90 tablet 3   Multiple Vitamins-Minerals (CENTRUM SILVER PO) Take 1 tablet by mouth daily.     niacin 500 MG CR capsule Take 2 capsules (1,000 mg total) by mouth at bedtime. 100 capsule 3   nitroGLYCERIN (NITROSTAT) 0.4 MG SL tablet Place 1 tablet (0.4 mg total) under the tongue every 5 (five) minutes as needed for chest pain (3 doses MAX). 25 tablet 4   Omega-3 Fatty Acids (FISH OIL) 1200 MG CAPS Take 1 capsule by mouth daily.     pantoprazole (PROTONIX) 40 MG tablet TAKE 1 TABLET (40 MG TOTAL) BY MOUTH IN THE MORNING 90 tablet 1   potassium chloride (KLOR-CON) 10 MEQ tablet TAKE 1 TABLET BY MOUTH EVERY DAY 90 tablet 3   simvastatin (ZOCOR) 40 MG tablet TAKE 1 TABLET BY MOUTH DAILY 90 tablet 3   sulfamethoxazole-trimethoprim (BACTRIM DS) 800-160 MG tablet Take 1 tablet by mouth 2 (two) times daily. 20 tablet 0   No  facility-administered medications prior to visit.    No Known  Allergies  ROS     Objective:    Physical Exam  BP 134/66 (BP Location: Left Arm, Patient Position: Sitting, Cuff Size: Large)   Pulse 60   Temp (!) 97.5 F (36.4 C) (Temporal)   Ht '6\' 2"'$  (1.88 m)   Wt 207 lb (93.9 kg)   SpO2 100%   BMI 26.58 kg/m  Wt Readings from Last 3 Encounters:  04/23/22 207 lb (93.9 kg)  04/21/22 209 lb (94.8 kg)  12/24/21 199 lb 9.6 oz (90.5 kg)       Assessment & Plan:   Problem List Items Addressed This Visit   None Visit Diagnoses     Cellulitis of right upper extremity    -  Primary       I am having [name] maintain his aspirin, Multiple Vitamins-Minerals (CENTRUM SILVER PO), Fish Oil, cholecalciferol, nitroGLYCERIN, acetaminophen, azelastine, halobetasol, diclofenac Sodium, potassium chloride, losartan, metoprolol succinate, simvastatin, niacin, levothyroxine, pantoprazole, clopidogrel, and sulfamethoxazole-trimethoprim.  No orders of the defined types were placed in this encounter.

## 2022-05-14 ENCOUNTER — Telehealth (INDEPENDENT_AMBULATORY_CARE_PROVIDER_SITE_OTHER): Payer: Medicare Other | Admitting: Family Medicine

## 2022-05-14 DIAGNOSIS — M25529 Pain in unspecified elbow: Secondary | ICD-10-CM | POA: Diagnosis not present

## 2022-05-14 DIAGNOSIS — L03113 Cellulitis of right upper limb: Secondary | ICD-10-CM

## 2022-05-14 DIAGNOSIS — M25429 Effusion, unspecified elbow: Secondary | ICD-10-CM | POA: Diagnosis not present

## 2022-05-14 MED ORDER — SULFAMETHOXAZOLE-TRIMETHOPRIM 800-160 MG PO TABS: 1.0000 | ORAL_TABLET | Freq: Two times a day (BID) | ORAL | 0 refills | Status: DC

## 2022-05-14 NOTE — Progress Notes (Signed)
MyChart Video Visit    Virtual Visit via Video Note   This visit type was conducted due to national recommendations for restrictions regarding the COVID-19 Pandemic (e.g. social distancing) in an effort to limit this patient's exposure and mitigate transmission in our community. This patient is at least at moderate risk for complications without adequate follow up. This format is felt to be most appropriate for this patient at this time. Physical exam was limited by quality of the video and audio technology used for the visit. CMA was able to get the patient set up on a video visit.  Patient location: Home. Patient and provider in visit Provider location: Office  I discussed the limitations of evaluation and management by telemedicine and the availability of in person appointments. The patient expressed understanding and agreed to proceed.  Visit Date: 05/14/2022  Today's healthcare provider: Harland Dingwall, NP-C     Subjective:    Patient ID: [name] [name], male    DOB: Apr 24, 1941, 82 y.o.   MRN: [id number]  Chief Complaint  Patient presents with   Cellulitis    F/u getting red again and swelling, slight pain    HPI  C/o mild pain, redness and swelling of right elbow. He had the same symptoms in late December. No trauma. He does report a hx of psoriasis.  Labs and XR done at visit in December.  He took a course of Bactrim and symptoms resolved until this 2-3 days ago.    States he feels fine otherwise. No fever, chills, N/V, other arthralgias or myalgias.     Past Medical History:  Diagnosis Date   Allergic rhinitis    Allergy    Anemia    Arthritis    in neck   CAD (coronary artery disease)    Cataract    Had surg 08/2016   CERUMEN IMPACTION 06/04/2009   5/15     Clotting disorder ([place])    Colon polyp    Diverticulosis    Family hx of colon cancer    brother   Hyperlipemia    Hypertension    Hypothyroidism    MI (myocardial infarction) ([place])  1993/2007   no stents   OSA (obstructive sleep apnea)    Psoriasis    Sleep apnea    no cpap- had sleep study per pt no OSA    Past Surgical History:  Procedure Laterality Date   CARDIAC CATHETERIZATION  1993   COLONOSCOPY     POLYPECTOMY     PTCA     TONSILLECTOMY      Family History  Problem Relation Age of Onset   Coronary artery disease Mother    Heart disease Mother    COPD Father    Colon polyps Sister        x2   Colon cancer Brother        died 45   Cancer Brother    Bone cancer Brother    Colon cancer Maternal Aunt    Colon cancer Maternal Aunt    Colon cancer Maternal Uncle    Colon cancer Paternal Aunt    Colon cancer Paternal Grandfather    Hypertension Other    Colon cancer Other    Esophageal cancer Neg Hx    Rectal cancer Neg Hx    Stomach cancer Neg Hx    Pancreatic cancer Neg Hx     Social History   Socioeconomic History   Marital status: Married    Spouse name:  Not on file   Number of children: 2   Years of education: Not on file   Highest education level: Not on file  Occupational History   Occupation: retired    Fish farm manager: SELF-EMPLOYED Engineer, 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   Tobacco Use   Smoking status: Former    Types: Cigarettes    Quit date: 04/1978    Years since quitting: 44.0   Smokeless tobacco: Never  Vaping Use   Vaping Use: Never used  Substance and Sexual Activity   Alcohol use: Yes    Alcohol/week: 6.0 standard drinks of alcohol    Types: 6 Cans of beer per week   Drug use: No   Sexual activity: Not Currently  Other Topics Concern   Not on file  Social History Narrative   Not on file   Social Determinants of Health   Financial Resource Strain: Low Risk  (10/02/2021)   Overall Financial Resource Strain (CARDIA)    Difficulty of Paying Living Expenses: Not hard at all  Food Insecurity: No Food Insecurity (09/11/2020)   Hunger Vital Sign    Worried About Running Out of Food in the Last Year: Never true    [place] in the Last Year: Never true  Transportation Needs: No Transportation Needs (10/02/2021)   PRAPARE - Hydrologist (Medical): No    Lack of Transportation (Non-Medical): No  Physical Activity: Sufficiently Active (10/02/2021)   Exercise Vital Sign    Days of Exercise per Week: 5 days    Minutes of Exercise per Session: 30 min  Stress: No Stress Concern Present (10/02/2021)   [place]    Feeling of Stress : Not at all  Social Connections: [place] (10/02/2021)   Social Connection and Isolation Panel [NHANES]    Frequency of Communication with Friends and Family: More than three times a week    Frequency of Social Gatherings with Friends and Family: Once a week    Attends Religious Services: More than 4 times per year    Active Member of [employer] or Organizations: Yes    Attends Music therapist: More than 4 times per year    Marital Status: Married  Human resources officer Violence: Not At Risk (10/02/2021)   Humiliation, Afraid, Rape, and Kick questionnaire    Fear of Current or Ex-Partner: No    Emotionally Abused: No    Physically Abused: No    Sexually Abused: No    Outpatient Medications Prior to Visit  Medication Sig Dispense Refill   acetaminophen (TYLENOL) 500 MG tablet Take 500 mg by mouth every 6 (six) hours as needed.     aspirin 81 MG tablet Take 81 mg by mouth daily.     azelastine (OPTIVAR) 0.05 % ophthalmic solution Place 1 drop into both eyes 2 (two) times daily. 6 mL 6   cholecalciferol (VITAMIN D) 1000 UNITS tablet Take 4,000 Units by mouth daily.      clopidogrel (PLAVIX) 75 MG tablet TAKE 1 TABLET BY MOUTH EVERY DAY 90 tablet 1   diclofenac Sodium (VOLTAREN) 1 % GEL Apply 1 application topically 4 (four) times daily. 100 g 3   halobetasol (ULTRAVATE) 0.05 % cream Apply topically 2 (two) times daily. 50 g 2   levothyroxine (SYNTHROID) 100 MCG tablet  TAKE 1 TABLET BY MOUTH EVERY DAY 90 tablet 2   losartan (COZAAR) 100 MG tablet Take 1  tablet (100 mg total) by mouth daily. 90 tablet 3   metoprolol succinate (TOPROL-XL) 25 MG 24 hr tablet Take 1 tablet (25 mg total) by mouth daily. 90 tablet 3   Multiple Vitamins-Minerals (CENTRUM SILVER PO) Take 1 tablet by mouth daily.     niacin 500 MG CR capsule Take 2 capsules (1,000 mg total) by mouth at bedtime. 100 capsule 3   nitroGLYCERIN (NITROSTAT) 0.4 MG SL tablet Place 1 tablet (0.4 mg total) under the tongue every 5 (five) minutes as needed for chest pain (3 doses MAX). 25 tablet 4   Omega-3 Fatty Acids (FISH OIL) 1200 MG CAPS Take 1 capsule by mouth daily.     pantoprazole (PROTONIX) 40 MG tablet TAKE 1 TABLET (40 MG TOTAL) BY MOUTH IN THE MORNING 90 tablet 1   potassium chloride (KLOR-CON) 10 MEQ tablet TAKE 1 TABLET BY MOUTH EVERY DAY 90 tablet 3   simvastatin (ZOCOR) 40 MG tablet TAKE 1 TABLET BY MOUTH DAILY 90 tablet 3   sulfamethoxazole-trimethoprim (BACTRIM DS) 800-160 MG tablet Take 1 tablet by mouth 2 (two) times daily. (Patient not taking: Reported on 05/14/2022) 20 tablet 0   No facility-administered medications prior to visit.    No Known Allergies  ROS     Objective:    Physical Exam  There were no vitals taken for this visit. Wt Readings from Last 3 Encounters:  04/23/22 207 lb (93.9 kg)  04/21/22 209 lb (94.8 kg)  12/24/21 199 lb 9.6 oz (90.5 kg)   Alert and oriented in no distress.  Respirations unlabored.  Right elbow with erythema and edema.  Pain 1 out of 10.  Nontender when he palpates the elbow joint.  Able to move right upper extremity without difficulty.    Assessment & Plan:   Problem List Items Addressed This Visit   None Visit Diagnoses     Cellulitis of right elbow    -  Primary   Relevant Medications   sulfamethoxazole-trimethoprim (BACTRIM DS) 800-160 MG tablet   Nontraumatic pain and swelling of elbow          Reviewed notes from when I saw  patient in the office for similar symptoms 04/21/2022.  X-ray showed mild arthritis and soft tissue swelling, otherwise negative.  Symptoms resolved with Bactrim.  No significant pain today.  I will prescribe Bactrim in case this is worsening over the weekend and he will follow-up with his PCP on Monday.  I have discontinued Alton T. Corl's sulfamethoxazole-trimethoprim. I am also having him start on sulfamethoxazole-trimethoprim. Additionally, I am having him maintain his aspirin, Multiple Vitamins-Minerals (CENTRUM SILVER PO), Fish Oil, cholecalciferol, nitroGLYCERIN, acetaminophen, azelastine, halobetasol, diclofenac Sodium, potassium chloride, losartan, metoprolol succinate, simvastatin, niacin, levothyroxine, pantoprazole, and clopidogrel.  Meds ordered this encounter  Medications   sulfamethoxazole-trimethoprim (BACTRIM DS) 800-160 MG tablet    Sig: Take 1 tablet by mouth 2 (two) times daily.    Dispense:  20 tablet    Refill:  0    Order Specific Question:   Supervising Provider    Answer:   Pricilla Holm A [[id number]]    I discussed the assessment and treatment plan with the patient. The patient was provided an opportunity to ask questions and all were answered. The patient agreed with the plan and demonstrated an understanding of the instructions.   The patient was advised to call back or seek an in-person evaluation if the symptoms worsen or if the condition fails to improve as anticipated.  I provided 15  minutes of face-to-face time during this encounter.   Harland Dingwall, NP-C [facility] [place] [phone] (phone) [phone] (fax)  [place]

## 2022-05-17 ENCOUNTER — Ambulatory Visit (INDEPENDENT_AMBULATORY_CARE_PROVIDER_SITE_OTHER): Payer: Medicare Other | Admitting: Internal Medicine

## 2022-05-17 ENCOUNTER — Encounter: Payer: Self-pay | Admitting: Internal Medicine

## 2022-05-17 ENCOUNTER — Other Ambulatory Visit (HOSPITAL_COMMUNITY)
Admission: RE | Admit: 2022-05-17 | Discharge: 2022-05-17 | Disposition: A | Discharge status: Home / Self Care | Payer: Medicare Other | Attending: Internal Medicine | Admitting: Internal Medicine

## 2022-05-17 VITALS — BP 120/70 | HR 65 | Temp 98.0°F | Resp 98 | Ht 74.0 in | Wt 210.0 lb

## 2022-05-17 DIAGNOSIS — M7031 Other bursitis of elbow, right elbow: Secondary | ICD-10-CM

## 2022-05-17 DIAGNOSIS — L03113 Cellulitis of right upper limb: Secondary | ICD-10-CM

## 2022-05-17 DIAGNOSIS — L03119 Cellulitis of unspecified part of limb: Secondary | ICD-10-CM | POA: Insufficient documentation

## 2022-05-17 DIAGNOSIS — M7030 Other bursitis of elbow, unspecified elbow: Secondary | ICD-10-CM | POA: Insufficient documentation

## 2022-05-17 DIAGNOSIS — D509 Iron deficiency anemia, unspecified: Secondary | ICD-10-CM

## 2022-05-17 LAB — CBC WITH DIFFERENTIAL/PLATELET
Basophils Absolute: 0 10*3/uL (ref 0.0–0.1)
Basophils Relative: 0.4 % (ref 0.0–3.0)
Eosinophils Absolute: 0.3 10*3/uL (ref 0.0–0.7)
Eosinophils Relative: 3.5 % (ref 0.0–5.0)
HCT: 38.3 % — ABNORMAL LOW (ref 39.0–52.0)
Hemoglobin: 12.9 g/dL — ABNORMAL LOW (ref 13.0–17.0)
Lymphocytes Relative: 16.2 % (ref 12.0–46.0)
Lymphs Abs: 1.3 10*3/uL (ref 0.7–4.0)
MCHC: 33.6 g/dL (ref 30.0–36.0)
MCV: 92.4 fl (ref 78.0–100.0)
Monocytes Absolute: 1.1 10*3/uL — ABNORMAL HIGH (ref 0.1–1.0)
Monocytes Relative: 14.1 % — ABNORMAL HIGH (ref 3.0–12.0)
Neutro Abs: 5.2 10*3/uL (ref 1.4–7.7)
Neutrophils Relative %: 65.8 % (ref 43.0–77.0)
Platelets: 229 10*3/uL (ref 150.0–400.0)
RBC: 4.14 Mil/uL — ABNORMAL LOW (ref 4.22–5.81)
RDW: 13.4 % (ref 11.5–15.5)
WBC: 7.9 10*3/uL (ref 4.0–10.5)

## 2022-05-17 LAB — COMPREHENSIVE METABOLIC PANEL
ALT: 23 U/L (ref 0–53)
AST: 23 U/L (ref 0–37)
Albumin: 4.3 g/dL (ref 3.5–5.2)
Alkaline Phosphatase: 41 U/L (ref 39–117)
BUN: 15 mg/dL (ref 6–23)
CO2: 27 mEq/L (ref 19–32)
Calcium: 9.6 mg/dL (ref 8.4–10.5)
Chloride: 99 mEq/L (ref 96–112)
Creatinine, Ser: 1.26 mg/dL (ref 0.40–1.50)
GFR: 53.64 mL/min — ABNORMAL LOW (ref >60.00)
Glucose, Bld: 93 mg/dL (ref 70–99)
Potassium: 4.7 mEq/L (ref 3.5–5.1)
Sodium: 134 mEq/L — ABNORMAL LOW (ref 135–145)
Total Bilirubin: 0.6 mg/dL (ref 0.2–1.2)
Total Protein: 7.1 g/dL (ref 6.0–8.3)

## 2022-05-17 MED ORDER — LIDOCAINE-EPINEPHRINE (PF) 2 %-1:200000 IJ SOLN
1.0000 mL | Freq: Once | INTRAMUSCULAR | Status: AC
  Administered 2022-05-17: 1 mL

## 2022-05-17 MED ORDER — MELOXICAM 15 MG PO TABS: 15.0000 mg | ORAL_TABLET | Freq: Every day | ORAL | 0 refills | Status: AC

## 2022-05-17 NOTE — Assessment & Plan Note (Signed)
Repeat CBC, iron On PO iron

## 2022-05-17 NOTE — Patient Instructions (Signed)
Epsom salt soakes

## 2022-05-17 NOTE — Addendum Note (Signed)
Addended by: Marijean Heath R on: 05/17/2022 04:57 PM   Modules accepted: Orders

## 2022-05-17 NOTE — Progress Notes (Signed)
Subjective:  Patient ID: [name] [name], male    DOB: March 21, 1941  Age: 82 y.o. MRN: [id number]  CC: No chief complaint on file.   HPI BRELAN HANNEN presents for R elbow cellulitis x 20 days Taking the 2nd course of Bactrim DS F/u on anemia  Outpatient Medications Prior to Visit  Medication Sig Dispense Refill   acetaminophen (TYLENOL) 500 MG tablet Take 500 mg by mouth every 6 (six) hours as needed.     aspirin 81 MG tablet Take 81 mg by mouth daily.     azelastine (OPTIVAR) 0.05 % ophthalmic solution Place 1 drop into both eyes 2 (two) times daily. 6 mL 6   cholecalciferol (VITAMIN D) 1000 UNITS tablet Take 4,000 Units by mouth daily.      clopidogrel (PLAVIX) 75 MG tablet TAKE 1 TABLET BY MOUTH EVERY DAY 90 tablet 1   diclofenac Sodium (VOLTAREN) 1 % GEL Apply 1 application topically 4 (four) times daily. 100 g 3   halobetasol (ULTRAVATE) 0.05 % cream Apply topically 2 (two) times daily. 50 g 2   levothyroxine (SYNTHROID) 100 MCG tablet TAKE 1 TABLET BY MOUTH EVERY DAY 90 tablet 2   losartan (COZAAR) 100 MG tablet Take 1 tablet (100 mg total) by mouth daily. 90 tablet 3   metoprolol succinate (TOPROL-XL) 25 MG 24 hr tablet Take 1 tablet (25 mg total) by mouth daily. 90 tablet 3   Multiple Vitamins-Minerals (CENTRUM SILVER PO) Take 1 tablet by mouth daily.     niacin 500 MG CR capsule Take 2 capsules (1,000 mg total) by mouth at bedtime. 100 capsule 3   nitroGLYCERIN (NITROSTAT) 0.4 MG SL tablet Place 1 tablet (0.4 mg total) under the tongue every 5 (five) minutes as needed for chest pain (3 doses MAX). 25 tablet 4   Omega-3 Fatty Acids (FISH OIL) 1200 MG CAPS Take 1 capsule by mouth daily.     pantoprazole (PROTONIX) 40 MG tablet TAKE 1 TABLET (40 MG TOTAL) BY MOUTH IN THE MORNING 90 tablet 1   potassium chloride (KLOR-CON) 10 MEQ tablet TAKE 1 TABLET BY MOUTH EVERY DAY 90 tablet 3   simvastatin (ZOCOR) 40 MG tablet TAKE 1 TABLET BY MOUTH DAILY 90 tablet 3    sulfamethoxazole-trimethoprim (BACTRIM DS) 800-160 MG tablet Take 1 tablet by mouth 2 (two) times daily. 20 tablet 0   No facility-administered medications prior to visit.    ROS: Review of Systems  Constitutional:  Negative for activity change, chills, diaphoresis, fatigue, fever and unexpected weight change.  Musculoskeletal:  Positive for arthralgias.  Skin:  Positive for color change.  Hematological:  Does not bruise/bleed easily.    Objective:  BP 120/70 (BP Location: Right Arm, Patient Position: Sitting, Cuff Size: Normal)   Pulse 65   Temp 98 F (36.7 C) (Oral)   Resp (!) 98   Ht '6\' 2"'$  (1.88 m)   Wt 210 lb (95.3 kg)   BMI 26.96 kg/m   BP Readings from Last 3 Encounters:  05/17/22 120/70  04/23/22 134/66  04/21/22 118/60    Wt Readings from Last 3 Encounters:  05/17/22 210 lb (95.3 kg)  04/23/22 207 lb (93.9 kg)  04/21/22 209 lb (94.8 kg)    Physical Exam Constitutional:      General: He is not in acute distress.    Appearance: He is well-developed.     Comments: NAD  Eyes:     Conjunctiva/sclera: Conjunctivae normal.     Pupils: Pupils are equal,  round, and reactive to light.  Neck:     Thyroid: No thyromegaly.     Vascular: No JVD.  Cardiovascular:     Rate and Rhythm: Normal rate and regular rhythm.     Heart sounds: Normal heart sounds. No murmur heard.    No friction rub. No gallop.  Pulmonary:     Effort: Pulmonary effort is normal. No respiratory distress.     Breath sounds: Normal breath sounds. No wheezing or rales.  Chest:     Chest wall: No tenderness.  Abdominal:     General: Bowel sounds are normal. There is no distension.     Palpations: Abdomen is soft. There is no mass.     Tenderness: There is no abdominal tenderness. There is no guarding or rebound.  Musculoskeletal:        General: Tenderness present. Normal range of motion.     Cervical back: Normal range of motion.  Lymphadenopathy:     Cervical: No cervical adenopathy.   Skin:    General: Skin is warm and dry.     Findings: Erythema present. No rash.  Neurological:     Mental Status: He is alert and oriented to person, place, and time.     Cranial Nerves: No cranial nerve deficit.     Motor: No abnormal muscle tone.     Coordination: Coordination normal.     Gait: Gait normal.     Deep Tendon Reflexes: Reflexes are normal and symmetric.  Psychiatric:        Behavior: Behavior normal.        Thought Content: Thought content normal.        Judgment: Judgment normal.      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   Risks including unsuccessful procedure , bleeding, infection, bruising, skin atrophy and others were explained to the patient in detail as well as the benefits. Informed consent was obtained verbally.  Tthe patient was placed in a comfortable position. Skin was prepped with Betadine and alcohol  and anesthetized with a cooling spray. 1 cc 2% Lido was used for skin anesthesia. 20 g needle on 10 cc syr was introduced. 1/2 cc of clear pink fluid was withdrawn. Band-Aid was applied.   Tolerated well. Complications: None. ACE wrap   Postprocedure instructions :    A Band-Aid should be left on for 12 hours. Injection therapy is not a cure itself. It is used in conjunction with other modalities. You can use nonsteroidal anti-inflammatories like ibuprofen , hot and cold compresses. Rest is recommended in the next 24 hours. You need to report immediately  if fever, chills or any signs of infection develop.     Lab Results  Component Value Date   WBC 9.2 04/21/2022   HGB 14.4 04/21/2022   HCT 42.5 04/21/2022   PLT 226.0 04/21/2022   GLUCOSE 94 07/02/2021   CHOL 129 07/02/2021   TRIG 88.0 07/02/2021   HDL 50.10 07/02/2021   LDLCALC 61 07/02/2021   ALT 21 07/02/2021   AST 26 07/02/2021   NA  136 07/02/2021   K 4.0 07/02/2021   CL 103 07/02/2021   CREATININE 1.07 07/02/2021   BUN 13 07/02/2021   CO2 24 07/02/2021   TSH 2.37 07/02/2021   PSA 0.52 07/02/2021   INR  1.0 ratio 03/06/2010   HGBA1C 5.8 08/28/2013    DG Chest 2 View  Result Date: 07/18/2021 CLINICAL DATA:  Cough, COVID EXAM: CHEST - 2 VIEW COMPARISON:  04/01/2020 FINDINGS: The heart size and mediastinal contours are within normal limits. Both lungs are clear. The visualized skeletal structures are unremarkable. IMPRESSION: No acute abnormality of the lungs. Electronically Signed   By: Delanna Ahmadi M.D.   On: 07/18/2021 16:14    Assessment & Plan:   Problem List Items Addressed This Visit       Musculoskeletal and Integument   Bursitis of elbow - Primary    Finish Bactrim Will drain/aspirate ACE wrap Epsom salt soakes Meloxicam x 2 weeks      Relevant Orders   Cell count + diff,  w/ cryst-synvl fld   Gram stain   Body fluid culture   CBC with Differential/Platelet   Iron, TIBC and Ferritin Panel   Comprehensive metabolic panel     Other   Anemia    Repeat CBC, iron On PO iron      Relevant Orders   CBC with Differential/Platelet   Iron, TIBC and Ferritin Panel   Comprehensive metabolic panel   Cellulitis of arm    Finish Bactrim Will drain/aspirate bursa ACE wrap Epsom salt soakes Meloxicam x 2 weeks         Meds ordered this encounter  Medications   meloxicam (MOBIC) 15 MG tablet    Sig: Take 1 tablet (15 mg total) by mouth daily.    Dispense:  15 tablet    Refill:  0      Follow-up: Return for a follow-up visit.  Walker Kehr, MD

## 2022-05-17 NOTE — Assessment & Plan Note (Signed)
Finish Bactrim Will drain/aspirate bursa ACE wrap Epsom salt soakes Meloxicam x 2 weeks

## 2022-05-17 NOTE — Assessment & Plan Note (Addendum)
Finish Bactrim Will drain/aspirate ACE wrap Epsom salt soakes Meloxicam x 2 weeks

## 2022-05-18 ENCOUNTER — Telehealth: Payer: Self-pay | Admitting: Internal Medicine

## 2022-05-18 LAB — GRAM STAIN: Gram Stain: NONE SEEN

## 2022-05-18 LAB — IRON,TIBC AND FERRITIN PANEL
%SAT: 9 % (calc) — ABNORMAL LOW (ref 20–48)
Ferritin: 109 ng/mL (ref 24–380)
Iron: 29 ug/dL — ABNORMAL LOW (ref 50–180)
TIBC: 341 mcg/dL (calc) (ref 250–425)

## 2022-05-18 LAB — SYNOVIAL FLUID, CRYSTAL: Crystals, Fluid: NEGATIVE

## 2022-05-18 NOTE — Telephone Encounter (Signed)
[facility] called and said that they did not get enough sample to do a count on the patient, but that they will go ahead and do a crystal count checking for gout.    Just for you fyi

## 2022-05-18 NOTE — Telephone Encounter (Signed)
[facility] called and said that they did not get enough sample to do a count on the patient, but that they will go ahead and do a crystal count checking for gout.   **She also stated she needs you to release the culture in the lab order

## 2022-05-19 NOTE — Telephone Encounter (Signed)
Noted! Thank you

## 2022-05-21 ENCOUNTER — Other Ambulatory Visit: Payer: Self-pay | Admitting: Internal Medicine

## 2022-05-21 DIAGNOSIS — R944 Abnormal results of kidney function studies: Secondary | ICD-10-CM

## 2022-05-22 LAB — BODY FLUID CULTURE W GRAM STAIN
Culture: NO GROWTH
Gram Stain: NONE SEEN

## 2022-06-07 ENCOUNTER — Ambulatory Visit
Admission: RE | Admit: 2022-06-07 | Discharge: 2022-06-07 | Disposition: A | Discharge status: Home / Self Care | Payer: Medicare Other | Source: Ambulatory Visit | Attending: Internal Medicine | Admitting: Internal Medicine

## 2022-06-07 DIAGNOSIS — R944 Abnormal results of kidney function studies: Secondary | ICD-10-CM | POA: Diagnosis not present

## 2022-06-14 DIAGNOSIS — H353111 Nonexudative age-related macular degeneration, right eye, early dry stage: Secondary | ICD-10-CM | POA: Diagnosis not present

## 2022-06-14 DIAGNOSIS — H43811 Vitreous degeneration, right eye: Secondary | ICD-10-CM | POA: Diagnosis not present

## 2022-06-14 DIAGNOSIS — H02831 Dermatochalasis of right upper eyelid: Secondary | ICD-10-CM | POA: Diagnosis not present

## 2022-06-14 DIAGNOSIS — H26493 Other secondary cataract, bilateral: Secondary | ICD-10-CM | POA: Diagnosis not present

## 2022-06-14 DIAGNOSIS — H02834 Dermatochalasis of left upper eyelid: Secondary | ICD-10-CM | POA: Diagnosis not present

## 2022-06-14 DIAGNOSIS — Z961 Presence of intraocular lens: Secondary | ICD-10-CM | POA: Diagnosis not present

## 2022-06-20 ENCOUNTER — Other Ambulatory Visit: Payer: Self-pay | Admitting: Internal Medicine

## 2022-07-09 ENCOUNTER — Other Ambulatory Visit: Payer: Self-pay | Admitting: Internal Medicine

## 2022-07-22 ENCOUNTER — Other Ambulatory Visit: Payer: Self-pay | Admitting: Internal Medicine

## 2022-08-02 DIAGNOSIS — H43811 Vitreous degeneration, right eye: Secondary | ICD-10-CM | POA: Diagnosis not present

## 2022-08-09 ENCOUNTER — Other Ambulatory Visit: Payer: Self-pay | Admitting: Internal Medicine

## 2022-09-02 ENCOUNTER — Ambulatory Visit (INDEPENDENT_AMBULATORY_CARE_PROVIDER_SITE_OTHER): Payer: Medicare Other | Admitting: Internal Medicine

## 2022-09-02 ENCOUNTER — Encounter: Payer: Self-pay | Admitting: Internal Medicine

## 2022-09-02 VITALS — BP 128/68 | HR 65 | Temp 97.8°F | Ht 74.0 in | Wt 205.0 lb

## 2022-09-02 DIAGNOSIS — I2510 Atherosclerotic heart disease of native coronary artery without angina pectoris: Secondary | ICD-10-CM

## 2022-09-02 DIAGNOSIS — I10 Essential (primary) hypertension: Secondary | ICD-10-CM | POA: Diagnosis not present

## 2022-09-02 DIAGNOSIS — M5440 Lumbago with sciatica, unspecified side: Secondary | ICD-10-CM | POA: Diagnosis not present

## 2022-09-02 DIAGNOSIS — D500 Iron deficiency anemia secondary to blood loss (chronic): Secondary | ICD-10-CM | POA: Diagnosis not present

## 2022-09-02 DIAGNOSIS — E785 Hyperlipidemia, unspecified: Secondary | ICD-10-CM | POA: Diagnosis not present

## 2022-09-02 DIAGNOSIS — Z23 Encounter for immunization: Secondary | ICD-10-CM | POA: Diagnosis not present

## 2022-09-02 DIAGNOSIS — E034 Atrophy of thyroid (acquired): Secondary | ICD-10-CM

## 2022-09-02 DIAGNOSIS — N320 Bladder-neck obstruction: Secondary | ICD-10-CM | POA: Diagnosis not present

## 2022-09-02 LAB — COMPREHENSIVE METABOLIC PANEL
ALT: 31 U/L (ref 0–53)
AST: 29 U/L (ref 0–37)
Albumin: 4.4 g/dL (ref 3.5–5.2)
Alkaline Phosphatase: 44 U/L (ref 39–117)
BUN: 11 mg/dL (ref 6–23)
CO2: 28 mEq/L (ref 19–32)
Calcium: 9.9 mg/dL (ref 8.4–10.5)
Chloride: 99 mEq/L (ref 96–112)
Creatinine, Ser: 1.05 mg/dL (ref 0.40–1.50)
GFR: 66.62 mL/min (ref >60.00)
Glucose, Bld: 96 mg/dL (ref 70–99)
Potassium: 4.5 mEq/L (ref 3.5–5.1)
Sodium: 138 mEq/L (ref 135–145)
Total Bilirubin: 1.1 mg/dL (ref 0.2–1.2)
Total Protein: 6.9 g/dL (ref 6.0–8.3)

## 2022-09-02 LAB — TSH: TSH: 6.96 u[IU]/mL — ABNORMAL HIGH (ref 0.35–5.50)

## 2022-09-02 LAB — URINALYSIS
Bilirubin Urine: NEGATIVE
Hgb urine dipstick: NEGATIVE
Ketones, ur: NEGATIVE
Leukocytes,Ua: NEGATIVE
Nitrite: NEGATIVE
Specific Gravity, Urine: 1.01 (ref 1.000–1.030)
Total Protein, Urine: NEGATIVE
Urine Glucose: NEGATIVE
Urobilinogen, UA: 0.2 (ref 0.0–1.0)
pH: 6.5 (ref 5.0–8.0)

## 2022-09-02 LAB — CBC WITH DIFFERENTIAL/PLATELET
Basophils Absolute: 0 10*3/uL (ref 0.0–0.1)
Basophils Relative: 0.6 % (ref 0.0–3.0)
Eosinophils Absolute: 0.2 10*3/uL (ref 0.0–0.7)
Eosinophils Relative: 3.1 % (ref 0.0–5.0)
HCT: 42.5 % (ref 39.0–52.0)
Hemoglobin: 14.4 g/dL (ref 13.0–17.0)
Lymphocytes Relative: 24.9 % (ref 12.0–46.0)
Lymphs Abs: 1.8 10*3/uL (ref 0.7–4.0)
MCHC: 33.9 g/dL (ref 30.0–36.0)
MCV: 91.1 fl (ref 78.0–100.0)
Monocytes Absolute: 0.7 10*3/uL (ref 0.1–1.0)
Monocytes Relative: 9.6 % (ref 3.0–12.0)
Neutro Abs: 4.6 10*3/uL (ref 1.4–7.7)
Neutrophils Relative %: 61.8 % (ref 43.0–77.0)
Platelets: 228 10*3/uL (ref 150.0–400.0)
RBC: 4.66 Mil/uL (ref 4.22–5.81)
RDW: 13.4 % (ref 11.5–15.5)
WBC: 7.4 10*3/uL (ref 4.0–10.5)

## 2022-09-02 LAB — LIPID PANEL
Cholesterol: 135 mg/dL (ref 0–200)
HDL: 48.2 mg/dL (ref >39.00)
LDL Cholesterol: 71 mg/dL (ref 0–99)
NonHDL: 86.76
Total CHOL/HDL Ratio: 3
Triglycerides: 81 mg/dL (ref 0.0–149.0)
VLDL: 16.2 mg/dL (ref 0.0–40.0)

## 2022-09-02 LAB — PSA: PSA: 0.75 ng/mL (ref 0.10–4.00)

## 2022-09-02 NOTE — Assessment & Plan Note (Signed)
Continue on metoprolol, Losartan ?

## 2022-09-02 NOTE — Assessment & Plan Note (Signed)
On iron Check CBC, iron

## 2022-09-02 NOTE — Assessment & Plan Note (Signed)
Check TSH 

## 2022-09-02 NOTE — Assessment & Plan Note (Signed)
Meloxicam as needed 

## 2022-09-02 NOTE — Progress Notes (Signed)
Subjective:  Patient ID: [name] [name], male    DOB: Apr 01, 1941  Age: 82 y.o. MRN: [id number]  CC: No chief complaint on file.   HPI [name] [name] presents for HTN, hypothyroidism, CAD  Outpatient Medications Prior to Visit  Medication Sig Dispense Refill   acetaminophen (TYLENOL) 500 MG tablet Take 500 mg by mouth every 6 (six) hours as needed.     aspirin 81 MG tablet Take 81 mg by mouth daily.     azelastine (OPTIVAR) 0.05 % ophthalmic solution Place 1 drop into both eyes 2 (two) times daily. 6 mL 6   cholecalciferol (VITAMIN D) 1000 UNITS tablet Take 4,000 Units by mouth daily.      clopidogrel (PLAVIX) 75 MG tablet TAKE 1 TABLET BY MOUTH EVERY DAY 90 tablet 0   diclofenac Sodium (VOLTAREN) 1 % GEL Apply 1 application topically 4 (four) times daily. 100 g 3   halobetasol (ULTRAVATE) 0.05 % cream Apply topically 2 (two) times daily. 50 g 2   losartan (COZAAR) 100 MG tablet Take 1 tablet (100 mg total) by mouth daily. Overdue for Annual appt must see provider for future refills 30 tablet 0   meloxicam (MOBIC) 15 MG tablet Take 1 tablet (15 mg total) by mouth daily. 15 tablet 0   metoprolol succinate (TOPROL-XL) 25 MG 24 hr tablet Take 1 tablet (25 mg total) by mouth daily. Annual appt is due w/labs must see provider for future refills 30 tablet 0   Multiple Vitamins-Minerals (CENTRUM SILVER PO) Take 1 tablet by mouth daily.     niacin 500 MG CR capsule Take 2 capsules (1,000 mg total) by mouth at bedtime. 100 capsule 3   nitroGLYCERIN (NITROSTAT) 0.4 MG SL tablet Place 1 tablet (0.4 mg total) under the tongue every 5 (five) minutes as needed for chest pain (3 doses MAX). 25 tablet 4   Omega-3 Fatty Acids (FISH OIL) 1200 MG CAPS Take 1 capsule by mouth daily.     pantoprazole (PROTONIX) 40 MG tablet Take 1 tablet (40 mg total) by mouth in the morning. Annual appt due in March  must see provider for future refills 90 tablet 0   potassium chloride (KLOR-CON) 10 MEQ tablet Take 1  tablet (10 mEq total) by mouth daily. Overdue for Annual appt must see provider for future refills 30 tablet 0   simvastatin (ZOCOR) 40 MG tablet TAKE 1 TABLET BY MOUTH DAILY 90 tablet 3   sulfamethoxazole-trimethoprim (BACTRIM DS) 800-160 MG tablet Take 1 tablet by mouth 2 (two) times daily. 20 tablet 0   levothyroxine (SYNTHROID) 100 MCG tablet TAKE 1 TABLET BY MOUTH EVERY DAY 90 tablet 2   No facility-administered medications prior to visit.    ROS: Review of Systems  Constitutional:  Negative for appetite change, fatigue and unexpected weight change.  HENT:  Negative for congestion, nosebleeds, sneezing, sore throat and trouble swallowing.   Eyes:  Negative for itching and visual disturbance.  Respiratory:  Negative for cough.   Cardiovascular:  Negative for chest pain, palpitations and leg swelling.  Gastrointestinal:  Negative for abdominal distention, blood in stool, diarrhea and nausea.  Genitourinary:  Negative for frequency and hematuria.  Musculoskeletal:  Negative for back pain, gait problem, joint swelling and neck pain.  Skin:  Negative for rash.  Neurological:  Negative for dizziness, tremors, speech difficulty and weakness.  Psychiatric/Behavioral:  Negative for agitation, dysphoric mood and sleep disturbance. The patient is not nervous/anxious.     Objective:  BP 128/68 (  BP Location: Left Arm, Patient Position: Sitting, Cuff Size: Normal)   Pulse 65   Temp 97.8 F (36.6 C) (Oral)   Ht 6\' 2"  (1.88 m)   Wt 205 lb (93 kg)   SpO2 97%   BMI 26.32 kg/m   BP Readings from Last 3 Encounters:  09/02/22 128/68  05/17/22 120/70  04/23/22 134/66    Wt Readings from Last 3 Encounters:  09/02/22 205 lb (93 kg)  05/17/22 210 lb (95.3 kg)  04/23/22 207 lb (93.9 kg)    Physical Exam Constitutional:      General: He is not in acute distress.    Appearance: He is well-developed.     Comments: NAD  Eyes:     Conjunctiva/sclera: Conjunctivae normal.     Pupils: Pupils  are equal, round, and reactive to light.  Neck:     Thyroid: No thyromegaly.     Vascular: No JVD.  Cardiovascular:     Rate and Rhythm: Normal rate and regular rhythm.     Heart sounds: Normal heart sounds. No murmur heard.    No friction rub. No gallop.  Pulmonary:     Effort: Pulmonary effort is normal. No respiratory distress.     Breath sounds: Normal breath sounds. No wheezing or rales.  Chest:     Chest wall: No tenderness.  Abdominal:     General: Bowel sounds are normal. There is no distension.     Palpations: Abdomen is soft. There is no mass.     Tenderness: There is no abdominal tenderness. There is no guarding or rebound.  Musculoskeletal:        General: No tenderness. Normal range of motion.     Cervical back: Normal range of motion.  Lymphadenopathy:     Cervical: No cervical adenopathy.  Skin:    General: Skin is warm and dry.     Findings: No rash.  Neurological:     Mental Status: He is alert and oriented to person, place, and time.     Cranial Nerves: No cranial nerve deficit.     Motor: No abnormal muscle tone.     Coordination: Coordination normal.     Gait: Gait normal.     Deep Tendon Reflexes: Reflexes are normal and symmetric.  Psychiatric:        Behavior: Behavior normal.        Thought Content: Thought content normal.        Judgment: Judgment normal.     Lab Results  Component Value Date   WBC 7.4 09/02/2022   HGB 14.4 09/02/2022   HCT 42.5 09/02/2022   PLT 228.0 09/02/2022   GLUCOSE 96 09/02/2022   CHOL 135 09/02/2022   TRIG 81.0 09/02/2022   HDL 48.20 09/02/2022   LDLCALC 71 09/02/2022   ALT 31 09/02/2022   AST 29 09/02/2022   NA 138 09/02/2022   K 4.5 09/02/2022   CL 99 09/02/2022   CREATININE 1.05 09/02/2022   BUN 11 09/02/2022   CO2 28 09/02/2022   TSH 6.96 (H) 09/02/2022   PSA 0.75 09/02/2022   INR 1.0 ratio 03/06/2010   HGBA1C 5.8 08/28/2013    US RENAL  Result Date: 06/07/2022 CLINICAL DATA:  Decreased GFR. EXAM:  RENAL / URINARY TRACT ULTRASOUND COMPLETE COMPARISON:  None Available. FINDINGS: Right Kidney: Renal measurements: 12.7 x 5.5 x 4.9 centimeters = volume: 179 mL. Echogenicity and renal parenchymal thickness are normal. No hydronephrosis. LOWER pole cyst is 3.8 x 2.8 x 3.2  centimeters. No specific imaging follow-up is needed for this finding. Left Kidney: Renal measurements: 12.8 x 5.4 x 5.6 centimeters = volume: 201 mL. Echogenicity within normal limits. No mass or hydronephrosis visualized. Bladder: Appears normal for degree of bladder distention. Bilateral ureteral jets are present. Other: None. IMPRESSION: No hydronephrosis or suspicious renal mass. Electronically Signed   By: [name] [name] M.D.   On: 06/07/2022 14:52    Assessment & Plan:   Problem List Items Addressed This Visit     Hypothyroidism    Check TSH      Relevant Orders   TSH (Completed)   Urinalysis (Completed)   CBC with Differential/Platelet (Completed)   Lipid panel (Completed)   PSA (Completed)   Comprehensive metabolic panel (Completed)   Iron, TIBC and Ferritin Panel (Completed)   Dyslipidemia - Primary    On Simvastatin Monitor lipids        Relevant Orders   Lipid panel (Completed)   HYPERTENSION, BENIGN    Continue on metoprolol, Losartan      Relevant Orders   TSH (Completed)   Urinalysis (Completed)   CBC with Differential/Platelet (Completed)   Lipid panel (Completed)   PSA (Completed)   Comprehensive metabolic panel (Completed)   Iron, TIBC and Ferritin Panel (Completed)   Coronary atherosclerosis    Follow-up with Dr Eden Emms Chronic, no angina Continue on metoprolol, Losartan, ASA, Simvastatin      Low back pain    Meloxicam as needed      IDA (iron deficiency anemia)    On iron Check CBC, iron      Relevant Orders   TSH (Completed)   Urinalysis (Completed)   CBC with Differential/Platelet (Completed)   Lipid panel (Completed)   PSA (Completed)   Comprehensive metabolic  panel (Completed)   Iron, TIBC and Ferritin Panel (Completed)   Other Visit Diagnoses     Bladder neck obstruction       Relevant Orders   PSA (Completed)   Need for vaccination       Relevant Orders   Pneumococcal conjugate vaccine 20-valent (Prevnar 20) (Completed)         No orders of the defined types were placed in this encounter.     Follow-up: Return in about 6 months (around 03/05/2023) for Wellness Exam.  Sonda Primes, MD

## 2022-09-02 NOTE — Assessment & Plan Note (Addendum)
On Simvastatin Monitor lipids

## 2022-09-03 LAB — IRON,TIBC AND FERRITIN PANEL
%SAT: 35 % (calc) (ref 20–48)
Ferritin: 74 ng/mL (ref 24–380)
Iron: 131 ug/dL (ref 50–180)
TIBC: 375 mcg/dL (calc) (ref 250–425)

## 2022-09-04 ENCOUNTER — Other Ambulatory Visit: Payer: Self-pay | Admitting: Internal Medicine

## 2022-09-04 MED ORDER — LEVOTHYROXINE SODIUM 112 MCG PO TABS: 112.0000 ug | ORAL_TABLET | Freq: Every day | ORAL | 3 refills | Status: DC

## 2022-09-06 NOTE — Assessment & Plan Note (Signed)
Follow-up with Dr Eden Emms Chronic, no angina Continue on metoprolol, Losartan, ASA, Simvastatin

## 2022-09-07 ENCOUNTER — Other Ambulatory Visit: Payer: Self-pay | Admitting: Internal Medicine

## 2022-09-17 ENCOUNTER — Other Ambulatory Visit: Payer: Self-pay | Admitting: Internal Medicine

## 2022-10-04 DIAGNOSIS — H353111 Nonexudative age-related macular degeneration, right eye, early dry stage: Secondary | ICD-10-CM | POA: Diagnosis not present

## 2022-10-04 DIAGNOSIS — Z961 Presence of intraocular lens: Secondary | ICD-10-CM | POA: Diagnosis not present

## 2022-10-04 DIAGNOSIS — H26493 Other secondary cataract, bilateral: Secondary | ICD-10-CM | POA: Diagnosis not present

## 2022-10-04 DIAGNOSIS — H43811 Vitreous degeneration, right eye: Secondary | ICD-10-CM | POA: Diagnosis not present

## 2022-10-04 DIAGNOSIS — H02834 Dermatochalasis of left upper eyelid: Secondary | ICD-10-CM | POA: Diagnosis not present

## 2022-10-04 DIAGNOSIS — H02831 Dermatochalasis of right upper eyelid: Secondary | ICD-10-CM | POA: Diagnosis not present

## 2022-10-06 ENCOUNTER — Ambulatory Visit (INDEPENDENT_AMBULATORY_CARE_PROVIDER_SITE_OTHER): Payer: Medicare Other

## 2022-10-06 VITALS — Ht 74.0 in | Wt 200.0 lb

## 2022-10-06 DIAGNOSIS — Z0000 Encounter for general adult medical examination without abnormal findings: Secondary | ICD-10-CM

## 2022-10-06 NOTE — Progress Notes (Cosign Needed Addendum)
I connected with  [name] [name] on 10/06/22 by a audio enabled telemedicine application and verified that I am speaking with the correct person using two identifiers.  Patient Location: Home  Provider Location: Office/Clinic  I discussed the limitations of evaluation and management by telemedicine. The patient expressed understanding and agreed to proceed.  Patient Medicare AWV questionnaire was completed by the patient on 10/05/2022; I have confirmed that all information answered by patient is correct and no changes since this date.     Subjective:   [name] [name] is a 82 y.o. male who presents for Medicare Annual/Subsequent preventive examination.  Review of Systems     Cardiac Risk Factors include: advanced age (>83men, >64 women);family history of premature cardiovascular disease;hypertension;male gender;dyslipidemia     Objective:    Today's Vitals   10/06/22 1003 10/06/22 1004  Weight: 200 lb (90.7 kg)   Height: 6\' 2"  (1.88 m)   PainSc: 3  3   PainLoc: Back    Body mass index is 25.68 kg/m.     10/06/2022   10:06 AM 10/02/2021   11:05 AM 09/11/2020   10:48 AM 05/20/2017    9:02 AM 11/11/2016    8:25 AM 02/19/2016    7:28 AM 10/08/2014   10:04 AM  Advanced Directives  Does Patient Have a Medical Advance Directive? No No No No No No No  Would patient like information on creating a medical advance directive? Yes (MAU/Ambulatory/Procedural Areas - Information given) No - Patient declined Yes (MAU/Ambulatory/Procedural Areas - Information given)  No - Patient declined  No - patient declined information    Current Medications (verified) Outpatient Encounter Medications as of 10/06/2022  Medication Sig   acetaminophen (TYLENOL) 500 MG tablet Take 500 mg by mouth every 6 (six) hours as needed.   aspirin 81 MG tablet Take 81 mg by mouth daily.   azelastine (OPTIVAR) 0.05 % ophthalmic solution Place 1 drop into both eyes 2 (two) times daily.   cholecalciferol (VITAMIN D)  1000 UNITS tablet Take 4,000 Units by mouth daily.    clopidogrel (PLAVIX) 75 MG tablet TAKE 1 TABLET BY MOUTH EVERY DAY   diclofenac Sodium (VOLTAREN) 1 % GEL Apply 1 application topically 4 (four) times daily.   halobetasol (ULTRAVATE) 0.05 % cream Apply topically 2 (two) times daily.   levothyroxine (SYNTHROID) 112 MCG tablet Take 1 tablet (112 mcg total) by mouth daily.   losartan (COZAAR) 100 MG tablet TAKE 1 TABLET BY MOUTH EVERY DAY   meloxicam (MOBIC) 15 MG tablet Take 1 tablet (15 mg total) by mouth daily.   metoprolol succinate (TOPROL-XL) 25 MG 24 hr tablet Take 1 tablet (25 mg total) by mouth daily. Annual appt is due w/labs must see provider for future refills   Multiple Vitamins-Minerals (CENTRUM SILVER PO) Take 1 tablet by mouth daily.   niacin 500 MG CR capsule Take 2 capsules (1,000 mg total) by mouth at bedtime.   nitroGLYCERIN (NITROSTAT) 0.4 MG SL tablet Place 1 tablet (0.4 mg total) under the tongue every 5 (five) minutes as needed for chest pain (3 doses MAX).   Omega-3 Fatty Acids (FISH OIL) 1200 MG CAPS Take 1 capsule by mouth daily.   pantoprazole (PROTONIX) 40 MG tablet Take 1 tablet (40 mg total) by mouth every morning.   potassium chloride (KLOR-CON) 10 MEQ tablet TAKE 1 TABLET BY MOUTH EVERY DAY   simvastatin (ZOCOR) 40 MG tablet TAKE 1 TABLET BY MOUTH EVERY DAY   sulfamethoxazole-trimethoprim (BACTRIM DS) 800-160  MG tablet Take 1 tablet by mouth 2 (two) times daily.   No facility-administered encounter medications on file as of 10/06/2022.    Allergies (verified) Patient has no known allergies.   History: Past Medical History:  Diagnosis Date   Allergic rhinitis    Allergy    Anemia    Arthritis    in neck   CAD (coronary artery disease)    Cataract    Had surg 08/2016   CERUMEN IMPACTION 06/04/2009   5/15     Clotting disorder (HCC)    Colon polyp    Diverticulosis    Family hx of colon cancer    brother   Hyperlipemia    Hypertension     Hypothyroidism    MI (myocardial infarction) (HCC) 1993/2007   no stents   OSA (obstructive sleep apnea)    Psoriasis    Sleep apnea    no cpap- had sleep study per pt no OSA   Past Surgical History:  Procedure Laterality Date   CARDIAC CATHETERIZATION  1993   COLONOSCOPY     POLYPECTOMY     PTCA     TONSILLECTOMY     Family History  Problem Relation Age of Onset   Coronary artery disease Mother    Heart disease Mother    COPD Father    Colon polyps Sister        x2   Colon cancer Brother        died 3   Cancer Brother    Bone cancer Brother    Colon cancer Maternal Aunt    Colon cancer Maternal Aunt    Colon cancer Maternal Uncle    Colon cancer Paternal Aunt    Colon cancer Paternal Grandfather    Hypertension Other    Colon cancer Other    Esophageal cancer Neg Hx    Rectal cancer Neg Hx    Stomach cancer Neg Hx    Pancreatic cancer Neg Hx    Social History   Socioeconomic History   Marital status: Married    Spouse name: Not on file   Number of children: 2   Years of education: Not on file   Highest education level: Not on file  Occupational History   Occupation: retired    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   Tobacco Use   Smoking status: Former    Types: Cigarettes    Quit date: 04/1978    Years since quitting: 44.4   Smokeless tobacco: Never  Vaping Use   Vaping Use: Never used  Substance and Sexual Activity   Alcohol use: Yes    Alcohol/week: 6.0 standard drinks of alcohol    Types: 6 Cans of beer per week   Drug use: No   Sexual activity: Not Currently  Other Topics Concern   Not on file  Social History Narrative   Not on file   Social Determinants of Health   Financial Resource Strain: Low Risk  (10/06/2022)   Overall Financial Resource Strain (CARDIA)    Difficulty of Paying Living Expenses: Not hard at all  Food Insecurity: No Food Insecurity (10/06/2022)   Hunger Vital Sign    Worried About Running  Out of Food in the Last Year: Never true    Ran Out of Food in the Last Year: Never true  Transportation Needs: No Transportation Needs (10/06/2022)   PRAPARE - Administrator, Civil Service (Medical): No  Lack of Transportation (Non-Medical): No  Physical Activity: Sufficiently Active (10/06/2022)   Exercise Vital Sign    Days of Exercise per Week: 4 days    Minutes of Exercise per Session: 60 min  Stress: No Stress Concern Present (10/06/2022)   [employer] of Occupational Health - Occupational Stress Questionnaire    Feeling of Stress : Not at all  Social Connections: Moderately Isolated (10/06/2022)   Social Connection and Isolation Panel [NHANES]    Frequency of Communication with Friends and Family: More than three times a week    Frequency of Social Gatherings with Friends and Family: Twice a week    Attends Religious Services: Never    Database administrator or Organizations: No    Attends Engineer, structural: Never    Marital Status: Married    Tobacco Counseling Counseling given: Not Answered   Clinical Intake:  Pre-visit preparation completed: Yes  Pain : 0-10 Pain Score: 3  Pain Type: Chronic pain Pain Location: Back Pain Orientation: Lower     BMI - recorded: 25.68 Nutritional Status: BMI 25 -29 Overweight Nutritional Risks: None Diabetes: No  How often do you need to have someone help you when you read instructions, pamphlets, or other written materials from your doctor or pharmacy?: 1 - Never What is the last grade level you completed in school?: [employer]  Diabetic? No  Interpreter Needed?: No  Information entered by :: Decarla Siemen N. Clariza Sickman, LPN.   Activities of Daily Living    10/06/2022   10:08 AM 10/05/2022    2:07 PM  In your present state of health, do you have any difficulty performing the following activities:  Hearing? 0 0  Vision? 0 0  Difficulty concentrating or making decisions? 0 0  Walking or  climbing stairs? 0 0  Dressing or bathing? 0 0  Doing errands, shopping? 0 0  Preparing Food and eating ? N N  Using the Toilet? N N  In the past six months, have you accidently leaked urine? N N  Do you have problems with loss of bowel control? N N  Managing your Medications? N N  Managing your Finances? N N  Housekeeping or managing your Housekeeping? N N    Patient Care Team: Plotnikov, Georgina Quint, MD as PCP - General Wendall Stade, MD as PCP - Cardiology (Cardiology) Wendall Stade, MD (Cardiology) Mardella Layman, MD as Attending Physician (Gastroenterology) Ernesto Rutherford, MD (Ophthalmology) Rhea Belton, Carie Caddy, MD as Consulting Physician (Gastroenterology)  Indicate any recent Medical Services you may have received from other than [facility] providers in the past year (date may be approximate).     Assessment:   This is a routine wellness examination for Jeriko.  Hearing/Vision screen Hearing Screening - Comments:: Denies hearing difficulties   Vision Screening - Comments:: Wears reading glasses - up to date with routine eye exams with Ernesto Rutherford, MD.   Dietary issues and exercise activities discussed: Current Exercise Habits: Home exercise routine, Type of exercise: treadmill;strength training/weights, Time (Minutes): 60 (3.5 MILES), Frequency (Times/Week): 4, Weekly Exercise (Minutes/Week): 240, Intensity: Moderate, Exercise limited by: orthopedic condition(s)   Goals Addressed             This Visit's Progress    "No walker, No cane"        Depression Screen    10/06/2022   10:12 AM 09/02/2022    1:43 PM 05/17/2022    1:29 PM 10/02/2021   11:08 AM 04/16/2021  10:24 AM 09/11/2020   10:21 AM 04/01/2020    1:19 PM  PHQ 2/9 Scores  PHQ - 2 Score 0 0 0 0 0 0 0  PHQ- 9 Score 0    0      Fall Risk    10/06/2022   10:07 AM 10/05/2022    2:07 PM 09/02/2022    1:42 PM 05/17/2022    1:29 PM 10/02/2021   11:06 AM  Fall Risk   Falls in the past year? 0 0 0 0 0  Number  falls in past yr: 0 0 0 0 0  Injury with Fall? 0 0 0 0 0  Risk for fall due to : No Fall Risks  No Fall Risks No Fall Risks No Fall Risks  Follow up Falls prevention discussed  Falls evaluation completed Falls evaluation completed Falls evaluation completed    FALL RISK PREVENTION PERTAINING TO THE HOME:  Any stairs in or around the home? Yes  If so, are there any without handrails? No  Home free of loose throw rugs in walkways, pet beds, electrical cords, etc? Yes  Adequate lighting in your home to reduce risk of falls? Yes   ASSISTIVE DEVICES UTILIZED TO PREVENT FALLS:  Life alert? No  Use of a cane, walker or w/c? No  Grab bars in the bathroom? No  Shower chair or bench in shower? No  Elevated toilet seat or a handicapped toilet? No   TIMED UP AND GO:  Was the test performed? No . Telephonic Visit   Cognitive Function:        10/06/2022   10:11 AM 10/02/2021   11:19 AM  6CIT Screen  What Year? 0 points 0 points  What month? 0 points 0 points  What time? 0 points 0 points  Count back from 20 0 points 0 points  Months in reverse 0 points 0 points  Repeat phrase 0 points 0 points  Total Score 0 points 0 points    Immunizations Immunization History  Administered Date(s) Administered   Fluad Quad(high Dose 65+) 03/01/2019, 03/27/2020, 03/12/2021, 03/10/2022   Influenza Whole 04/14/2007, 06/05/2010   Influenza, High Dose Seasonal PF 05/14/2016, 02/26/2017   Influenza,inj,Quad PF,6+ Mos 05/16/2014   Influenza,trivalent, recombinat, inj, PF 12/22/2017   PFIZER(Purple Top)SARS-COV-2 Vaccination 05/07/2019, 05/27/2019   PNEUMOCOCCAL CONJUGATE-20 09/02/2022   Pneumococcal Conjugate-13 08/28/2013   Pneumococcal Polysaccharide-23 06/04/2009   Td 08/16/2011   Tdap 10/21/2016   Zoster Recombinat (Shingrix) 03/11/2017, 05/24/2017   Zoster, Live 06/05/2010    TDAP status: Up to date  Flu Vaccine status: Up to date  Pneumococcal vaccine status: Up to date  Covid-19  vaccine status: Completed vaccines  Qualifies for Shingles Vaccine? Yes   Zostavax completed Yes   Shingrix Completed?: Yes  Screening Tests Health Maintenance  Topic Date Due   COVID-19 Vaccine (3 - Pfizer risk series) 12/24/2022 (Originally 06/24/2019)   INFLUENZA VACCINE  11/25/2022   Medicare Annual Wellness (AWV)  10/06/2023   Colonoscopy  09/11/2026   DTaP/Tdap/Td (3 - Td or Tdap) 10/22/2026   Pneumonia Vaccine 65+ Years old  Completed   Zoster Vaccines- Shingrix  Completed   HPV VACCINES  Aged Out    Health Maintenance  There are no preventive care reminders to display for this patient.   Colorectal cancer screening: Type of screening: Colonoscopy. Completed 09/10/2021. Repeat every 5 years  Lung Cancer Screening: (Low Dose CT Chest recommended if Age 60-80 years, 30 pack-year currently smoking OR have quit w/in 15years.)  does not qualify.   Lung Cancer Screening Referral: no  Additional Screening:  Hepatitis C Screening: does not qualify; Completed: no  Vision Screening: Recommended annual ophthalmology exams for early detection of glaucoma and other disorders of the eye. Is the patient up to date with their annual eye exam?  Yes  Who is the provider or what is the name of the office in which the patient attends annual eye exams? Ernesto Rutherford, MD. If pt is not established with a provider, would they like to be referred to a provider to establish care? No .   Dental Screening: Recommended annual dental exams for proper oral hygiene  Community Resource Referral / Chronic Care Management: CRR required this visit?  No   CCM required this visit?  No      Plan:     I have personally reviewed and noted the following in the patient's chart:   Medical and social history Use of alcohol, tobacco or illicit drugs  Current medications and supplements including opioid prescriptions. Patient is not currently taking opioid prescriptions. Functional ability and  status Nutritional status Physical activity Advanced directives List of other physicians Hospitalizations, surgeries, and ER visits in previous 12 months Vitals Screenings to include cognitive, depression, and falls Referrals and appointments  In addition, I have reviewed and discussed with patient certain preventive protocols, quality metrics, and best practice recommendations. A written personalized care plan for preventive services as well as general preventive health recommendations were provided to patient.     Mickeal Needy, LPN   1/61/0960   Nurse Notes: Normal cognitive status assessed by direct observation via telephone conversation by this Nurse Health Advisor. No abnormalities found.  Medical screening examination/treatment/procedure(s) were performed by non-physician practitioner and as supervising physician I was immediately available for consultation/collaboration.  I agree with above. Jacinta Shoe, MD

## 2022-10-06 NOTE — Patient Instructions (Addendum)
[name] , Thank you for taking time to come for your Medicare Wellness Visit. I appreciate your ongoing commitment to your health goals. Please review the following plan we discussed and let me know if I can assist you in the future.   These are the goals we discussed:  Goals      "No walker, No cane"        This is a list of the screening recommended for you and due dates:  Health Maintenance  Topic Date Due   COVID-19 Vaccine (3 - Pfizer risk series) 12/24/2022*   Flu Shot  11/25/2022   Medicare Annual Wellness Visit  10/06/2023   Colon Cancer Screening  09/11/2026   DTaP/Tdap/Td vaccine (3 - Td or Tdap) 10/22/2026   Pneumonia Vaccine  Completed   Zoster (Shingles) Vaccine  Completed   HPV Vaccine  Aged Out  *Topic was postponed. The date shown is not the original due date.    Advanced directives: Advance directive discussed with you today. I have provided a copy for you to complete at home and have notarized. Once this is complete please bring a copy in to our office so we can scan it into your chart.  Conditions/risks identified: Yes  Next appointment: Follow up in one year for your annual wellness visit.   Preventive Care 82 Years and Older, Male  Preventive care refers to lifestyle choices and visits with your health care provider that can promote health and wellness. What does preventive care include? A yearly physical exam. This is also called an annual well check. Dental exams once or twice a year. Routine eye exams. Ask your health care provider how often you should have your eyes checked. Personal lifestyle choices, including: Daily care of your teeth and gums. Regular physical activity. Eating a healthy diet. Avoiding tobacco and drug use. Limiting alcohol use. Practicing safe sex. Taking low doses of aspirin every day. Taking vitamin and mineral supplements as recommended by your health care provider. What happens during an annual well check? The  services and screenings done by your health care provider during your annual well check will depend on your age, overall health, lifestyle risk factors, and family history of disease. Counseling  Your health care provider may ask you questions about your: Alcohol use. Tobacco use. Drug use. Emotional well-being. Home and relationship well-being. Sexual activity. Eating habits. History of falls. Memory and ability to understand (cognition). Work and work Astronomer. Screening  You may have the following tests or measurements: Height, weight, and BMI. Blood pressure. Lipid and cholesterol levels. These may be checked every 5 years, or more frequently if you are over 52 years old. Skin check. Lung cancer screening. You may have this screening every year starting at age 74 if you have a 30-pack-year history of smoking and currently smoke or have quit within the past 15 years. Fecal occult blood test (FOBT) of the stool. You may have this test every year starting at age 63. Flexible sigmoidoscopy or colonoscopy. You may have a sigmoidoscopy every 5 years or a colonoscopy every 10 years starting at age 71. Prostate cancer screening. Recommendations will vary depending on your family history and other risks. Hepatitis C blood test. Hepatitis B blood test. Sexually transmitted disease (STD) testing. Diabetes screening. This is done by checking your blood sugar (glucose) after you have not eaten for a while (fasting). You may have this done every 1-3 years. Abdominal aortic aneurysm (AAA) screening. You may need this if you  are a current or former smoker. Osteoporosis. You may be screened starting at age 48 if you are at high risk. Talk with your health care provider about your test results, treatment options, and if necessary, the need for more tests. Vaccines  Your health care provider may recommend certain vaccines, such as: Influenza vaccine. This is recommended every year. Tetanus,  diphtheria, and acellular pertussis (Tdap, Td) vaccine. You may need a Td booster every 10 years. Zoster vaccine. You may need this after age 51. Pneumococcal 13-valent conjugate (PCV13) vaccine. One dose is recommended after age 76. Pneumococcal polysaccharide (PPSV23) vaccine. One dose is recommended after age 36. Talk to your health care provider about which screenings and vaccines you need and how often you need them. This information is not intended to replace advice given to you by your health care provider. Make sure you discuss any questions you have with your health care provider. Document Released: 05/09/2015 Document Revised: 12/31/2015 Document Reviewed: 02/11/2015 Elsevier Interactive Patient Education  2017 [employer].  Fall Prevention in the Home Falls can cause injuries. They can happen to people of all ages. There are many things you can do to make your home safe and to help prevent falls. What can I do on the outside of my home? Regularly fix the edges of walkways and driveways and fix any cracks. Remove anything that might make you trip as you walk through a door, such as a raised step or threshold. Trim any bushes or trees on the path to your home. Use bright outdoor lighting. Clear any walking paths of anything that might make someone trip, such as rocks or tools. Regularly check to see if handrails are loose or broken. Make sure that both sides of any steps have handrails. Any raised decks and porches should have guardrails on the edges. Have any leaves, snow, or ice cleared regularly. Use sand or salt on walking paths during winter. Clean up any spills in your garage right away. This includes oil or grease spills. What can I do in the bathroom? Use night lights. Install grab bars by the toilet and in the tub and shower. Do not use towel bars as grab bars. Use non-skid mats or decals in the tub or shower. If you need to sit down in the shower, use a plastic,  non-slip stool. Keep the floor dry. Clean up any water that spills on the floor as soon as it happens. Remove soap buildup in the tub or shower regularly. Attach bath mats securely with double-sided non-slip rug tape. Do not have throw rugs and other things on the floor that can make you trip. What can I do in the bedroom? Use night lights. Make sure that you have a light by your bed that is easy to reach. Do not use any sheets or blankets that are too big for your bed. They should not hang down onto the floor. Have a firm chair that has side arms. You can use this for support while you get dressed. Do not have throw rugs and other things on the floor that can make you trip. What can I do in the kitchen? Clean up any spills right away. Avoid walking on wet floors. Keep items that you use a lot in easy-to-reach places. If you need to reach something above you, use a strong step stool that has a grab bar. Keep electrical cords out of the way. Do not use floor polish or wax that makes floors slippery. If you  must use wax, use non-skid floor wax. Do not have throw rugs and other things on the floor that can make you trip. What can I do with my stairs? Do not leave any items on the stairs. Make sure that there are handrails on both sides of the stairs and use them. Fix handrails that are broken or loose. Make sure that handrails are as long as the stairways. Check any carpeting to make sure that it is firmly attached to the stairs. Fix any carpet that is loose or worn. Avoid having throw rugs at the top or bottom of the stairs. If you do have throw rugs, attach them to the floor with carpet tape. Make sure that you have a light switch at the top of the stairs and the bottom of the stairs. If you do not have them, ask someone to add them for you. What else can I do to help prevent falls? Wear shoes that: Do not have high heels. Have rubber bottoms. Are comfortable and fit you well. Are closed  at the toe. Do not wear sandals. If you use a stepladder: Make sure that it is fully opened. Do not climb a closed stepladder. Make sure that both sides of the stepladder are locked into place. Ask someone to hold it for you, if possible. Clearly mark and make sure that you can see: Any grab bars or handrails. First and last steps. Where the edge of each step is. Use tools that help you move around (mobility aids) if they are needed. These include: Canes. Walkers. Scooters. Crutches. Turn on the lights when you go into a dark area. Replace any light bulbs as soon as they burn out. Set up your furniture so you have a clear path. Avoid moving your furniture around. If any of your floors are uneven, fix them. If there are any pets around you, be aware of where they are. Review your medicines with your doctor. Some medicines can make you feel dizzy. This can increase your chance of falling. Ask your doctor what other things that you can do to help prevent falls. This information is not intended to replace advice given to you by your health care provider. Make sure you discuss any questions you have with your health care provider. Document Released: 02/06/2009 Document Revised: 09/18/2015 Document Reviewed: 05/17/2014 Elsevier Interactive Patient Education  2017 [employer].

## 2022-10-14 NOTE — Progress Notes (Signed)
Date:  10/25/2022   ID:  [name] [name], DOB [birth year], MRN [id number]  PCP:  Tresa Garter, MD  Cardiologist:   Eden Emms Electrophysiologist:  None   Evaluation Performed:  Follow-Up Visit  Chief Complaint:  CAD  History of Present Illness:    82 y.o. with CAD. Known CTO RCA, Previous cutting balloon D1 latter occluded in 2008. Last cath 2011 with 70% mid LAD Rx medically by Dr Juanda Chance.  Last myovue 12/11/19 old IMI no ischemia EF preserved 68% no change from July 2018 . Does taxes Active with no angina    Son who is a priest getting an assignment in [place] with cloistered nuns Now back and both boys Are in [place] Other son is Clinical research associate  Weight is down No angina. 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 AVM may need laser Rx if recurs Off iron   Past Medical History:  Diagnosis Date   Allergic rhinitis    Allergy    Anemia    Arthritis    in neck   CAD (coronary artery disease)    Cataract    Had surg 08/2016   CERUMEN IMPACTION 06/04/2009   5/15     Clotting disorder (HCC)    Colon polyp    Diverticulosis    Family hx of colon cancer    brother   Hyperlipemia    Hypertension    Hypothyroidism    MI (myocardial infarction) (HCC) 1993/2007   no stents   OSA (obstructive sleep apnea)    Psoriasis    Sleep apnea    no cpap- had sleep study per pt no OSA   Past Surgical History:  Procedure Laterality Date   CARDIAC CATHETERIZATION  1993   COLONOSCOPY     POLYPECTOMY     PTCA     TONSILLECTOMY          Allergies:   Patient has no known allergies.   Social History   Tobacco Use   Smoking status: Former    Types: Cigarettes    Quit date: 04/1978    Years since quitting: 44.5   Smokeless tobacco: Never  Vaping Use   Vaping Use: Never used  Substance Use Topics   Alcohol use: Yes    Alcohol/week:  6.0 standard drinks of alcohol    Types: 6 Cans of beer per week   Drug use: No     Family Hx: The patient's family history includes Bone cancer in his brother; COPD in his father; Cancer in his brother; Colon cancer in his brother, maternal aunt, maternal aunt, maternal uncle, paternal aunt, paternal grandfather, and another family member; Colon polyps in his sister; Coronary artery disease in his mother; Heart disease in his mother; Hypertension in an other family member. There is no history of Esophageal cancer, Rectal cancer, Stomach cancer, or Pancreatic cancer.  ROS:   Please see the history of present illness.     All other systems reviewed and are negative.   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  10/25/2022 rate 62   Recent Labs: 09/02/2022: ALT 31; BUN 11; Creatinine, Ser 1.05; Hemoglobin 14.4; Platelets 228.0; Potassium  4.5; Sodium 138; TSH 6.96   Recent Lipid Panel Lab Results  Component Value Date/Time   CHOL 135 09/02/2022 02:20 PM   TRIG 81.0 09/02/2022 02:20 PM   TRIG 52 03/21/2006 08:20 AM   HDL 48.20 09/02/2022 02:20 PM   CHOLHDL 3 09/02/2022 02:20 PM   LDLCALC 71 09/02/2022 02:20 PM    Wt Readings from Last 3 Encounters:  10/25/22 208 lb (94.3 kg)  10/06/22 200 lb (90.7 kg)  09/02/22 205 lb (93 kg)     Objective:    Vital Signs:  BP 136/72   Pulse 65   Ht 6\' 1"  (1.854 m)   Wt 208 lb (94.3 kg)   SpO2 98%   BMI 27.44 kg/m    Skin warm and dry No distress No tachypnea No JVP elevation  Neuro appears non focal No edema  Telephone visit no exam   ASSESSMENT & PLAN:    CAD: Occluded D1 and RCA with moderate LAD disease non ischemic myovue 12/11/19  Continue medical RX  Very active with no angina    HTN: Well controlled.  Continue current medications and low sodium Dash type diet.     Thyroid  Continue replacement TSH 6.9  09/02/22   HLD:  Continue meds LDL 71 09/02/22 on simvastatin 40 mg daily   PVD:  Calcium seen on lumbar film f/u US with >50% left iliac disease no claudication observe Carotids plaque no stenosis on [place] 03/15/19   RBBB:  abnormal ECG stable yearly ECG   COVID-19 Education: The signs and symptoms of COVID-19 were discussed with the patient and how to seek care for testing (follow up with PCP or arrange E-visit).  The importance of social distancing was discussed today.  Time:   Today, I have spent 30 minutes with the patient with telehealth technology discussing the above problems.  This includes chart review , stress test, lab review and composing note    Medication Adjustments/Labs and Tests Ordered: Current medicines are reviewed at length with the patient today.  Concerns regarding medicines are outlined above.   Tests Ordered:  None   Medication Changes:  None   Disposition:  Follow up in a year   Signed, Charlton Haws, MD  10/25/2022 9:05 AM    [facility]

## 2022-10-25 ENCOUNTER — Encounter: Payer: Self-pay | Admitting: Cardiovascular Disease

## 2022-10-25 ENCOUNTER — Ambulatory Visit: Payer: Medicare Other | Attending: Cardiovascular Disease | Admitting: Cardiovascular Disease

## 2022-10-25 VITALS — BP 136/72 | HR 65 | Ht 73.0 in | Wt 208.0 lb

## 2022-10-25 DIAGNOSIS — E782 Mixed hyperlipidemia: Secondary | ICD-10-CM | POA: Diagnosis not present

## 2022-10-25 DIAGNOSIS — I10 Essential (primary) hypertension: Secondary | ICD-10-CM | POA: Insufficient documentation

## 2022-10-25 DIAGNOSIS — I2510 Atherosclerotic heart disease of native coronary artery without angina pectoris: Secondary | ICD-10-CM | POA: Insufficient documentation

## 2022-10-25 NOTE — Patient Instructions (Signed)
Medication Instructions:  Your physician recommends that you continue on your current medications as directed. Please refer to the Current Medication list given to you today.  *If you need a refill on your cardiac medications before your next appointment, please call your pharmacy*  Lab Work: If you have labs (blood work) drawn today and your tests are completely normal, you will receive your results only by: MyChart Message (if you have MyChart) OR A paper copy in the mail If you have any lab test that is abnormal or we need to change your treatment, we will call you to review the results.  Testing/Procedures: None ordered today.  Follow-Up: At [facility], you and your health needs are our priority.  As part of our continuing mission to provide you with exceptional heart care, we have created designated Provider Care Teams.  These Care Teams include your primary Cardiologist (physician) and Advanced Practice Providers (APPs -  Physician Assistants and Nurse Practitioners) who all work together to provide you with the care you need, when you need it.  We recommend signing up for the patient portal called "MyChart".  Sign up information is provided on this After Visit Summary.  MyChart is used to connect with patients for Virtual Visits (Telemedicine).  Patients are able to view lab/test results, encounter notes, upcoming appointments, etc.  Non-urgent messages can be sent to your provider as well.   To learn more about what you can do with MyChart, go to [email or web address].    Your next appointment:   1 year(s)  Provider:   Peter Nishan, MD      

## 2022-11-04 ENCOUNTER — Other Ambulatory Visit: Payer: Self-pay | Admitting: Internal Medicine

## 2022-11-09 DIAGNOSIS — H02834 Dermatochalasis of left upper eyelid: Secondary | ICD-10-CM | POA: Diagnosis not present

## 2022-11-09 DIAGNOSIS — Z961 Presence of intraocular lens: Secondary | ICD-10-CM | POA: Diagnosis not present

## 2022-11-09 DIAGNOSIS — H02831 Dermatochalasis of right upper eyelid: Secondary | ICD-10-CM | POA: Diagnosis not present

## 2022-11-09 DIAGNOSIS — H43811 Vitreous degeneration, right eye: Secondary | ICD-10-CM | POA: Diagnosis not present

## 2022-11-09 DIAGNOSIS — H353111 Nonexudative age-related macular degeneration, right eye, early dry stage: Secondary | ICD-10-CM | POA: Diagnosis not present

## 2022-11-09 DIAGNOSIS — H26493 Other secondary cataract, bilateral: Secondary | ICD-10-CM | POA: Diagnosis not present

## 2022-11-30 ENCOUNTER — Encounter: Payer: Self-pay | Admitting: Internal Medicine

## 2022-11-30 ENCOUNTER — Ambulatory Visit (INDEPENDENT_AMBULATORY_CARE_PROVIDER_SITE_OTHER): Payer: Medicare Other | Admitting: Internal Medicine

## 2022-11-30 VITALS — BP 120/70 | HR 62 | Temp 98.6°F | Ht 73.0 in | Wt 209.0 lb

## 2022-11-30 DIAGNOSIS — E034 Atrophy of thyroid (acquired): Secondary | ICD-10-CM

## 2022-11-30 DIAGNOSIS — H539 Unspecified visual disturbance: Secondary | ICD-10-CM | POA: Diagnosis not present

## 2022-11-30 DIAGNOSIS — G4452 New daily persistent headache (NDPH): Secondary | ICD-10-CM | POA: Diagnosis not present

## 2022-11-30 DIAGNOSIS — J0100 Acute maxillary sinusitis, unspecified: Secondary | ICD-10-CM | POA: Diagnosis not present

## 2022-11-30 DIAGNOSIS — J329 Chronic sinusitis, unspecified: Secondary | ICD-10-CM | POA: Insufficient documentation

## 2022-11-30 DIAGNOSIS — R519 Headache, unspecified: Secondary | ICD-10-CM | POA: Insufficient documentation

## 2022-11-30 NOTE — Progress Notes (Signed)
Subjective:  Patient ID: [name] [name], male    DOB: 10-Sep-1940  Age: 82 y.o. MRN: [id number]  CC: Facial Pain (Sinus pressure, headaches .[name] Pt has had a tear in rt eye not of retina which has been cleared. Pts tooth broke and was removed as well during this time frame of onset of above sxs.)   HPI [name] [name] presents for dull pain 2/10 over B eyes lasting for 2-3 seconds 2-3 episodes /hr, pain has been changing locations -over right nose, over an eye, over the forehead.  Tylenol usually helps. Pt saw Dr Dione Booze for retinal detachment 3 months ago  [name] [name] has been having a problem with the broken molar - extracted in April. 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 Surgery is pending to place an implant into repair his right maxillary sinus.  Outpatient Medications Prior to Visit  Medication Sig Dispense Refill   acetaminophen (TYLENOL) 500 MG tablet Take 500 mg by mouth every 6 (six) hours as needed.     aspirin 81 MG tablet Take 81 mg by mouth daily.     azelastine (OPTIVAR) 0.05 % ophthalmic solution Place 1 drop into both eyes 2 (two) times daily. 6 mL 6   cholecalciferol (VITAMIN D) 1000 UNITS tablet Take 4,000 Units by mouth daily.      clopidogrel (PLAVIX) 75 MG tablet TAKE 1 TABLET BY MOUTH EVERY DAY 90 tablet 0   diclofenac Sodium (VOLTAREN) 1 % GEL Apply 1 application topically 4 (four) times daily. 100 g 3   halobetasol (ULTRAVATE) 0.05 % cream Apply topically 2 (two) times daily. 50 g 2   levothyroxine (SYNTHROID) 112 MCG tablet Take 1 tablet (112 mcg total) by mouth daily. 90 tablet 3   losartan (COZAAR) 100 MG tablet TAKE 1 TABLET BY MOUTH EVERY DAY 90 tablet 1   meloxicam (MOBIC) 15 MG tablet Take 1 tablet (15 mg total) by mouth daily. 15 tablet 0   metoprolol succinate (TOPROL-XL) 25 MG 24 hr tablet TAKE 1 TABLET BY MOUTH EVERY DAY (NEED OFFICE VISIT) 90 tablet 1   Multiple Vitamins-Minerals (CENTRUM SILVER PO) Take 1  tablet by mouth daily.     niacin 500 MG CR capsule Take 2 capsules (1,000 mg total) by mouth at bedtime. 100 capsule 3   nitroGLYCERIN (NITROSTAT) 0.4 MG SL tablet Place 1 tablet (0.4 mg total) under the tongue every 5 (five) minutes as needed for chest pain (3 doses MAX). 25 tablet 4   Omega-3 Fatty Acids (FISH OIL) 1200 MG CAPS Take 1 capsule by mouth daily.     pantoprazole (PROTONIX) 40 MG tablet Take 1 tablet (40 mg total) by mouth every morning. 90 tablet 3   potassium chloride (KLOR-CON) 10 MEQ tablet TAKE 1 TABLET BY MOUTH EVERY DAY 90 tablet 1   simvastatin (ZOCOR) 40 MG tablet TAKE 1 TABLET BY MOUTH EVERY DAY 90 tablet 3   sulfamethoxazole-trimethoprim (BACTRIM DS) 800-160 MG tablet Take 1 tablet by mouth 2 (two) times daily. 20 tablet 0   No facility-administered medications prior to visit.    ROS: Review of Systems  Constitutional:  Negative for appetite change, fatigue and unexpected weight change.  HENT:  Negative for congestion, nosebleeds, sneezing, sore throat and trouble swallowing.   Eyes:  Negative for itching and visual disturbance.  Respiratory:  Negative for cough.   Cardiovascular:  Negative for chest pain, palpitations and leg swelling.  Gastrointestinal:  Negative  for abdominal distention, blood in stool, diarrhea and nausea.  Genitourinary:  Negative for frequency and hematuria.  Musculoskeletal:  Negative for back pain, gait problem, joint swelling and neck pain.  Skin:  Negative for rash.  Neurological:  Negative for dizziness, tremors, speech difficulty and weakness.  Psychiatric/Behavioral:  Negative for agitation, dysphoric mood and sleep disturbance. The patient is not nervous/anxious.     Objective:  BP 120/70 (BP Location: Right Arm, Patient Position: Sitting, Cuff Size: Large)   Pulse 62   Temp 98.6 F (37 C) (Oral)   Ht 6\' 1"  (1.854 m)   Wt 209 lb (94.8 kg)   SpO2 96%   BMI 27.57 kg/m   BP Readings from Last 3 Encounters:  11/30/22 120/70   10/25/22 136/72  09/02/22 128/68    Wt Readings from Last 3 Encounters:  11/30/22 209 lb (94.8 kg)  10/25/22 208 lb (94.3 kg)  10/06/22 200 lb (90.7 kg)    Physical Exam Constitutional:      General: He is not in acute distress.    Appearance: He is well-developed.     Comments: NAD  Eyes:     Conjunctiva/sclera: Conjunctivae normal.     Pupils: Pupils are equal, round, and reactive to light.  Neck:     Thyroid: No thyromegaly.     Vascular: No JVD.  Cardiovascular:     Rate and Rhythm: Normal rate and regular rhythm.     Heart sounds: Normal heart sounds. No murmur heard.    No friction rub. No gallop.  Pulmonary:     Effort: Pulmonary effort is normal. No respiratory distress.     Breath sounds: Normal breath sounds. No wheezing or rales.  Chest:     Chest wall: No tenderness.  Abdominal:     General: Bowel sounds are normal. There is no distension.     Palpations: Abdomen is soft. There is no mass.     Tenderness: There is no abdominal tenderness. There is no guarding or rebound.  Musculoskeletal:        General: No tenderness. Normal range of motion.     Cervical back: Normal range of motion.  Lymphadenopathy:     Cervical: No cervical adenopathy.  Skin:    General: Skin is warm and dry.     Findings: No rash.  Neurological:     Mental Status: He is alert and oriented to person, place, and time.     Cranial Nerves: No cranial nerve deficit.     Motor: No abnormal muscle tone.     Coordination: Coordination normal.     Gait: Gait normal.     Deep Tendon Reflexes: Reflexes are normal and symmetric.  Psychiatric:        Behavior: Behavior normal.        Thought Content: Thought content normal.        Judgment: Judgment normal.   Nonfocal Temples NT B Romberg (-)  Lab Results  Component Value Date   WBC 7.4 09/02/2022   HGB 14.4 09/02/2022   HCT 42.5 09/02/2022   PLT 228.0 09/02/2022   GLUCOSE 96 09/02/2022   CHOL 135 09/02/2022   TRIG 81.0  09/02/2022   HDL 48.20 09/02/2022   LDLCALC 71 09/02/2022   ALT 31 09/02/2022   AST 29 09/02/2022   NA 138 09/02/2022   K 4.5 09/02/2022   CL 99 09/02/2022   CREATININE 1.05 09/02/2022   BUN 11 09/02/2022   CO2 28 09/02/2022   TSH  6.96 (H) 09/02/2022   PSA 0.75 09/02/2022   INR 1.0 ratio 03/06/2010   HGBA1C 5.8 08/28/2013    US RENAL  Result Date: 06/07/2022 CLINICAL DATA:  Decreased GFR. EXAM: RENAL / URINARY TRACT ULTRASOUND COMPLETE COMPARISON:  None Available. FINDINGS: Right Kidney: Renal measurements: 12.7 x 5.5 x 4.9 centimeters = volume: 179 mL. Echogenicity and renal parenchymal thickness are normal. No hydronephrosis. LOWER pole cyst is 3.8 x 2.8 x 3.2 centimeters. No specific imaging follow-up is needed for this finding. Left Kidney: Renal measurements: 12.8 x 5.4 x 5.6 centimeters = volume: 201 mL. Echogenicity within normal limits. No mass or hydronephrosis visualized. Bladder: Appears normal for degree of bladder distention. Bilateral ureteral jets are present. Other: None. IMPRESSION: No hydronephrosis or suspicious renal mass. Electronically Signed   By: Norva Pavlov M.D.   On: 06/07/2022 14:52    Assessment & Plan:   Problem List Items Addressed This Visit     Hypothyroidism    Check TSH, free T4      Relevant Orders   TSH   T4, free   Headache - Primary     New dull pain 2/10 over B eyes lasting for 2-3 seconds 2-3 episodes /hr, pain has been changing locations -over right nose, over an eye, over the forehead.  Tylenol usually helps. Pt saw Dr Dione Booze for retinal detachment 3 months ago  [name] [name] has been having a problem with the broken molar - extracted in April. 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 Surgery is pending to place an implant into repair his right maxillary sinus.  Head CT ESR, other labs No clear evidence of temporal arthritis.      Relevant Orders   CT HEAD WO CONTRAST ( )   CBC with  Differential/Platelet   Comprehensive metabolic panel   Sedimentation rate   Sinusitis nasal    Oral surgery is pendiing      Relevant Orders   CT HEAD WO CONTRAST ( )   Vision disturbance    New. Pt saw Dr Dione Booze for retinal detachment 3 months ago      Relevant Orders   CT HEAD WO CONTRAST ( )   CBC with Differential/Platelet   Comprehensive metabolic panel   Sedimentation rate      No orders of the defined types were placed in this encounter.     Follow-up: Return in about 2 weeks (around 12/14/2022) for a follow-up visit.  Sonda Primes, MD

## 2022-11-30 NOTE — Assessment & Plan Note (Signed)
Oral surgery is pendiing

## 2022-11-30 NOTE — Assessment & Plan Note (Addendum)
New dull pain 2/10 over B eyes lasting for 2-3 seconds 2-3 episodes /hr, pain has been changing locations -over right nose, over an eye, over the forehead.  Tylenol usually helps. Pt saw Dr Dione Booze for retinal detachment 3 months ago  [name] [name] has been having a problem with the broken molar - extracted in April. 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 Surgery is pending to place an implant into repair his right maxillary sinus.  Head CT ESR, other labs No clear evidence of temporal arthritis.

## 2022-11-30 NOTE — Assessment & Plan Note (Addendum)
New. Pt saw Dr Dione Booze for retinal detachment 3 months ago

## 2022-11-30 NOTE — Assessment & Plan Note (Signed)
Check TSH, free T4

## 2022-12-01 ENCOUNTER — Ambulatory Visit
Admission: RE | Admit: 2022-12-01 | Discharge: 2022-12-01 | Disposition: A | Discharge status: Home / Self Care | Payer: Medicare Other | Source: Ambulatory Visit | Attending: Internal Medicine | Admitting: Internal Medicine

## 2022-12-01 DIAGNOSIS — H539 Unspecified visual disturbance: Secondary | ICD-10-CM | POA: Diagnosis not present

## 2022-12-01 DIAGNOSIS — G4452 New daily persistent headache (NDPH): Secondary | ICD-10-CM

## 2022-12-01 DIAGNOSIS — R519 Headache, unspecified: Secondary | ICD-10-CM | POA: Diagnosis not present

## 2022-12-01 DIAGNOSIS — J0100 Acute maxillary sinusitis, unspecified: Secondary | ICD-10-CM

## 2022-12-06 ENCOUNTER — Encounter: Payer: Self-pay | Admitting: Internal Medicine

## 2022-12-07 ENCOUNTER — Other Ambulatory Visit: Payer: Self-pay | Admitting: Internal Medicine

## 2022-12-15 ENCOUNTER — Ambulatory Visit: Payer: Medicare Other | Admitting: Internal Medicine

## 2023-01-28 ENCOUNTER — Other Ambulatory Visit: Payer: Self-pay | Admitting: Internal Medicine

## 2023-02-26 ENCOUNTER — Other Ambulatory Visit: Payer: Self-pay | Admitting: Internal Medicine

## 2023-03-08 ENCOUNTER — Ambulatory Visit: Payer: Medicare Other

## 2023-03-08 DIAGNOSIS — Z23 Encounter for immunization: Secondary | ICD-10-CM | POA: Diagnosis not present

## 2023-03-08 NOTE — Progress Notes (Signed)
PT visits today for their high dose flu vaccine. PT was informed of what they were receiving and tolerated injection well. PT was told to reach out to office if needed.

## 2023-03-19 ENCOUNTER — Other Ambulatory Visit: Payer: Self-pay | Admitting: Internal Medicine

## 2023-04-04 DIAGNOSIS — M2011 Hallux valgus (acquired), right foot: Secondary | ICD-10-CM | POA: Diagnosis not present

## 2023-04-04 DIAGNOSIS — B351 Tinea unguium: Secondary | ICD-10-CM | POA: Diagnosis not present

## 2023-04-04 DIAGNOSIS — M2012 Hallux valgus (acquired), left foot: Secondary | ICD-10-CM | POA: Diagnosis not present

## 2023-04-04 DIAGNOSIS — L603 Nail dystrophy: Secondary | ICD-10-CM | POA: Diagnosis not present

## 2023-04-04 DIAGNOSIS — M216X2 Other acquired deformities of left foot: Secondary | ICD-10-CM | POA: Diagnosis not present

## 2023-04-04 DIAGNOSIS — M216X1 Other acquired deformities of right foot: Secondary | ICD-10-CM | POA: Diagnosis not present

## 2023-04-04 DIAGNOSIS — I70203 Unspecified atherosclerosis of native arteries of extremities, bilateral legs: Secondary | ICD-10-CM | POA: Diagnosis not present

## 2023-04-24 DIAGNOSIS — L603 Nail dystrophy: Secondary | ICD-10-CM | POA: Diagnosis not present

## 2023-05-02 DIAGNOSIS — M2012 Hallux valgus (acquired), left foot: Secondary | ICD-10-CM | POA: Diagnosis not present

## 2023-05-02 DIAGNOSIS — M216X2 Other acquired deformities of left foot: Secondary | ICD-10-CM | POA: Diagnosis not present

## 2023-05-02 DIAGNOSIS — B351 Tinea unguium: Secondary | ICD-10-CM | POA: Diagnosis not present

## 2023-05-02 DIAGNOSIS — I70203 Unspecified atherosclerosis of native arteries of extremities, bilateral legs: Secondary | ICD-10-CM | POA: Diagnosis not present

## 2023-05-02 DIAGNOSIS — M2011 Hallux valgus (acquired), right foot: Secondary | ICD-10-CM | POA: Diagnosis not present

## 2023-05-02 DIAGNOSIS — L603 Nail dystrophy: Secondary | ICD-10-CM | POA: Diagnosis not present

## 2023-05-02 DIAGNOSIS — M216X1 Other acquired deformities of right foot: Secondary | ICD-10-CM | POA: Diagnosis not present

## 2023-05-11 ENCOUNTER — Other Ambulatory Visit: Payer: Self-pay | Admitting: Internal Medicine

## 2023-08-02 ENCOUNTER — Encounter: Payer: Self-pay | Admitting: Podiatry

## 2023-08-02 ENCOUNTER — Ambulatory Visit (INDEPENDENT_AMBULATORY_CARE_PROVIDER_SITE_OTHER): Admitting: Podiatry

## 2023-08-02 ENCOUNTER — Ambulatory Visit (INDEPENDENT_AMBULATORY_CARE_PROVIDER_SITE_OTHER)

## 2023-08-02 DIAGNOSIS — M79671 Pain in right foot: Secondary | ICD-10-CM

## 2023-08-02 DIAGNOSIS — M2041 Other hammer toe(s) (acquired), right foot: Secondary | ICD-10-CM | POA: Diagnosis not present

## 2023-08-02 DIAGNOSIS — M7731 Calcaneal spur, right foot: Secondary | ICD-10-CM

## 2023-08-02 MED ORDER — LIDOCAINE 5 % EX OINT
1.0000 | TOPICAL_OINTMENT | CUTANEOUS | 0 refills | Status: AC | PRN
Start: 2023-08-02 — End: ?

## 2023-08-02 NOTE — Progress Notes (Unsigned)
 Subjective:  Patient ID: [name] [name], male    DOB: Sep 17, 1940,  MRN: [id number]  Chief Complaint  Patient presents with   Callouses    Whether or not my heel pain is related to a corn on a toe that is also hurting.    Discussed the use of AI scribe software for clinical note transcription with the patient, who gave verbal consent to proceed.  History of Present Illness [name] [name] is a 83 year old male who presents with heel pain.  He has been experiencing heel pain on the outside of his right foot for approximately one week. The pain is described as an 'electrical charge' sensation, particularly occurring at night and disturbing his sleep. It is sharp, lasting for a second or two before subsiding, but recurs unpredictably, causing sleep disturbances. The pain is alleviated when he positions his feet at a 45-degree angle in a recliner. During the day, while at work, he feels the sensation but does not experience the sharp pains. He has used tramadol, which he had from a dental procedure, to ease the pain, taking the last dose on Saturday. Since then, the intensity of the pain has decreased, allowing him to sleep better. He recalls a similar episode approximately two years ago, which resolved with the application of aspirin cream. No pain or soreness upon direct pressure to the area currently, and no pain while walking during the day.  Additionally, he mentions having a painful corn located between his toes, which he believes is unrelated to the heel pain. He does not use any toe spacers currently.  His past medical history includes a heart attack at age 44, and he is on a blood thinner, though he cannot recall the name. He denies smoking and has no history of diabetes.      Objective:    Physical Exam VASCULAR: DP and PT pulse palpable. Foot is warm and well-perfused. Capillary fill time is brisk. DERMATOLOGIC: Normal skin turgor, texture, and temperature. No open lesions,  rashes, or ulcerations. NEUROLOGIC: Normal sensation to light touch and pressure. No paresthesias. ORTHOPEDIC: Smooth pain-free range of motion of all examined joints. No ecchymosis or bruising. No gross deformity. No pain to palpation. Palpable posterior heel spur not tender to palpation. Adducto varus fifth toe with painful corn on lateral fourth PIPJ. Achilles tendon intact, no tear.   No images are attached to the encounter.    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 Inflammatory heel pain, right      Plan:  Patient was evaluated and treated and all questions answered.  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 Surgical correction is considered effective for long-term relief. - Discuss surgical correction of toe deformities to remove the corn and alleviate pain. - Schedule surgery for correction of toe deformities, including removal of part of the knuckle on the fourth and fifth toes. - Obtain cardiologist clearance for surgery due to history of myocardial infarction and current use of anticoagulants. - Discuss potential need for hospital setting surgery due to cardiac history. - Inform about risks of surgery including infection, recurrence, and temporary cessation of anticoagulants.  Heel neuritis Intermittent sharp pain in the right heel, described as  an Psychologist, occupational, primarily at night, likely due to nerve pressure in the heel area, possibly exacerbated by lying down. No pain during walking, indicating the Achilles tendon is not involved. Symptoms have improved with tramadol and positioning the foot to relieve pressure. Lidocaine ointment  is recommended for symptomatic relief. - Prescribe lidocaine ointment for topical application to the affected area when pain occurs. - Advise positioning the foot with a pillow under the calf to elevate the heel and reduce pressure. - Consider injection therapy if symptoms persist and become consistent.  Myocardial infarction Myocardial infarction at age 83, with collateral circulation compensating for a blocked artery. No recent cardiac events reported. He is on anticoagulants. Cardiologist clearance is necessary for any surgical procedures. - Ensure cardiologist clearance prior to any surgical procedures. - Discuss potential risks of stopping anticoagulants for surgery, including increased risk of myocardial infarction or stroke.      No follow-ups on file.

## 2023-08-09 ENCOUNTER — Encounter: Payer: Self-pay | Admitting: Podiatry

## 2023-08-10 ENCOUNTER — Telehealth: Payer: Self-pay | Admitting: Podiatry

## 2023-08-10 ENCOUNTER — Telehealth: Payer: Self-pay

## 2023-08-10 NOTE — Telephone Encounter (Signed)
   Pre-operative Risk Assessment    Patient Name: [name] [name]  DOB: Oct 27, 1940 MRN: [id number]   Date of last office visit: 10/25/22 Date of next office visit: Not sche duled  Request for Surgical Clearance    Procedure:   Right hammertoe repair  Date of Surgery:  Clearance 09/23/23                                Surgeon:  Not scheduled Surgeon's Group or Practice Name:  [facility] Phone number:  [phone] Fax number:  [phone]   Type of Clearance Requested:   - Medical    Type of Anesthesia:  General    Additional requests/questions:    [name]   08/10/2023, 11:34 AM

## 2023-08-10 NOTE — Telephone Encounter (Signed)
 Please contact requesting provider office to inquire if guidance is needed to hold ASA and Plavix prior to procedure.  Thanks

## 2023-08-10 NOTE — Telephone Encounter (Signed)
 Left message for surgery scheduler to call back and confirm if ASA and Plavix will need to be held for surgery.

## 2023-08-10 NOTE — Telephone Encounter (Signed)
 Received voicemail from [place] at Dr Stann Earnest( cardiologist) office we were trying to get cardiac Clearance for surgery on 09/23/23 and she is asking if [name] [name] should hold his plavix and Asprin.   Upon checking PCP gave [name] [name] the plavix. Please advise

## 2023-08-11 ENCOUNTER — Telehealth: Payer: Self-pay | Admitting: *Deleted

## 2023-08-11 NOTE — Telephone Encounter (Signed)
   Name: [name] [name]  DOB: 1940/09/24  MRN: [id number]  Primary Cardiologist: Janelle Mediate, MD   Preoperative team, please contact this patient and set up a phone call appointment for further preoperative risk assessment. Please obtain consent and complete medication review. Thank you for your help.  I confirm that guidance regarding antiplatelet and oral anticoagulation therapy has been completed and, if necessary, noted below.  Patient is asymptomatic at time of visit he may hold Plavix 5 days prior to procedure and continue ASA 81 mg through the perioperative period due to previous history of CTO of RCA.  I also confirmed the patient resides in the state of [place] . As per [facility] Medical Board telemedicine laws, the patient must reside in the state in which the provider is licensed.   Francene Ing, Retha Cast, NP 08/11/2023, 11:12 AM [facility]

## 2023-08-11 NOTE — Telephone Encounter (Signed)
 Surgeon's office called back and confirmed that Dr. Michalene Agee, DPM would like the ok to hold ASA and Plavix x 1 week prior to procedure.

## 2023-08-11 NOTE — Telephone Encounter (Signed)
 Left message for [name] [name] at Dr Stann Earnest office in that Dr Michalene Agee would like pt to hold his apsrin and plavix 1 week prior to the surgery if that is ok with Dr Stann Earnest. Please advise.

## 2023-08-11 NOTE — Telephone Encounter (Signed)
 Pt has been scheduled tele preop appt 09/07/23. Med rec and consent are done.

## 2023-08-11 NOTE — Telephone Encounter (Signed)
 Pt has been scheduled tele preop appt 09/07/23. Med rec and consent are done.      Patient Consent for Virtual Visit        [name] [name] has provided verbal consent on 08/11/2023 for a virtual visit (video or telephone).   CONSENT FOR VIRTUAL VISIT FOR:  [name] [name]  By participating in this virtual visit I agree to the following:  I hereby voluntarily request, consent and authorize [facility] and its employed or contracted physicians, physician assistants, nurse practitioners or other licensed health care professionals (the Practitioner), to provide me with telemedicine health care services (the "Services") as deemed necessary by the treating Practitioner. I acknowledge and consent to receive the Services by the Practitioner via telemedicine. I understand that the telemedicine visit will involve communicating with the Practitioner through live audiovisual communication technology and the disclosure of certain medical information by electronic transmission. I acknowledge that I have been given the opportunity to request an in-person assessment or other available alternative prior to the telemedicine visit and am voluntarily participating in the telemedicine visit.  I understand that I have the right to withhold or withdraw my consent to the use of telemedicine in the course of my care at any time, without affecting my right to future care or treatment, and that the Practitioner or I may terminate the telemedicine visit at any time. I understand that I have the right to inspect all information obtained and/or recorded in the course of the telemedicine visit and may receive copies of available information for a reasonable fee.  I understand that some of the potential risks of receiving the Services via telemedicine include:  Delay or interruption in medical evaluation due to technological equipment failure or disruption; Information transmitted may not be sufficient (e.g. poor  resolution of images) to allow for appropriate medical decision making by the Practitioner; and/or  In rare instances, security protocols could fail, causing a breach of personal health information.  Furthermore, I acknowledge that it is my responsibility to provide information about my medical history, conditions and care that is complete and accurate to the best of my ability. I acknowledge that Practitioner's advice, recommendations, and/or decision may be based on factors not within their control, such as incomplete or inaccurate data provided by me or distortions of diagnostic images or specimens that may result from electronic transmissions. I understand that the practice of medicine is not an exact science and that Practitioner makes no warranties or guarantees regarding treatment outcomes. I acknowledge that a copy of this consent can be made available to me via my patient portal [facility] MyChart), or I can request a printed copy by calling the office of  [facility].    I understand that my insurance will be billed for this visit.   I have read or had this consent read to me. I understand the contents of this consent, which adequately explains the benefits and risks of the Services being provided via telemedicine.  I have been provided ample opportunity to ask questions regarding this consent and the Services and have had my questions answered to my satisfaction. I give my informed consent for the services to be provided through the use of telemedicine in my medical care

## 2023-08-15 ENCOUNTER — Telehealth: Payer: Self-pay | Admitting: Podiatry

## 2023-08-15 NOTE — Telephone Encounter (Signed)
 Sch'ed for tomorrow @ 11:15. And he says thank you sooooooo much!!!!

## 2023-08-15 NOTE — Telephone Encounter (Signed)
 Dr. Michalene Agee  Lidocaine  cream helped only a few days and now not helping. Wants to know if you will ok a cortisone shot and if so I'll call back to get an appointment set at up.   Tired to send a MyChart message but kept sending him to make appt and he wanted to check w/ you 1st.

## 2023-08-16 ENCOUNTER — Ambulatory Visit (INDEPENDENT_AMBULATORY_CARE_PROVIDER_SITE_OTHER): Admitting: Podiatry

## 2023-08-16 ENCOUNTER — Encounter: Payer: Self-pay | Admitting: Podiatry

## 2023-08-16 VITALS — Ht 73.0 in | Wt 209.0 lb

## 2023-08-16 DIAGNOSIS — M7671 Peroneal tendinitis, right leg: Secondary | ICD-10-CM

## 2023-08-16 NOTE — Progress Notes (Signed)
  Subjective:  Patient ID: [name] [name], male    DOB: 02-Mar-1941,  MRN: [id number]  Chief Complaint  Patient presents with   Injections    Pt is here for injection in the right foot due to pain.    Discussed the use of AI scribe software for clinical note transcription with the patient, who gave verbal consent to proceed.  History of Present Illness [name] [name] is a 83 year old male who presents today with pain on the outside of the lower ankle and lateral heel along the peroneal tendons.  No injury there remembers.       Objective:    Physical Exam VASCULAR: DP and PT pulse palpable. Foot is warm and well-perfused. Capillary fill time is brisk. DERMATOLOGIC: Normal skin turgor, texture, and temperature. No open lesions, rashes, or ulcerations. NEUROLOGIC: Normal sensation to light touch and pressure. No paresthesias. ORTHOPEDIC: Smooth pain-free range of motion of all examined joints. No ecchymosis or bruising. No gross deformity. No pain to palpation.  Today has pain on the peroneal tendons and peroneal tubercle. Adducto varus fifth toe with painful corn on lateral fourth PIPJ.    No images are attached to the encounter.    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 Peroneal tendinitis, right      Plan:  Patient was evaluated and treated and all questions answered.  Assessment and Plan Assessment & Plan Peroneal tendinitis Discussed flexion of the peroneal tendons and treatment of this.  He elected for corticosteroid injection today.  Discussed the risk benefits and potential complications of this.  We discussed his activity level following this and should rest.  Following sterile prep with alcohol the peroneal tendon sheath and peroneal tubercle was injected with 10 mg of Kenalog 2 mg of dexamethasone  and 0.5 cc of 0.5% Marcaine plain.  He  tolerated well and was dressed with a Band-Aid.  He will let me know if this does not improve.       Return if symptoms worsen or fail to improve.

## 2023-08-24 ENCOUNTER — Other Ambulatory Visit: Payer: Self-pay | Admitting: Internal Medicine

## 2023-08-25 ENCOUNTER — Ambulatory Visit: Admitting: Family Medicine

## 2023-08-28 ENCOUNTER — Other Ambulatory Visit: Payer: Self-pay | Admitting: Internal Medicine

## 2023-09-07 ENCOUNTER — Ambulatory Visit: Attending: Cardiology | Admitting: Emergency Medicine

## 2023-09-07 DIAGNOSIS — Z01810 Encounter for preprocedural cardiovascular examination: Secondary | ICD-10-CM | POA: Diagnosis not present

## 2023-09-07 NOTE — Progress Notes (Signed)
 Virtual Visit via Telephone Note   Because of [name] [name] co-morbid illnesses, he is at least at moderate risk for complications without adequate follow up.  This format is felt to be most appropriate for this patient at this time.  Due to technical limitations with video connection (technology), today's appointment will be conducted as an audio only telehealth visit, and [name] [name] verbally agreed to proceed in this manner.   All issues noted in this document were discussed and addressed.  No physical exam could be performed with this format.  Evaluation Performed:  Preoperative cardiovascular risk assessment _____________   Date:  09/07/2023   Patient ID:  [name] [name], DOB 12-04-1940, MRN [id number] Patient Location:  Home Provider location:   Office  Primary Care Provider:  Genia Kettering, MD Primary Cardiologist:  Janelle Mediate, MD  Chief Complaint / Patient Profile   83 y.o. y/o male with a h/o coronary artery disease, hypertension, hypothyroid, peripheral vascular disease, RBBB who is pending right hammertoe repair on 09/23/2023 with [name] [name] and presents today for telephonic preoperative cardiovascular risk assessment.  History of Present Illness    [name] [name] is a 83 y.o. male who presents via audio/video conferencing for a telehealth visit today.  Pt was last seen in cardiology clinic on 10/25/2022 by Dr. Stann Earnest.  At that time [name] [name] was doing well.  The patient is now pending procedure as outlined above. Since his last visit, he denies chest pain, shortness of breath, lower extremity edema, fatigue, palpitations, melena, hematuria, hemoptysis, diaphoresis, weakness, presyncope, syncope, orthopnea, and PND.  Overall patient is doing well.  He is without any acute cardiovascular concerns or complaints.  He remains fairly active and continues to work daily.  He remains without any anginal symptoms.  He is able to complete  greater than 4 METS.  Past Medical History    Past Medical History:  Diagnosis Date   Allergic rhinitis    Allergy    Anemia    Arthritis    in neck   CAD (coronary artery disease)    Cataract    Had surg 08/2016   CERUMEN IMPACTION 06/04/2009   5/15     Clotting disorder (HCC)    Colon polyp    Diverticulosis    Family hx of colon cancer    brother   Hyperlipemia    Hypertension    Hypothyroidism    MI (myocardial infarction) (HCC) 1993/2007   no stents   OSA (obstructive sleep apnea)    Psoriasis    Sleep apnea    no cpap- had sleep study per pt no OSA   Past Surgical History:  Procedure Laterality Date   CARDIAC CATHETERIZATION  1993   COLONOSCOPY     POLYPECTOMY     PTCA     TONSILLECTOMY      Allergies  No Known Allergies  Home Medications    Prior to Admission medications   Medication Sig Start Date End Date Taking? Authorizing Provider  acetaminophen  (TYLENOL ) 500 MG tablet Take 500 mg by mouth every 6 (six) hours as needed.    [provider]  aspirin 81 MG tablet Take 81 mg by mouth daily.    [provider]  azelastine  (OPTIVAR ) 0.05 % ophthalmic solution Place 1 drop into both eyes 2 (two) times daily. 11/30/17   Plotnikov, Aleksei V, MD  cholecalciferol (VITAMIN D ) 1000 UNITS tablet Take 4,000 Units by mouth daily.  [provider]  clopidogrel  (PLAVIX ) 75 MG tablet TAKE 1 TABLET BY MOUTH EVERY DAY 01/29/23   Plotnikov, Aleksei V, MD  diclofenac  Sodium (VOLTAREN ) 1 % GEL Apply 1 application topically 4 (four) times daily. 04/16/21   Plotnikov, Aleksei V, MD  halobetasol  (ULTRAVATE ) 0.05 % cream Apply topically 2 (two) times daily. 04/01/20   Plotnikov, Aleksei V, MD  levothyroxine  (SYNTHROID ) 112 MCG tablet TAKE 1 TABLET BY MOUTH EVERY DAY 08/29/23   Plotnikov, Aleksei V, MD  lidocaine  (XYLOCAINE ) 5 % ointment Apply 1 Application topically as needed. 08/02/23   McDonald, Olive Better, DPM  losartan  (COZAAR ) 100 MG tablet TAKE 1  TABLET BY MOUTH EVERY DAY 08/29/23   Plotnikov, Aleksei V, MD  meloxicam  (MOBIC ) 15 MG tablet Take 1 tablet (15 mg total) by mouth daily. 05/17/22   Plotnikov, Aleksei V, MD  metoprolol  succinate (TOPROL -XL) 25 MG 24 hr tablet TAKE 1 TABLET BY MOUTH EVERY DAY 03/21/23   Plotnikov, Aleksei V, MD  Multiple Vitamins-Minerals (CENTRUM SILVER PO) Take 1 tablet by mouth daily.    [provider]  niacin  500 MG CR capsule Take 2 capsules (1,000 mg total) by mouth at bedtime. 07/02/21   Plotnikov, Aleksei V, MD  nitroGLYCERIN  (NITROSTAT ) 0.4 MG SL tablet Place 1 tablet (0.4 mg total) under the tongue every 5 (five) minutes as needed for chest pain (3 doses MAX). 01/23/15   Loyde Rule, MD  Omega-3 Fatty Acids (FISH OIL) 1200 MG CAPS Take 1 capsule by mouth daily.    [provider]  pantoprazole  (PROTONIX ) 40 MG tablet TAKE 1 TABLET BY MOUTH EVERY DAY IN THE MORNING 08/29/23   Plotnikov, Aleksei V, MD  potassium chloride  (KLOR-CON ) 10 MEQ tablet TAKE 1 TABLET BY MOUTH EVERY DAY 08/29/23   Plotnikov, Aleksei V, MD  simvastatin  (ZOCOR ) 40 MG tablet TAKE 1 TABLET BY MOUTH EVERY DAY 08/29/23   Plotnikov, Oakley Bellman, MD    Physical Exam    Vital Signs:  [name] [name] does not have vital signs available for review today.  Given telephonic nature of communication, physical exam is limited. AAOx3. NAD. Normal affect.  Speech and respirations are unlabored.  Accessory Clinical Findings    None  Assessment & Plan    1.  Preoperative Cardiovascular Risk Assessment: According to the Revised Cardiac Risk Index (RCRI), his Perioperative Risk of Major Cardiac Event is (%): 0.4. His Functional Capacity in METs is: 7.34 according to the Duke Activity Status Index (DASI). Therefore, based on ACC/AHA guidelines, patient would be at acceptable risk for the planned procedure without further cardiovascular testing.  The patient was advised that if he develops new symptoms prior to surgery to contact our  office to arrange for a follow-up visit, and he verbalized understanding.  He may hold Plavix  for 5 days prior to procedure. Please resume Plavix  as soon as possible postprocedure, at the discretion of the surgeon.    Regarding ASA therapy, we recommend continuation of ASA throughout the perioperative period.  However, if the surgeon feels that cessation of ASA is required in the perioperative period, it may be stopped 5-7 days prior to surgery with a plan to resume it as soon as felt to be feasible from a surgical standpoint in the post-operative period.   A copy of this note will be routed to requesting surgeon.  Time:   Today, I have spent 7 minutes with the patient with telehealth technology discussing medical history, symptoms, and management plan.  Ava Boatman, NP  09/07/2023, 8:50 AM

## 2023-09-22 ENCOUNTER — Other Ambulatory Visit: Payer: Self-pay | Admitting: Podiatry

## 2023-09-22 MED ORDER — ACETAMINOPHEN 500 MG PO TABS: 1000.0000 mg | ORAL_TABLET | Freq: Four times a day (QID) | ORAL | 0 refills | Status: AC | PRN

## 2023-09-22 MED ORDER — TRAMADOL HCL 50 MG PO TABS: 50.0000 mg | ORAL_TABLET | Freq: Four times a day (QID) | ORAL | 0 refills | Status: AC | PRN

## 2023-09-23 DIAGNOSIS — M2041 Other hammer toe(s) (acquired), right foot: Secondary | ICD-10-CM

## 2023-09-24 ENCOUNTER — Other Ambulatory Visit: Payer: Self-pay | Admitting: Internal Medicine

## 2023-09-29 ENCOUNTER — Ambulatory Visit (INDEPENDENT_AMBULATORY_CARE_PROVIDER_SITE_OTHER): Admitting: Podiatry

## 2023-09-29 ENCOUNTER — Encounter: Payer: Self-pay | Admitting: Podiatry

## 2023-09-29 ENCOUNTER — Ambulatory Visit

## 2023-09-29 ENCOUNTER — Ambulatory Visit (INDEPENDENT_AMBULATORY_CARE_PROVIDER_SITE_OTHER)

## 2023-09-29 DIAGNOSIS — M2041 Other hammer toe(s) (acquired), right foot: Secondary | ICD-10-CM | POA: Diagnosis not present

## 2023-09-29 NOTE — Progress Notes (Signed)
  Subjective:  Patient ID: [name] [name], male    DOB: March 05, 1941,  MRN: [id number]  Chief Complaint  Patient presents with   Routine Post Op    POV # 1 DOS 09/23/23 RT 4TH/5TH HAMMERTOE CORRECTION     83 y.o. male returns for post-op check.  Doing well pain is controlled  Review of Systems: Negative except as noted in the HPI. Denies N/V/F/Ch.   Objective:  There were no vitals filed for this visit. There is no height or weight on file to calculate BMI. Constitutional Well developed. Well nourished.  Vascular Foot warm and well perfused. Capillary refill normal to all digits.  Calf is soft and supple, no posterior calf or knee pain, negative Homans' sign  Neurologic Normal speech. Oriented to person, place, and time. Epicritic sensation to light touch grossly present bilaterally.  Dermatologic Skin healing well without signs of infection. Skin edges well coapted without signs of infection.  Orthopedic: Mild pain and swelling to palpation noted about the surgical site.   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 Hammertoe of right foot    Plan:  Patient was evaluated and treated and all questions answered.  S/p foot surgery right -Progressing as expected post-operatively.  Dressing changed and reapplied.  May remove on Monday and take shower.  May dress with Neosporin and Band-Aid following this.  Use surgical shoe until next visit for ambulation.  Plan to transition to shoe gear after next visit and suture removal  No follow-ups on file.

## 2023-10-11 ENCOUNTER — Ambulatory Visit: Payer: Medicare Other

## 2023-10-11 ENCOUNTER — Ambulatory Visit (INDEPENDENT_AMBULATORY_CARE_PROVIDER_SITE_OTHER): Admitting: Podiatry

## 2023-10-11 DIAGNOSIS — M2041 Other hammer toe(s) (acquired), right foot: Secondary | ICD-10-CM

## 2023-10-12 ENCOUNTER — Ambulatory Visit

## 2023-10-12 VITALS — Ht 73.0 in | Wt 200.0 lb

## 2023-10-12 DIAGNOSIS — Z0000 Encounter for general adult medical examination without abnormal findings: Secondary | ICD-10-CM | POA: Diagnosis not present

## 2023-10-12 NOTE — Patient Instructions (Signed)
 [name] [name] , Thank you for taking time out of your busy schedule to complete your Annual Wellness Visit with me. I enjoyed our conversation and look forward to speaking with you again next year. I, as well as your care team,  appreciate your ongoing commitment to your health goals. Please review the following plan we discussed and let me know if I can assist you in the future. Your Game plan/ To Do List   Follow up Visits: Next Medicare AWV with our clinical staff: 10/12/2024   Have you seen your provider in the last 6 months (3 months if uncontrolled diabetes)? No Next Office Visit with your provider: to be scheduled by the patient  Clinician Recommendations:  Aim for 30 minutes of exercise or brisk walking, 6-8 glasses of water, and 5 servings of fruits and vegetables each day.       This is a list of the screening recommended for you and due dates:  Health Maintenance  Topic Date Due   COVID-19 Vaccine (3 - Pfizer risk series) 06/24/2019   Flu Shot  11/25/2023   Medicare Annual Wellness Visit  10/11/2024   Colon Cancer Screening  09/11/2026   DTaP/Tdap/Td vaccine (3 - Td or Tdap) 10/22/2026   Pneumococcal Vaccine for age over 51  Completed   Zoster (Shingles) Vaccine  Completed   HPV Vaccine  Aged Out   Meningitis B Vaccine  Aged Out    Advanced directives: (Copy Requested) Please bring a copy of your health care power of attorney and living will to the office to be added to your chart at your convenience. You can mail to [facility] [address] [place], [place] [address] or email to [email or web address] Advance Care Planning is important because it:  [x]  Makes sure you receive the medical care that is consistent with your values, goals, and preferences  [x]  It provides guidance to your family and loved ones and reduces their decisional burden about whether or not they are making the right decisions based on your wishes.  Follow the link provided in your  after visit summary or read over the paperwork we have mailed to you to help you started getting your Advance Directives in place. If you need assistance in completing these, please reach out to us  so that we can help you!

## 2023-10-12 NOTE — Progress Notes (Signed)
  Subjective:  Patient ID: [name] [name], male    DOB: [birth year],  MRN: [id number]  Chief Complaint  Patient presents with   Routine Post Op    POV # 2 DOS 09/23/23 RT 4TH/5TH [place]AM[place] CORRECTION Patient is doing very well no pain at all     83 y.o. male returns for post-op check.  Doing well pain is controlled  Review of Systems: Negative except as noted in the HPI. Denies N/V/F/Ch.   Objective:  There were no vitals filed for this visit. There is no height or weight on file to calculate BMI. Constitutional Well developed. Well nourished.  Vascular Foot warm and well perfused. Capillary refill normal to all digits.  Calf is soft and supple, no posterior calf or knee pain, negative Homans' sign  Neurologic Normal speech. Oriented to person, place, and time. Epicritic sensation to light touch grossly present bilaterally.  Dermatologic Skin healing well without signs of infection. Skin edges well coapted without signs of infection.  Orthopedic: No pain pain and mild swelling to palpation noted about the surgical site.   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 Hammertoe of right foot     Plan:  Patient was evaluated and treated and all questions answered.  S/p foot surgery right - Doing well.  Sutures removed uneventfully.  Resume regular bathing.  We discussed the scar and swelling will take some time to fully resolve.  Return to regular shoe gear and activity as tolerated.  Return to see me as needed.  Return if symptoms worsen or fail to improve.

## 2023-10-12 NOTE — Progress Notes (Cosign Needed Addendum)
 Subjective:    [name] [name] is a 83 y.o. who presents for a Medicare Wellness preventive visit.  As a reminder, Annual Wellness Visits don't include a physical exam, and some assessments may be limited, especially if this visit is performed virtually. We may recommend an in-person follow-up visit with your provider if needed.  Visit Complete: Virtual I connected with  [name] [name] on 10/12/23 by a audio enabled telemedicine application and verified that I am speaking with the correct person using two identifiers.  Patient Location: Home  Provider Location: Office/Clinic  I discussed the limitations of evaluation and management by telemedicine. The patient expressed understanding and agreed to proceed.  Vital Signs: Because this visit was a virtual/telehealth visit, some criteria may be missing or patient reported. Any vitals not documented were not able to be obtained and vitals that have been documented are patient reported.  VideoDeclined- This patient declined Librarian, academic. Therefore the visit was completed with audio only.  Persons Participating in Visit: Patient.  AWV Questionnaire: Yes: Patient Medicare AWV questionnaire was completed by the patient on 10/08/2023; I have confirmed that all information answered by patient is correct and no changes since this date.  Cardiac Risk Factors include: advanced age (>70men, >7 women);hypertension;dyslipidemia;male gender     Objective:    Today's Vitals   10/12/23 1233  Weight: 200 lb (90.7 kg)  Height: 6' 1 (1.854 m)   Body mass index is 26.39 kg/m.     10/12/2023   12:32 PM 10/06/2022   10:06 AM 10/02/2021   11:05 AM 09/11/2020   10:48 AM 05/20/2017    9:02 AM 11/11/2016    8:25 AM 02/19/2016    7:28 AM  Advanced Directives  Does Patient Have a Medical Advance Directive? Yes No No No No  No  No   Type of Estate agent of [place];Living will        Copy of  Healthcare Power of Attorney in Chart? No - copy requested        Would patient like information on creating a medical advance directive?  Yes (MAU/Ambulatory/Procedural Areas - Information given) No - Patient declined Yes (MAU/Ambulatory/Procedural Areas - Information given)  No - Patient declined       Data saved with a previous flowsheet row definition    Current Medications (verified) Outpatient Encounter Medications as of 10/12/2023  Medication Sig   acetaminophen  (TYLENOL ) 500 MG tablet Take 500 mg by mouth every 6 (six) hours as needed.   aspirin 81 MG tablet Take 81 mg by mouth daily.   azelastine  (OPTIVAR ) 0.05 % ophthalmic solution Place 1 drop into both eyes 2 (two) times daily.   cholecalciferol (VITAMIN D ) 1000 UNITS tablet Take 4,000 Units by mouth daily.    clopidogrel  (PLAVIX ) 75 MG tablet TAKE 1 TABLET BY MOUTH EVERY DAY   diclofenac  Sodium (VOLTAREN ) 1 % GEL Apply 1 application topically 4 (four) times daily.   halobetasol  (ULTRAVATE ) 0.05 % cream Apply topically 2 (two) times daily.   levothyroxine  (SYNTHROID ) 112 MCG tablet TAKE 1 TABLET BY MOUTH EVERY DAY   lidocaine  (XYLOCAINE ) 5 % ointment Apply 1 Application topically as needed.   losartan  (COZAAR ) 100 MG tablet TAKE 1 TABLET BY MOUTH EVERY DAY   meloxicam  (MOBIC ) 15 MG tablet Take 1 tablet (15 mg total) by mouth daily.   metoprolol  succinate (TOPROL -XL) 25 MG 24 hr tablet TAKE 1 TABLET BY MOUTH EVERY DAY   Multiple Vitamins-Minerals (CENTRUM  SILVER PO) Take 1 tablet by mouth daily.   niacin  500 MG CR capsule Take 2 capsules (1,000 mg total) by mouth at bedtime.   nitroGLYCERIN  (NITROSTAT ) 0.4 MG SL tablet Place 1 tablet (0.4 mg total) under the tongue every 5 (five) minutes as needed for chest pain (3 doses MAX).   Omega-3 Fatty Acids (FISH OIL) 1200 MG CAPS Take 1 capsule by mouth daily.   pantoprazole  (PROTONIX ) 40 MG tablet TAKE 1 TABLET BY MOUTH EVERY DAY IN THE MORNING   potassium chloride  (KLOR-CON ) 10 MEQ  tablet TAKE 1 TABLET BY MOUTH EVERY DAY   simvastatin  (ZOCOR ) 40 MG tablet TAKE 1 TABLET BY MOUTH EVERY DAY   No facility-administered encounter medications on file as of 10/12/2023.    Allergies (verified) Patient has no known allergies.   History: Past Medical History:  Diagnosis Date   Allergic rhinitis    Allergy    Anemia    Arthritis    in neck   CAD (coronary artery disease)    Cataract    Had surg 08/2016   CERUMEN IMPACTION 06/04/2009   5/15     Clotting disorder (HCC)    Colon polyp    Diverticulosis    Family hx of colon cancer    brother   Hyperlipemia    Hypertension    Hypothyroidism    MI (myocardial infarction) (HCC) 1993/2007   no stents   OSA (obstructive sleep apnea)    Psoriasis    Sleep apnea    no cpap- had sleep study per pt no OSA   Past Surgical History:  Procedure Laterality Date   CARDIAC CATHETERIZATION  1993   COLONOSCOPY     POLYPECTOMY     PTCA     TONSILLECTOMY     Family History  Problem Relation Age of Onset   Coronary artery disease Mother    Heart disease Mother    COPD Father    Colon polyps Sister        x2   Colon cancer Brother        died 71   Cancer Brother    Bone cancer Brother    Colon cancer Maternal Aunt    Colon cancer Maternal Aunt    Colon cancer Maternal Uncle    Colon cancer Paternal Aunt    Colon cancer Paternal Grandfather    Hypertension Other    Colon cancer Other    Esophageal cancer Neg Hx    Rectal cancer Neg Hx    Stomach cancer Neg Hx    Pancreatic cancer Neg Hx    Social History   Socioeconomic History   Marital status: Married    Spouse name: Not on file   Number of children: 2   Years of education: Not on file   Highest education level: Bachelor's degree (e.g., BA, AB, BS)  Occupational History   Occupation: retired    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   Tobacco Use   Smoking status: Former    Current packs/day: 0.00    Types: Cigarettes     Quit date: 04/1978    Years since quitting: 45.4   Smokeless tobacco: Never  Vaping Use   Vaping status: Never Used  Substance and Sexual Activity   Alcohol use: Yes    Alcohol/week: 6.0 standard drinks of alcohol    Types: 6 Cans of beer per week    Comment: occas   Drug use: No  Sexual activity: Not Currently  Other Topics Concern   Not on file  Social History Narrative   Not on file   Social Drivers of Health   Financial Resource Strain: Low Risk  (10/12/2023)   Overall Financial Resource Strain (CARDIA)    Difficulty of Paying Living Expenses: Not hard at all  Food Insecurity: No Food Insecurity (10/12/2023)   Hunger Vital Sign    Worried About Running Out of Food in the Last Year: Never true    Ran Out of Food in the Last Year: Never true  Transportation Needs: No Transportation Needs (10/12/2023)   PRAPARE - Administrator, Civil Service (Medical): No    Lack of Transportation (Non-Medical): No  Physical Activity: Sufficiently Active (10/12/2023)   Exercise Vital Sign    Days of Exercise per Week: 3 days    Minutes of Exercise per Session: 50 min  Stress: No Stress Concern Present (10/12/2023)   [employer] of Occupational Health - Occupational Stress Questionnaire    Feeling of Stress: Not at all  Social Connections: Socially Integrated (10/12/2023)   Social Connection and Isolation Panel    Frequency of Communication with Friends and Family: Three times a week    Frequency of Social Gatherings with Friends and Family: More than three times a week    Attends Religious Services: More than 4 times per year    Active Member of [employer] or Organizations: Yes    Attends Engineer, structural: More than 4 times per year    Marital Status: Married    Tobacco Counseling Counseling given: No    Clinical Intake:  Pre-visit preparation completed: Yes  Pain : No/denies pain     BMI - recorded: 26.39 Nutritional Status: BMI 25 -29  Overweight Nutritional Risks: None Diabetes: No  Lab Results  Component Value Date   HGBA1C 5.8 08/28/2013   HGBA1C 6.0 08/20/2011   HGBA1C 5.8 06/08/2010     How often do you need to have someone help you when you read instructions, pamphlets, or other written materials from your doctor or pharmacy?: 1 - Never  Interpreter Needed?: No  Information entered by :: [name] [name], [name] [name]   Activities of Daily Living     10/12/2023   12:35 PM 10/08/2023    2:58 PM  In your present state of health, do you have any difficulty performing the following activities:  Hearing? 0 0  Vision? 0 0  Difficulty concentrating or making decisions? 0 0  Walking or climbing stairs? 0 0  Dressing or bathing? 0 0  Doing errands, shopping? 0 0  Preparing Food and eating ? N N  Using the Toilet? N N  In the past six months, have you accidently leaked urine? N N  Do you have problems with loss of bowel control? N N  Managing your Medications? N N  Managing your Finances? N N  Housekeeping or managing your Housekeeping? N N    Patient Care Team: Plotnikov, Karlynn GAILS, MD as PCP - General Delford Maude BROCKS, MD as PCP - Cardiology (Cardiology) Delford Maude BROCKS, MD (Cardiology) Jakie Alm SAUNDERS, MD as Attending Physician (Gastroenterology) Octavia Charleston, MD (Ophthalmology) Albertus, Gordy HERO, MD as Consulting Physician (Gastroenterology)  I have updated your Care Teams any recent Medical Services you may have received from other providers in the past year.     Assessment:   This is a routine wellness examination for [name] [name].  Hearing/Vision screen Hearing Screening - Comments:: Denies  hearing difficulties   Vision Screening - Comments:: Wears eyeglasses for reading - up to date with routine eye exams with Dr Octavia   Goals Addressed               This Visit's Progress     Patient Stated (pt-stated)        Patient stated he plans to continue to be active.       Depression Screen      10/12/2023   12:38 PM 11/30/2022    2:32 PM 10/06/2022   10:12 AM 09/02/2022    1:43 PM 05/17/2022    1:29 PM 10/02/2021   11:08 AM 04/16/2021   10:24 AM  PHQ 2/9 Scores  PHQ - 2 Score 0 0 0 0 0 0 0  PHQ- 9 Score 0 0 0    0    Fall Risk     10/12/2023   12:37 PM 10/08/2023    2:58 PM 11/30/2022    2:31 PM 10/06/2022   10:07 AM 10/05/2022    2:07 PM  Fall Risk   Falls in the past year? 0 0 0 0 0  Number falls in past yr: 0  0 0 0  Injury with Fall? 0 0 0 0 0  Risk for fall due to : No Fall Risks  No Fall Risks No Fall Risks   Follow up Falls evaluation completed;Falls prevention discussed  Falls evaluation completed Falls prevention discussed     MEDICARE RISK AT HOME:  Medicare Risk at Home Any stairs in or around the home?: Yes If so, are there any without handrails?: No Home free of loose throw rugs in walkways, pet beds, electrical cords, etc?: Yes Adequate lighting in your home to reduce risk of falls?: Yes Life alert?: No Use of a cane, walker or w/c?: No Grab bars in the bathroom?: No Shower chair or bench in shower?: No Elevated toilet seat or a handicapped toilet?: No  TIMED UP AND GO:  Was the test performed?  No  Cognitive Function: 6CIT completed        10/12/2023   12:43 PM 10/06/2022   10:11 AM 10/02/2021   11:19 AM  6CIT Screen  What Year? 0 points 0 points 0 points  What month? 0 points 0 points 0 points  What time? 0 points 0 points 0 points  Count back from 20 0 points 0 points 0 points  Months in reverse 0 points 0 points 0 points  Repeat phrase 0 points 0 points 0 points  Total Score 0 points 0 points 0 points    Immunizations Immunization History  Administered Date(s) Administered   Fluad Quad(high Dose 65+) 03/01/2019, 03/27/2020, 03/12/2021, 03/10/2022   Fluad Trivalent(High Dose 65+) 03/08/2023   Influenza Whole 04/14/2007, 06/05/2010   Influenza, High Dose Seasonal PF 05/14/2016, 02/26/2017   Influenza,inj,Quad PF,6+ Mos 05/16/2014    Influenza,trivalent, recombinat, inj, PF 12/22/2017   PFIZER(Purple Top)SARS-COV-2 Vaccination 05/07/2019, 05/27/2019   PNEUMOCOCCAL CONJUGATE-20 09/02/2022   Pneumococcal Conjugate-13 08/28/2013   Pneumococcal Polysaccharide-23 06/04/2009   Td 08/16/2011   Tdap 10/21/2016   Zoster Recombinant(Shingrix) 03/11/2017, 05/24/2017   Zoster, Live 06/05/2010    Screening Tests Health Maintenance  Topic Date Due   COVID-19 Vaccine (3 - Pfizer risk series) 06/24/2019   INFLUENZA VACCINE  11/25/2023   Medicare Annual Wellness (AWV)  10/11/2024   Colonoscopy  09/11/2026   DTaP/Tdap/Td (3 - Td or Tdap) 10/22/2026   Pneumococcal Vaccine: 50+ Years  Completed  Zoster Vaccines- Shingrix  Completed   HPV VACCINES  Aged Out   Meningococcal B Vaccine  Aged Out    Health Maintenance  Health Maintenance Due  Topic Date Due   COVID-19 Vaccine (3 - Pfizer risk series) 06/24/2019   Health Maintenance Items Addressed:10/12/2023   Additional Screening:  Vision Screening: Recommended annual ophthalmology exams for early detection of glaucoma and other disorders of the eye. Would you like a referral to an eye doctor? No    Dental Screening: Recommended annual dental exams for proper oral hygiene  Community Resource Referral / Chronic Care Management: CRR required this visit?  No   CCM required this visit?  No   Plan:    I have personally reviewed and noted the following in the patient's chart:   Medical and social history Use of alcohol, tobacco or illicit drugs  Current medications and supplements including opioid prescriptions. Patient is not currently taking opioid prescriptions. Functional ability and status Nutritional status Physical activity Advanced directives List of other physicians Hospitalizations, surgeries, and ER visits in previous 12 months Vitals Screenings to include cognitive, depression, and falls Referrals and appointments  In addition, I have reviewed and  discussed with patient certain preventive protocols, quality metrics, and best practice recommendations. A written personalized care plan for preventive services as well as general preventive health recommendations were provided to patient.   [name] [name], [name] [name]   10/12/2023   After Visit Summary: (MyChart) Due to this being a telephonic visit, the after visit summary with patients personalized plan was offered to patient via MyChart   Notes: Nothing significant to report at this time.   Medical screening examination/treatment/procedure(s) were performed by non-physician practitioner and as supervising physician I was immediately available for consultation/collaboration.  I agree with above. Karlynn Noel, MD

## 2023-11-01 ENCOUNTER — Encounter: Admitting: Podiatry

## 2023-11-16 DIAGNOSIS — H02834 Dermatochalasis of left upper eyelid: Secondary | ICD-10-CM | POA: Diagnosis not present

## 2023-11-16 DIAGNOSIS — H26493 Other secondary cataract, bilateral: Secondary | ICD-10-CM | POA: Diagnosis not present

## 2023-11-16 DIAGNOSIS — H43811 Vitreous degeneration, right eye: Secondary | ICD-10-CM | POA: Diagnosis not present

## 2023-11-16 DIAGNOSIS — H02831 Dermatochalasis of right upper eyelid: Secondary | ICD-10-CM | POA: Diagnosis not present

## 2023-11-16 DIAGNOSIS — Z961 Presence of intraocular lens: Secondary | ICD-10-CM | POA: Diagnosis not present

## 2023-11-16 DIAGNOSIS — H353111 Nonexudative age-related macular degeneration, right eye, early dry stage: Secondary | ICD-10-CM | POA: Diagnosis not present

## 2023-11-23 ENCOUNTER — Other Ambulatory Visit: Payer: Self-pay | Admitting: Internal Medicine

## 2024-01-15 ENCOUNTER — Other Ambulatory Visit: Payer: Self-pay | Admitting: Internal Medicine

## 2024-04-06 ENCOUNTER — Ambulatory Visit: Payer: Self-pay

## 2024-04-06 NOTE — Telephone Encounter (Signed)
 FYI Only or Action Required?: FYI only for provider: appointment scheduled on 12/15.  Patient was last seen in primary care on 11/30/2022 by Plotnikov, Karlynn GAILS, MD.  Called Nurse Triage reporting Dizziness.  Symptoms began several months ago.  Interventions attempted: Rest, hydration, or home remedies.  Symptoms are: unchanged.  Triage Disposition: See PCP When Office is Open (Within 3 Days)  Patient/caregiver understands and will follow disposition?: Yes  Copied from CRM #[id number]. Topic: Clinical - Red Word Triage >> Apr 06, 2024  9:16 AM Harlene ORN wrote: Red Word that prompted transfer to Nurse Triage: when he stands up, he is lightheaded and feels faint when standing. been going on for 6 months. Ongoing and not going away. Reason for Disposition  [1] MILD dizziness (e.g., walking normally) AND [2] has NOT been evaluated by doctor (or NP/PA) for this  (Exception: Dizziness caused by heat exposure, sudden standing, or poor fluid intake.)  Answer Assessment - Initial Assessment Questions Pt with reported hx of 6 months of Dizzy spells that hit him at random times when he goes to stand up from sitting. There has not been a consistent pattern to when it comes on. He denies CP, SOB, Fever, vision changes, balance changes. He sits down when it hits so he does not fall or pass out. It resolves within a minute or two. Endorses good hydration. He has not missed any doses of his medications and reports no new meds.   Also with random nerve pain in his head. Will hit in one spot for 5-10 seconds and the next time it hits it in another spot on his head. It could occur again in a few minutes or it could be weeks. Dr Garald did CT head 11/2022 for this and no acute findings. It has continued.  Appt with pcp office 12/15 and understands ED/UC precautions.   1. DESCRIPTION: Describe your dizziness.     Light headed  2. LIGHTHEADED: Do you feel lightheaded? (e.g., somewhat faint, woozy,  weak upon standing)     Somewhat faint  3. VERTIGO: Do you feel like either you or the room is spinning or tilting? (i.e., vertigo)     Denies spinning 4. SEVERITY: How bad is it?  Do you feel like you are going to faint? Can you stand and walk?     Sometimes when he stands but not all the time 5. ONSET:  When did the dizziness begin?     6 months ago  6. AGGRAVATING FACTORS: Does anything make it worse? (e.g., standing, change in head position)     standing 7. HEART RATE: Can you tell me your heart rate? How many beats in 15 seconds?  (Note: Not all patients can do this.)       Denies-- low resting HR in the 50's 8. CAUSE: What do you think is causing the dizziness? (e.g., decreased fluids or food, diarrhea, emotional distress, heat exposure, new medicine, sudden standing, vomiting; unknown)     Unsure if Vit D levels, or BP 9. RECURRENT SYMPTOM: Have you had dizziness before? If Yes, ask: When was the last time? What happened that time?     For the last 6 months- randomly  10. OTHER SYMPTOMS: Do you have any other symptoms? (e.g., fever, chest pain, vomiting, diarrhea, bleeding)       Denies  Answer Assessment - Initial Assessment Questions 1. LOCATION: Where does it hurt?      One spot but it changes every time.  2.  ONSET: When did the headache start? (e.g., minutes, hours, days)      May 2024 3. PATTERN: Does the pain come and go, or has it been constant since it started?     Last for 5-10 seconds and can reoccur in a few hours and sometimes not for days or weeks  4. SEVERITY: How bad is the pain? and What does it keep you from doing?  (e.g., Scale 1-10; mild, moderate, or severe)     Dull nerve sensation- not even really a headache 5. RECURRENT SYMPTOM: Have you ever had headaches before? If Yes, ask: When was the last time? and What happened that time?      May 2024 it started  6. CAUSE: What do you think is causing the headache?      Unknown- CT negative  7. MIGRAINE: Have you been diagnosed with migraine headaches? If Yes, ask: Is this headache similar?      denies 8. HEAD INJURY: Has there been any recent injury to your head?      denies 9. OTHER SYMPTOMS: Do you have any other symptoms? (e.g., fever, stiff neck, eye pain, sore throat, cold symptoms)     Denies  Protocols used: Dizziness - Lightheadedness-A-AH, Headache-A-AH

## 2024-04-09 ENCOUNTER — Ambulatory Visit: Admitting: Family Medicine

## 2024-04-09 ENCOUNTER — Encounter: Payer: Self-pay | Admitting: Family Medicine

## 2024-04-09 VITALS — BP 134/79 | HR 69 | Temp 97.8°F | Resp 18 | Ht 73.0 in | Wt 204.0 lb

## 2024-04-09 DIAGNOSIS — Z23 Encounter for immunization: Secondary | ICD-10-CM

## 2024-04-09 DIAGNOSIS — R42 Dizziness and giddiness: Secondary | ICD-10-CM

## 2024-04-09 DIAGNOSIS — H6123 Impacted cerumen, bilateral: Secondary | ICD-10-CM

## 2024-04-09 DIAGNOSIS — E559 Vitamin D deficiency, unspecified: Secondary | ICD-10-CM | POA: Diagnosis not present

## 2024-04-09 DIAGNOSIS — R7309 Other abnormal glucose: Secondary | ICD-10-CM | POA: Diagnosis not present

## 2024-04-09 NOTE — Assessment & Plan Note (Signed)
 Ear irrigation successful. Patient tolerated well.

## 2024-04-09 NOTE — Progress Notes (Signed)
 Assessment & Plan Dizziness Dizziness primarily on standing, with near-fainting. No orthostatic hypotension. Possible dehydration from high caffeine and low water intake. - Recommended to decrease coffee intake and increase water consumption. - Recently had blood work that did not show any underlying causes.    Bilateral impacted cerumen Ear irrigation successful. Patient tolerated well.    Vitamin D  deficiency  Orders:   VITAMIN D  25 Hydroxy (Vit-D Deficiency, Fractures); Future  Elevated glucose Slightly elevated glucose levels previously noted. Orders:   Basic metabolic panel with GFR; Future  Immunization due  Orders:   Flu vaccine HIGH DOSE PF(Fluzone Trivalent)    Follow up plan: Return in about 21 weeks (around 09/03/2024) for chronic follow-up with PCP.  Niki Rung, MSN, APRN, FNP-C  Subjective:  HPI: [name] [name] is a 83 y.o. male presenting on 04/09/2024 for Dizziness (Vertigo with standing and quick movements - noticed for about 6 mos. - about the same, not any worse. )  Discussed the use of AI scribe software for clinical note transcription with the patient, who gave verbal consent to proceed.  Patient experiences dizziness primarily when standing up quickly, described as a sensation where everything 'starts looking like it's not real' and he feels as though he is going to faint if he does not sit back down. He has not actually fainted but feels very close to it. The dizziness does not occur with quick head movements or when rolling over in bed. No chest pain, shortness of breath, palpitations, or headaches.  He drinks a limited amount of water, typically a 20-ounce bottle at lunch, but consumes ten cups of half-caffeinated coffee daily, starting early in the morning and continuing throughout the day.  He takes a vitamin D  supplement.   His last lab work showed a slightly elevated glucose level.       ROS: Negative unless specifically indicated  above in HPI.   Relevant past medical history reviewed and updated as indicated.   Allergies and medications reviewed and updated.  Current Medications[1]  Allergies[2]  Objective:   BP 134/79 (BP Location: Left Arm, Patient Position: Standing, Cuff Size: Large)   Pulse 69   Temp 97.8 F (36.6 C)   Resp 18   Ht 6' 1 (1.854 m)   Wt 204 lb (92.5 kg)   SpO2 99%   BMI 26.91 kg/m    Physical Exam Vitals reviewed.  Constitutional:      General: He is not in acute distress.    Appearance: Normal appearance. He is not ill-appearing, toxic-appearing or diaphoretic.  HENT:     Head: Normocephalic and atraumatic.     Right Ear: Tympanic membrane, ear canal and external ear normal. There is impacted cerumen.     Left Ear: Tympanic membrane, ear canal and external ear normal. There is impacted cerumen.  Eyes:     General: No scleral icterus.       Right eye: No discharge.        Left eye: No discharge.     Extraocular Movements: Extraocular movements intact.     Conjunctiva/sclera: Conjunctivae normal.     Pupils: Pupils are equal, round, and reactive to light.  Neck:     Vascular: Carotid bruit (mild) present.  Cardiovascular:     Rate and Rhythm: Normal rate and regular rhythm.     Heart sounds: Normal heart sounds. No murmur heard.    No friction rub. No gallop.  Pulmonary:     Effort: Pulmonary effort is  normal. No respiratory distress.     Breath sounds: Normal breath sounds. No stridor. No wheezing, rhonchi or rales.  Musculoskeletal:        General: Normal range of motion.     Cervical back: Normal range of motion.  Skin:    General: Skin is warm and dry.  Neurological:     Mental Status: He is alert and oriented to person, place, and time. Mental status is at baseline.     Motor: Motor function is intact.     Gait: Gait is intact.  Psychiatric:        Mood and Affect: Mood normal.        Behavior: Behavior normal.        Thought Content: Thought content normal.         Judgment: Judgment normal.    Ear Cerumen Removal  Date/Time: 04/09/2024 9:55 AM  Performed by: Merlynn Niki FALCON, FNP Authorized by: Merlynn Niki FALCON, FNP   Anesthesia: Local Anesthetic: none Location details: right ear and left ear Patient tolerance: patient tolerated the procedure well with no immediate complications Procedure type: irrigation  Sedation: Patient sedated: no             [1]  Current Outpatient Medications:    acetaminophen  (TYLENOL ) 500 MG tablet, Take 500 mg by mouth every 6 (six) hours as needed., Disp: , Rfl:    aspirin 81 MG tablet, Take 81 mg by mouth daily., Disp: , Rfl:    azelastine  (OPTIVAR ) 0.05 % ophthalmic solution, Place 1 drop into both eyes 2 (two) times daily., Disp: 6 mL, Rfl: 6   cholecalciferol (VITAMIN D ) 1000 UNITS tablet, Take 4,000 Units by mouth daily. , Disp: , Rfl:    clopidogrel  (PLAVIX ) 75 MG tablet, TAKE 1 TABLET BY MOUTH EVERY DAY, Disp: 90 tablet, Rfl: 3   halobetasol  (ULTRAVATE ) 0.05 % cream, Apply topically 2 (two) times daily., Disp: 50 g, Rfl: 2   levothyroxine  (SYNTHROID ) 112 MCG tablet, TAKE 1 TABLET BY MOUTH EVERY DAY, Disp: 30 tablet, Rfl: 11   lidocaine  (XYLOCAINE ) 5 % ointment, Apply 1 Application topically as needed., Disp: 35.44 g, Rfl: 0   losartan  (COZAAR ) 100 MG tablet, TAKE 1 TABLET BY MOUTH EVERY DAY, Disp: 90 tablet, Rfl: 3   meloxicam  (MOBIC ) 15 MG tablet, Take 1 tablet (15 mg total) by mouth daily., Disp: 15 tablet, Rfl: 0   metoprolol  succinate (TOPROL -XL) 25 MG 24 hr tablet, TAKE 1 TABLET BY MOUTH EVERY DAY, Disp: 90 tablet, Rfl: 2   Multiple Vitamins-Minerals (CENTRUM SILVER PO), Take 1 tablet by mouth daily., Disp: , Rfl:    niacin  500 MG CR capsule, Take 2 capsules (1,000 mg total) by mouth at bedtime., Disp: 100 capsule, Rfl: 3   nitroGLYCERIN  (NITROSTAT ) 0.4 MG SL tablet, Place 1 tablet (0.4 mg total) under the tongue every 5 (five) minutes as needed for chest pain (3 doses MAX)., Disp: 25  tablet, Rfl: 4   Omega-3 Fatty Acids (FISH OIL) 1200 MG CAPS, Take 1 capsule by mouth daily., Disp: , Rfl:    pantoprazole  (PROTONIX ) 40 MG tablet, TAKE 1 TABLET BY MOUTH EVERY DAY IN THE MORNING, Disp: 90 tablet, Rfl: 3   potassium chloride  (KLOR-CON ) 10 MEQ tablet, TAKE 1 TABLET BY MOUTH EVERY DAY, Disp: 90 tablet, Rfl: 3   simvastatin  (ZOCOR ) 40 MG tablet, TAKE 1 TABLET BY MOUTH EVERY DAY, Disp: 90 tablet, Rfl: 3   diclofenac  Sodium (VOLTAREN ) 1 % GEL, Apply 1 application topically 4 (  four) times daily. (Patient not taking: Reported on 04/09/2024), Disp: 100 g, Rfl: 3 [2] No Known Allergies

## 2024-04-10 ENCOUNTER — Other Ambulatory Visit

## 2024-04-10 DIAGNOSIS — R7309 Other abnormal glucose: Secondary | ICD-10-CM

## 2024-04-10 DIAGNOSIS — E559 Vitamin D deficiency, unspecified: Secondary | ICD-10-CM | POA: Diagnosis not present

## 2024-04-10 LAB — BASIC METABOLIC PANEL WITH GFR
BUN: 14 mg/dL (ref 6–23)
CO2: 28 meq/L (ref 19–32)
Calcium: 9.5 mg/dL (ref 8.4–10.5)
Chloride: 102 meq/L (ref 96–112)
Creatinine, Ser: 1.05 mg/dL (ref 0.40–1.50)
GFR: 65.87 mL/min (ref >60.00)
Glucose, Bld: 98 mg/dL (ref 70–99)
Potassium: 4.1 meq/L (ref 3.5–5.1)
Sodium: 138 meq/L (ref 135–145)

## 2024-04-10 LAB — VITAMIN D 25 HYDROXY (VIT D DEFICIENCY, FRACTURES): VITD: 56.59 ng/mL (ref 30.00–100.00)

## 2024-04-11 ENCOUNTER — Ambulatory Visit: Payer: Self-pay | Admitting: Family Medicine

## 2024-06-19 ENCOUNTER — Ambulatory Visit: Admitting: Cardiovascular Disease

## 2024-10-12 ENCOUNTER — Ambulatory Visit
